# Patient Record
Sex: Female | Born: 1965 | Race: Black or African American | Hispanic: No | Marital: Single | Smoking: Current every day smoker
Health system: Southern US, Community
[De-identification: ages and names within clinical notes are randomized; demographics above are authoritative.]

## PROBLEM LIST (undated history)

## (undated) DIAGNOSIS — N393 Stress incontinence (female) (male): Secondary | ICD-10-CM

## (undated) DIAGNOSIS — Z720 Tobacco use: Secondary | ICD-10-CM

## (undated) DIAGNOSIS — I10 Essential (primary) hypertension: Secondary | ICD-10-CM

## (undated) DIAGNOSIS — J302 Other seasonal allergic rhinitis: Secondary | ICD-10-CM

## (undated) DIAGNOSIS — J45909 Unspecified asthma, uncomplicated: Secondary | ICD-10-CM

## (undated) HISTORY — DX: Tobacco use: Z72.0

## (undated) HISTORY — DX: Other seasonal allergic rhinitis: J30.2

## (undated) HISTORY — DX: Stress incontinence (female) (male): N39.3

## (undated) HISTORY — DX: Unspecified asthma, uncomplicated: J45.909

## (undated) HISTORY — DX: Essential (primary) hypertension: I10

---

## 1999-11-06 ENCOUNTER — Inpatient Hospital Stay (HOSPITAL_COMMUNITY): Admission: AD | Admit: 1999-11-06 | Discharge: 1999-11-06 | Payer: Self-pay | Admitting: *Deleted

## 1999-11-06 ENCOUNTER — Encounter (INDEPENDENT_AMBULATORY_CARE_PROVIDER_SITE_OTHER): Payer: Self-pay | Admitting: Specialist

## 1999-11-08 ENCOUNTER — Inpatient Hospital Stay (HOSPITAL_COMMUNITY): Admission: AD | Admit: 1999-11-08 | Discharge: 1999-11-08 | Payer: Self-pay | Admitting: Obstetrics & Gynecology

## 1999-11-23 ENCOUNTER — Inpatient Hospital Stay (HOSPITAL_COMMUNITY): Admission: AD | Admit: 1999-11-23 | Discharge: 1999-11-23 | Payer: Self-pay | Admitting: *Deleted

## 2007-04-08 ENCOUNTER — Ambulatory Visit: Payer: Self-pay | Admitting: Internal Medicine

## 2007-04-08 ENCOUNTER — Encounter (INDEPENDENT_AMBULATORY_CARE_PROVIDER_SITE_OTHER): Payer: Self-pay | Admitting: Nurse Practitioner

## 2007-04-08 LAB — CONVERTED CEMR LAB
ALT: 28 units/L (ref 0–35)
AST: 23 units/L (ref 0–37)
Basophils Absolute: 0 10*3/uL (ref 0.0–0.1)
Basophils Relative: 1 % (ref 0–1)
Chloride: 103 meq/L (ref 96–112)
Creatinine, Ser: 0.75 mg/dL (ref 0.40–1.20)
Eosinophils Relative: 2 % (ref 0–5)
Hemoglobin: 14.1 g/dL (ref 12.0–15.0)
MCHC: 32.8 g/dL (ref 30.0–36.0)
Monocytes Absolute: 0.5 10*3/uL (ref 0.1–1.0)
Neutro Abs: 4 10*3/uL (ref 1.7–7.7)
Platelets: 404 10*3/uL — ABNORMAL HIGH (ref 150–400)
RDW: 14.9 % (ref 11.5–15.5)
Sodium: 141 meq/L (ref 135–145)
Total Bilirubin: 0.3 mg/dL (ref 0.3–1.2)
Total Protein: 7.9 g/dL (ref 6.0–8.3)

## 2007-04-22 ENCOUNTER — Ambulatory Visit (HOSPITAL_COMMUNITY): Admission: RE | Admit: 2007-04-22 | Discharge: 2007-04-22 | Payer: Self-pay | Admitting: Family Medicine

## 2007-05-12 ENCOUNTER — Ambulatory Visit: Payer: Self-pay | Admitting: Family Medicine

## 2007-07-29 ENCOUNTER — Other Ambulatory Visit: Admission: RE | Admit: 2007-07-29 | Discharge: 2007-07-29 | Payer: Self-pay | Admitting: Internal Medicine

## 2007-07-29 ENCOUNTER — Encounter: Payer: Self-pay | Admitting: Internal Medicine

## 2007-07-29 ENCOUNTER — Ambulatory Visit: Payer: Self-pay | Admitting: Internal Medicine

## 2007-08-04 ENCOUNTER — Ambulatory Visit: Payer: Self-pay | Admitting: Internal Medicine

## 2007-10-26 ENCOUNTER — Ambulatory Visit: Payer: Self-pay | Admitting: Internal Medicine

## 2007-10-30 ENCOUNTER — Ambulatory Visit: Payer: Self-pay | Admitting: Family Medicine

## 2007-12-07 ENCOUNTER — Ambulatory Visit: Payer: Self-pay | Admitting: Internal Medicine

## 2008-11-15 ENCOUNTER — Encounter (INDEPENDENT_AMBULATORY_CARE_PROVIDER_SITE_OTHER): Payer: Self-pay | Admitting: Internal Medicine

## 2008-11-15 ENCOUNTER — Ambulatory Visit: Payer: Self-pay | Admitting: Internal Medicine

## 2008-11-15 ENCOUNTER — Other Ambulatory Visit: Admission: RE | Admit: 2008-11-15 | Discharge: 2008-11-15 | Payer: Self-pay | Admitting: Internal Medicine

## 2008-11-15 LAB — CONVERTED CEMR LAB
ALT: 18 units/L (ref 0–35)
BUN: 15 mg/dL (ref 6–23)
CO2: 24 meq/L (ref 19–32)
Calcium: 9.4 mg/dL (ref 8.4–10.5)
Chloride: 99 meq/L (ref 96–112)
Creatinine, Ser: 0.69 mg/dL (ref 0.40–1.20)
Eosinophils Absolute: 0.3 10*3/uL (ref 0.0–0.7)
Eosinophils Relative: 3 % (ref 0–5)
Glucose, Bld: 91 mg/dL (ref 70–99)
HCT: 39.3 % (ref 36.0–46.0)
Hemoglobin: 13.1 g/dL (ref 12.0–15.0)
Lymphs Abs: 2.9 10*3/uL (ref 0.7–4.0)
MCV: 83.6 fL (ref 78.0–100.0)
Monocytes Absolute: 0.7 10*3/uL (ref 0.1–1.0)
Monocytes Relative: 7 % (ref 3–12)
RBC: 4.7 M/uL (ref 3.87–5.11)
WBC: 10.4 10*3/uL (ref 4.0–10.5)

## 2008-11-16 ENCOUNTER — Encounter (INDEPENDENT_AMBULATORY_CARE_PROVIDER_SITE_OTHER): Payer: Self-pay | Admitting: Internal Medicine

## 2008-12-06 ENCOUNTER — Ambulatory Visit (HOSPITAL_COMMUNITY): Admission: RE | Admit: 2008-12-06 | Discharge: 2008-12-06 | Payer: Self-pay | Admitting: Internal Medicine

## 2008-12-07 ENCOUNTER — Ambulatory Visit: Payer: Self-pay | Admitting: Internal Medicine

## 2009-02-09 ENCOUNTER — Ambulatory Visit: Payer: Self-pay | Admitting: Family Medicine

## 2009-03-08 ENCOUNTER — Ambulatory Visit: Payer: Self-pay | Admitting: Family Medicine

## 2009-03-08 ENCOUNTER — Encounter (INDEPENDENT_AMBULATORY_CARE_PROVIDER_SITE_OTHER): Payer: Self-pay | Admitting: Internal Medicine

## 2009-03-08 LAB — CONVERTED CEMR LAB
Cholesterol: 217 mg/dL — ABNORMAL HIGH (ref 0–200)
Total CHOL/HDL Ratio: 6

## 2009-03-13 ENCOUNTER — Ambulatory Visit: Payer: Self-pay | Admitting: Internal Medicine

## 2009-05-16 ENCOUNTER — Ambulatory Visit: Payer: Self-pay | Admitting: Internal Medicine

## 2009-06-09 ENCOUNTER — Ambulatory Visit: Payer: Self-pay | Admitting: Internal Medicine

## 2010-03-02 ENCOUNTER — Other Ambulatory Visit (HOSPITAL_COMMUNITY): Payer: Self-pay | Admitting: Family Medicine

## 2010-03-02 DIAGNOSIS — Z1231 Encounter for screening mammogram for malignant neoplasm of breast: Secondary | ICD-10-CM

## 2010-03-20 ENCOUNTER — Ambulatory Visit (HOSPITAL_COMMUNITY): Payer: Medicaid Other

## 2010-03-28 ENCOUNTER — Ambulatory Visit (HOSPITAL_COMMUNITY): Payer: Medicaid Other

## 2010-04-05 ENCOUNTER — Ambulatory Visit (HOSPITAL_COMMUNITY)
Admission: RE | Admit: 2010-04-05 | Discharge: 2010-04-05 | Payer: Medicaid Other | Source: Ambulatory Visit | Attending: Family Medicine | Admitting: Family Medicine

## 2010-08-03 ENCOUNTER — Other Ambulatory Visit: Payer: Self-pay | Admitting: Family Medicine

## 2010-08-15 ENCOUNTER — Ambulatory Visit (HOSPITAL_COMMUNITY): Payer: Medicaid Other | Attending: Family Medicine

## 2010-09-20 ENCOUNTER — Ambulatory Visit (HOSPITAL_COMMUNITY): Payer: Medicaid Other

## 2010-09-28 ENCOUNTER — Ambulatory Visit (HOSPITAL_COMMUNITY): Payer: Medicaid Other

## 2010-10-03 ENCOUNTER — Ambulatory Visit (HOSPITAL_COMMUNITY)
Admission: RE | Admit: 2010-10-03 | Discharge: 2010-10-03 | Disposition: A | Discharge status: Home / Self Care | Payer: Medicaid Other | Source: Ambulatory Visit | Attending: Family Medicine | Admitting: Family Medicine

## 2010-10-03 DIAGNOSIS — Z1231 Encounter for screening mammogram for malignant neoplasm of breast: Secondary | ICD-10-CM

## 2012-01-10 ENCOUNTER — Other Ambulatory Visit: Payer: Self-pay | Admitting: Internal Medicine

## 2012-01-10 DIAGNOSIS — Z1231 Encounter for screening mammogram for malignant neoplasm of breast: Secondary | ICD-10-CM

## 2012-02-10 ENCOUNTER — Ambulatory Visit: Payer: Medicaid Other

## 2012-03-12 ENCOUNTER — Ambulatory Visit: Payer: Medicaid Other

## 2012-03-15 ENCOUNTER — Emergency Department (HOSPITAL_COMMUNITY)
Admission: EM | Admit: 2012-03-15 | Discharge: 2012-03-15 | Disposition: A | Discharge status: Home / Self Care | Payer: No Typology Code available for payment source | Attending: Emergency Medicine | Admitting: Emergency Medicine

## 2012-03-15 ENCOUNTER — Encounter (HOSPITAL_COMMUNITY): Payer: Self-pay | Admitting: Emergency Medicine

## 2012-03-15 DIAGNOSIS — Y929 Unspecified place or not applicable: Secondary | ICD-10-CM | POA: Insufficient documentation

## 2012-03-15 DIAGNOSIS — I10 Essential (primary) hypertension: Secondary | ICD-10-CM | POA: Insufficient documentation

## 2012-03-15 DIAGNOSIS — Z79899 Other long term (current) drug therapy: Secondary | ICD-10-CM | POA: Insufficient documentation

## 2012-03-15 DIAGNOSIS — IMO0002 Reserved for concepts with insufficient information to code with codable children: Secondary | ICD-10-CM | POA: Insufficient documentation

## 2012-03-15 DIAGNOSIS — F17200 Nicotine dependence, unspecified, uncomplicated: Secondary | ICD-10-CM | POA: Insufficient documentation

## 2012-03-15 DIAGNOSIS — S79929A Unspecified injury of unspecified thigh, initial encounter: Secondary | ICD-10-CM | POA: Insufficient documentation

## 2012-03-15 DIAGNOSIS — Y9389 Activity, other specified: Secondary | ICD-10-CM | POA: Insufficient documentation

## 2012-03-15 DIAGNOSIS — S79919A Unspecified injury of unspecified hip, initial encounter: Secondary | ICD-10-CM | POA: Insufficient documentation

## 2012-03-15 HISTORY — DX: Essential (primary) hypertension: I10

## 2012-03-15 MED ORDER — METHOCARBAMOL 500 MG PO TABS: 1000.0000 mg | ORAL_TABLET | Freq: Four times a day (QID) | ORAL | Status: DC
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
## 2012-03-17 ENCOUNTER — Ambulatory Visit: Payer: Medicaid Other | Admitting: Internal Medicine

## 2012-03-17 NOTE — ED Provider Notes (Signed)
Medical screening examination/treatment/procedure(s) were performed by non-physician practitioner and as supervising physician I was immediately available for consultation/collaboration.  Hurman Horn, MD 03/17/12 [phone]

## 2012-03-30 ENCOUNTER — Ambulatory Visit (INDEPENDENT_AMBULATORY_CARE_PROVIDER_SITE_OTHER): Payer: Medicaid Other | Admitting: Internal Medicine

## 2012-03-30 ENCOUNTER — Encounter: Payer: Self-pay | Admitting: Internal Medicine

## 2012-03-30 VITALS — BP 130/73 | HR 100 | Temp 97.8°F | Wt 248.2 lb

## 2012-03-30 DIAGNOSIS — F17200 Nicotine dependence, unspecified, uncomplicated: Secondary | ICD-10-CM

## 2012-03-30 DIAGNOSIS — E785 Hyperlipidemia, unspecified: Secondary | ICD-10-CM

## 2012-03-30 DIAGNOSIS — I10 Essential (primary) hypertension: Secondary | ICD-10-CM

## 2012-03-30 DIAGNOSIS — J309 Allergic rhinitis, unspecified: Secondary | ICD-10-CM

## 2012-03-30 DIAGNOSIS — J45909 Unspecified asthma, uncomplicated: Secondary | ICD-10-CM

## 2012-03-30 DIAGNOSIS — N393 Stress incontinence (female) (male): Secondary | ICD-10-CM

## 2012-03-30 DIAGNOSIS — F17210 Nicotine dependence, cigarettes, uncomplicated: Secondary | ICD-10-CM | POA: Insufficient documentation

## 2012-03-30 DIAGNOSIS — Z720 Tobacco use: Secondary | ICD-10-CM

## 2012-03-30 DIAGNOSIS — J302 Other seasonal allergic rhinitis: Secondary | ICD-10-CM

## 2012-03-30 HISTORY — DX: Tobacco use: Z72.0

## 2012-03-30 HISTORY — DX: Other seasonal allergic rhinitis: J30.2

## 2012-03-30 HISTORY — DX: Stress incontinence (female) (male): N39.3

## 2012-03-30 HISTORY — DX: Essential (primary) hypertension: I10

## 2012-03-30 HISTORY — DX: Unspecified asthma, uncomplicated: J45.909

## 2012-03-30 LAB — LIPID PANEL
Cholesterol: 172 mg/dL (ref 0–200)
Total CHOL/HDL Ratio: 5.4 Ratio
VLDL: 26 mg/dL (ref 0–40)

## 2012-03-30 LAB — CBC
Hemoglobin: 12.4 g/dL (ref 12.0–15.0)
MCHC: 32.4 g/dL (ref 30.0–36.0)
RDW: 17.2 % — ABNORMAL HIGH (ref 11.5–15.5)

## 2012-03-30 LAB — BASIC METABOLIC PANEL WITH GFR
BUN: 10 mg/dL (ref 6–23)
Creat: 0.69 mg/dL (ref 0.50–1.10)
GFR, Est African American: >89 mL/min
GFR, Est Non African American: >89 mL/min
Potassium: 4.5 mEq/L (ref 3.5–5.3)

## 2012-03-30 MED ORDER — HYDROCODONE-ACETAMINOPHEN 5-325 MG PO TABS: 1.0000 | ORAL_TABLET | Freq: Every evening | ORAL | Status: DC | PRN

## 2012-03-30 MED ORDER — TRAMADOL HCL 50 MG PO TABS: 50.0000 mg | ORAL_TABLET | Freq: Three times a day (TID) | ORAL | Status: DC

## 2012-03-30 NOTE — Assessment & Plan Note (Signed)
Breathing is well controlled at this time with symbicort daily and albuterol prn. Consider PFTs in the future.

## 2012-03-30 NOTE — Progress Notes (Signed)
Subjective:     Patient ID: Crystal Briggs, female   DOB: 12/21/1965, 47 y.o.   MRN: [id number]  HPI The patient is new to this clinic and has PMH of asthma, HTN, hyperlipidemia, stress incontinence, seasonal allergies, and half pack per day smoking history. She states that she is doing well overall. She does have some chronic back pain and uses tramadol 3 times per day to control the pain. She does also take 1 vicodin at bedtime so she can sleep well. Her breathing is good and she occasionally uses the albuterol to help when she is exerting herself. She thinks the smoking may have made her breathing worse over the years as her asthma was diagnosed in the last 10 years. She had flu shot already this season. She had tetanus shot last year (Dec 2012). She is not having any problems with chest pain and has no heart history. She is not having any weight changes. She does not exercise on a regular basis. She does request handicapped sticker at today's visit.   Family medical history, surgical history, allergies, medical history, social history, medications were all reviewed and updated at this visit.  Review of Systems  Constitutional: Negative for fever, chills, diaphoresis, activity change, appetite change, fatigue and unexpected weight change.  HENT: Negative.   Eyes: Negative.   Respiratory: Negative for apnea, cough, choking, chest tightness, shortness of breath, wheezing and stridor.   Cardiovascular: Negative for chest pain, palpitations and leg swelling.  Gastrointestinal: Negative for nausea, vomiting, abdominal pain, diarrhea, constipation, blood in stool and abdominal distention.  Endocrine: Negative for cold intolerance, heat intolerance, polydipsia, polyphagia and polyuria.  Genitourinary: Positive for frequency. Negative for dysuria, urgency, hematuria, flank pain, decreased urine volume, vaginal bleeding, vaginal discharge, enuresis, difficulty urinating, genital sores, vaginal pain,  menstrual problem, pelvic pain and dyspareunia.  Musculoskeletal: Positive for back pain. Negative for myalgias, joint swelling, arthralgias and gait problem.  Skin: Negative for color change, pallor, rash and wound.  Allergic/Immunologic: Positive for environmental allergies. Negative for food allergies and immunocompromised state.  Neurological: Negative for dizziness, tremors, seizures, syncope, facial asymmetry, speech difficulty, weakness, light-headedness, numbness and headaches.  Psychiatric/Behavioral: Negative for suicidal ideas, hallucinations, behavioral problems, confusion, sleep disturbance, self-injury, dysphoric mood, decreased concentration and agitation. The patient is not nervous/anxious and is not hyperactive.        Objective:   Physical Exam  Constitutional: She is oriented to person, place, and time. She appears well-developed and well-nourished.  Obese  HENT:  Head: Normocephalic and atraumatic.  Eyes: EOM are normal. Pupils are equal, round, and reactive to light.  Neck: Normal range of motion. Neck supple. No JVD present. No tracheal deviation present. No thyromegaly present.  Cardiovascular: Normal rate and normal heart sounds.   No murmur heard. Pulmonary/Chest: Effort normal and breath sounds normal. No respiratory distress. She has no wheezes. She has no rales. She exhibits no tenderness.  Abdominal: Soft. Bowel sounds are normal. She exhibits no distension. There is no tenderness. There is no rebound and no guarding.  Musculoskeletal: Normal range of motion. She exhibits tenderness. She exhibits no edema.  Tenderness in the lumbar region.  Lymphadenopathy:    She has no cervical adenopathy.  Neurological: She is alert and oriented to person, place, and time. No cranial nerve deficit.  Skin: Skin is warm and dry. No rash noted. No erythema. No pallor.       Assessment/Plan:   1. Please see problem oriented charting.  2. Disposition -  The patient was  given refill for tramadol and vicodin 5/325 #30 no refill today. She will be seen back in 3 months and can be given refill on vicodin until seen back. Otherwise no changes to medications at today's visit. CBC, lipid, BMP drawn at today's visit. Handicapped sticker was denied at today's visit and explained to the patient that I would rather she walk to get some exercise as long as she is able.

## 2012-03-30 NOTE — Assessment & Plan Note (Signed)
Uses flonase and zyrtec for her allergies and uses them year-round. Reasonably well controlled.

## 2012-03-30 NOTE — Assessment & Plan Note (Signed)
She does use 1/2 PPD and has for some time. She did quit one time for 2 weeks. She does not have any desire to quit at this time although she does understand that this is adversely affecting her breathing.

## 2012-03-30 NOTE — Assessment & Plan Note (Signed)
Patient uses ditropan QID and gets some benefit. She states that if she misses a dose she is going all the time and especially at night time.

## 2012-03-30 NOTE — Patient Instructions (Addendum)
We have refilled your pain medicine and will have you come back in 3 months to check on how you are doing.  If you have problems with your breathing or health before then feel free to call our office at [phone]. Your new doctor's name is Dr. Dorise Hiss.  We will call you with your blood tests if there are any problems.   Work on increasing your exercise, if your breathing is short take a break.  Consider stopping smoking.

## 2012-03-30 NOTE — Assessment & Plan Note (Signed)
Will check lipid panel today. On lovastatin which is new medicine (<1 month).

## 2012-03-30 NOTE — Assessment & Plan Note (Signed)
Blood pressure well controlled today on only amlodipine. Will continue to monitor and check BMP today.

## 2012-04-28 ENCOUNTER — Other Ambulatory Visit: Payer: Self-pay | Admitting: Internal Medicine

## 2012-04-29 ENCOUNTER — Other Ambulatory Visit: Payer: Self-pay | Admitting: Internal Medicine

## 2012-04-29 NOTE — Telephone Encounter (Signed)
Called to pharm 

## 2012-05-26 ENCOUNTER — Other Ambulatory Visit: Payer: Self-pay | Admitting: Internal Medicine

## 2012-05-28 NOTE — Telephone Encounter (Signed)
Hydrocodone 5/325mg  rx called to [facility].

## 2012-06-26 ENCOUNTER — Other Ambulatory Visit: Payer: Self-pay | Admitting: Internal Medicine

## 2012-06-29 ENCOUNTER — Other Ambulatory Visit: Payer: Self-pay | Admitting: Internal Medicine

## 2012-06-30 NOTE — Telephone Encounter (Signed)
Called to pharm 

## 2012-07-03 ENCOUNTER — Encounter: Payer: Self-pay | Admitting: Internal Medicine

## 2012-07-03 ENCOUNTER — Ambulatory Visit (INDEPENDENT_AMBULATORY_CARE_PROVIDER_SITE_OTHER): Payer: Medicaid Other | Admitting: Internal Medicine

## 2012-07-03 VITALS — BP 134/78 | HR 100 | Temp 99.3°F | Wt 241.8 lb

## 2012-07-03 DIAGNOSIS — IMO0001 Reserved for inherently not codable concepts without codable children: Secondary | ICD-10-CM

## 2012-07-03 DIAGNOSIS — J4530 Mild persistent asthma, uncomplicated: Secondary | ICD-10-CM

## 2012-07-03 DIAGNOSIS — F17200 Nicotine dependence, unspecified, uncomplicated: Secondary | ICD-10-CM

## 2012-07-03 DIAGNOSIS — J45909 Unspecified asthma, uncomplicated: Secondary | ICD-10-CM

## 2012-07-03 DIAGNOSIS — I10 Essential (primary) hypertension: Secondary | ICD-10-CM

## 2012-07-03 DIAGNOSIS — Z720 Tobacco use: Secondary | ICD-10-CM

## 2012-07-03 DIAGNOSIS — Z789 Other specified health status: Secondary | ICD-10-CM

## 2012-07-03 MED ORDER — HYDROCODONE-ACETAMINOPHEN 5-325 MG PO TABS: ORAL_TABLET | ORAL | Status: DC

## 2012-07-03 MED ORDER — NICOTINE 14 MG/24HR TD PT24: 1.0000 | MEDICATED_PATCH | TRANSDERMAL | Status: DC

## 2012-07-03 MED ORDER — MONTELUKAST SODIUM 10 MG PO TABS: 10.0000 mg | ORAL_TABLET | Freq: Every day | ORAL | Status: DC

## 2012-07-03 NOTE — Assessment & Plan Note (Signed)
  Assessment: Progress toward smoking cessation:  smoking more Barriers to progress toward smoking cessation:  lack of motivation to quit Comments:   Plan: Instruction/counseling given:  I counseled patient on the dangers of tobacco use, advised patient to stop smoking, and reviewed strategies to maximize success. Educational resources provided:  QuitlineNC [employer]er, [employer]) brochure Self management tools provided:  smoking cessation plan (STAR Quit Plan) Medications to assist with smoking cessation:  Nicotine Patch Patient agreed to the following self-care plans for smoking cessation: go to the [employer] ([email or web address])  Other plans:

## 2012-07-03 NOTE — Assessment & Plan Note (Signed)
Per patient is having more symptoms with allergies and will add montelukast daily. Will refer for PFTs and keep symbicort and albuterol prn.

## 2012-07-03 NOTE — Progress Notes (Signed)
Subjective:     Patient ID: Crystal Briggs, female   DOB: Jul 10, 1965, 47 y.o.   MRN: [id number]  HPI The patient is coming back to this clinic and has PMH of asthma, HTN, hyperlipidemia, stress incontinence, seasonal allergies, and half pack per day smoking history. She states that she is doing well overall. She does have some chronic back pain and uses vicodin 2 times per day for this pain. She uses her albuterol several times per week and uses her symbicort daily to help with the breathing. She also notices that the allergy season makes her breathing worse. She is not having any problems with chest pain and has no heart history. She is not having any weight changes. She does not exercise on a regular basis.   Review of Systems  Constitutional: Negative for fever, chills, diaphoresis, activity change, appetite change, fatigue and unexpected weight change.  HENT: Negative.   Eyes: Negative.   Respiratory: Negative for apnea, cough, choking, chest tightness, shortness of breath, wheezing and stridor.   Cardiovascular: Negative for chest pain, palpitations and leg swelling.  Gastrointestinal: Negative for nausea, vomiting, abdominal pain, diarrhea, constipation, blood in stool and abdominal distention.  Endocrine: Negative for cold intolerance, heat intolerance, polydipsia, polyphagia and polyuria.  Genitourinary: Positive for frequency. Negative for dysuria, urgency, hematuria, flank pain, decreased urine volume, vaginal bleeding, vaginal discharge, enuresis, difficulty urinating, genital sores, vaginal pain, menstrual problem, pelvic pain and dyspareunia.  Musculoskeletal: Positive for back pain. Negative for myalgias, joint swelling, arthralgias and gait problem.  Skin: Negative for color change, pallor, rash and wound.  Allergic/Immunologic: Positive for environmental allergies. Negative for food allergies and immunocompromised state.  Neurological: Negative for dizziness, tremors, seizures,  syncope, facial asymmetry, speech difficulty, weakness, light-headedness, numbness and headaches.  Psychiatric/Behavioral: Negative for suicidal ideas, hallucinations, behavioral problems, confusion, sleep disturbance, self-injury, dysphoric mood, decreased concentration and agitation. The patient is not nervous/anxious and is not hyperactive.        Objective:   Physical Exam  Constitutional: She is oriented to person, place, and time. She appears well-developed and well-nourished.  Obese  HENT:  Head: Normocephalic and atraumatic.  Eyes: EOM are normal. Pupils are equal, round, and reactive to light.  Neck: Normal range of motion. Neck supple. No JVD present. No tracheal deviation present. No thyromegaly present.  Cardiovascular: Normal rate and normal heart sounds.   No murmur heard. Pulmonary/Chest: Effort normal and breath sounds normal. No respiratory distress. She has no wheezes. She has no rales. She exhibits no tenderness.  Abdominal: Soft. Bowel sounds are normal. She exhibits no distension. There is no tenderness. There is no rebound and no guarding.  Musculoskeletal: Normal range of motion. She exhibits tenderness. She exhibits no edema.  Tenderness in the lumbar region.  Lymphadenopathy:    She has no cervical adenopathy.  Neurological: She is alert and oriented to person, place, and time. No cranial nerve deficit.  Skin: Skin is warm and dry. No rash noted. No erythema. No pallor.      Assessment/Plan:   1. Please see problem oriented charting.  2. Disposition - The patient was given refill for vicodin 5/325 #30 no refill today. Signed pain contract for vicodin 5/325 #60 per month. Will need urine screening at next visit. Added montelukast for better asthma control. Referred for PFTs, mammogram. Advised to STOP smoking. Patient given quitline information and nicotine patches.

## 2012-07-03 NOTE — Progress Notes (Signed)
Case discussed with Dr. Kollar soon after the resident saw the patient.  We reviewed the resident's history and exam and pertinent patient test results.  I agree with the assessment, diagnosis, and plan of care documented in the resident's note. 

## 2012-07-03 NOTE — Patient Instructions (Signed)
General Instructions:  We are going to get a test of your lungs and a screening mammogram.   STOP smoking to help your breathing. We will give you information about the quit line and also sent some nicotine patches to your pharmacy.  For your breathing we will add a medicine called montelukast (singulair). Take 1 pill per day to help control your symptoms.   We will increase your pain medicine to 2 times per day. STOP taking tramadol and use hydrocodone in the morning and in the evening as needed.   Come back in 3-6 months or as needed. Call with questions or problems at [phone].  Treatment Goals:  Goals (1 Years of Data) as of 07/03/12         As of Today 03/30/12 03/15/12 03/15/12     Blood Pressure    . Blood Pressure < 140/90  134/78 130/73 122/78 178/83      Progress Toward Treatment Goals:  Treatment Goal 07/03/2012  Blood pressure at goal  Stop smoking smoking more    Self Care Goals & Plans:  Self Care Goal 07/03/2012  Manage my medications take my medicines as prescribed  Monitor my health keep track of my weight  Eat healthy foods eat more vegetables  Be physically active find an activity I enjoy  Stop smoking go to the [employer] ([email or web address])  Meeting treatment goals maintain the current self-care plan       Care Management & Community Referrals:  Referral 07/03/2012  Referrals made for care management support none needed

## 2012-07-03 NOTE — Assessment & Plan Note (Signed)
BP Readings from Last 3 Encounters:  07/03/12 134/78  03/30/12 130/73  03/15/12 122/78    Lab Results  Component Value Date   NA 136 03/30/2012   K 4.5 03/30/2012   CREATININE 0.69 03/30/2012    Assessment: Blood pressure control: controlled Progress toward BP goal:  at goal Comments:   Plan: Medications:  continue current medications, amlodipine 10 mg daily Educational resources provided: video Self management tools provided: instructions for home blood pressure monitoring Other plans:

## 2012-07-10 ENCOUNTER — Ambulatory Visit (HOSPITAL_COMMUNITY)
Admission: RE | Admit: 2012-07-10 | Discharge: 2012-07-10 | Disposition: A | Discharge status: Home / Self Care | Payer: No Typology Code available for payment source | Source: Ambulatory Visit | Attending: Internal Medicine | Admitting: Internal Medicine

## 2012-07-10 DIAGNOSIS — J4530 Mild persistent asthma, uncomplicated: Secondary | ICD-10-CM

## 2012-07-16 ENCOUNTER — Ambulatory Visit (HOSPITAL_COMMUNITY): Payer: Medicaid Other

## 2012-07-16 ENCOUNTER — Encounter (HOSPITAL_COMMUNITY): Payer: Medicaid Other

## 2012-07-24 ENCOUNTER — Ambulatory Visit (HOSPITAL_COMMUNITY)
Admission: RE | Admit: 2012-07-24 | Discharge: 2012-07-24 | Disposition: A | Discharge status: Home / Self Care | Payer: Medicaid Other | Source: Ambulatory Visit | Attending: Internal Medicine | Admitting: Internal Medicine

## 2012-07-24 DIAGNOSIS — J45909 Unspecified asthma, uncomplicated: Secondary | ICD-10-CM | POA: Insufficient documentation

## 2012-07-24 MED ORDER — ALBUTEROL SULFATE (5 MG/ML) 0.5% IN NEBU
2.5000 mg | INHALATION_SOLUTION | Freq: Once | RESPIRATORY_TRACT | Status: AC
  Administered 2012-07-24: 2.5 mg via RESPIRATORY_TRACT

## 2012-07-28 ENCOUNTER — Other Ambulatory Visit: Payer: Self-pay | Admitting: *Deleted

## 2012-07-28 NOTE — Telephone Encounter (Signed)
Last refill 6/20

## 2012-07-29 ENCOUNTER — Ambulatory Visit (HOSPITAL_COMMUNITY)
Admission: RE | Admit: 2012-07-29 | Discharge: 2012-07-29 | Disposition: A | Discharge status: Home / Self Care | Payer: Medicaid Other | Source: Ambulatory Visit | Attending: Internal Medicine | Admitting: Internal Medicine

## 2012-07-29 DIAGNOSIS — Z1231 Encounter for screening mammogram for malignant neoplasm of breast: Secondary | ICD-10-CM | POA: Insufficient documentation

## 2012-07-29 DIAGNOSIS — IMO0001 Reserved for inherently not codable concepts without codable children: Secondary | ICD-10-CM

## 2012-07-29 MED ORDER — HYDROCODONE-ACETAMINOPHEN 5-325 MG PO TABS: ORAL_TABLET | ORAL | Status: DC

## 2012-07-29 NOTE — Telephone Encounter (Signed)
Rx called in 

## 2012-08-27 ENCOUNTER — Other Ambulatory Visit: Payer: Self-pay | Admitting: *Deleted

## 2012-08-27 ENCOUNTER — Other Ambulatory Visit: Payer: Self-pay | Admitting: Internal Medicine

## 2012-08-27 MED ORDER — HYDROCODONE-ACETAMINOPHEN 5-325 MG PO TABS: 1.0000 | ORAL_TABLET | Freq: Two times a day (BID) | ORAL | Status: DC | PRN

## 2012-08-27 NOTE — Telephone Encounter (Signed)
Called to pharm 

## 2012-08-27 NOTE — Telephone Encounter (Signed)
Refill approved - nurse to complete. 

## 2012-09-15 ENCOUNTER — Other Ambulatory Visit: Payer: Self-pay | Admitting: Internal Medicine

## 2012-09-28 ENCOUNTER — Other Ambulatory Visit: Payer: Self-pay | Admitting: Internal Medicine

## 2012-09-28 MED ORDER — HYDROCODONE-ACETAMINOPHEN 5-325 MG PO TABS: 1.0000 | ORAL_TABLET | Freq: Two times a day (BID) | ORAL | Status: DC | PRN

## 2012-09-28 NOTE — Telephone Encounter (Signed)
Rx called in to pharmacy. Has 2 refills on med - written - in folder of Rx. Pt aware. Stanton Kidney Lyniah Fujita RN 09/28/12 3PM

## 2012-10-27 ENCOUNTER — Other Ambulatory Visit: Payer: Self-pay | Admitting: *Deleted

## 2012-10-28 MED ORDER — HYDROCODONE-ACETAMINOPHEN 5-325 MG PO TABS: 1.0000 | ORAL_TABLET | Freq: Two times a day (BID) | ORAL | Status: DC | PRN

## 2012-10-28 NOTE — Telephone Encounter (Signed)
Signed and placed in the medication room in clinic.  Thanks,  Dr. Dorise Hiss

## 2012-10-29 NOTE — Telephone Encounter (Signed)
Spoke w/ pt she will pick up script today

## 2012-11-16 ENCOUNTER — Other Ambulatory Visit (HOSPITAL_COMMUNITY)
Admission: RE | Admit: 2012-11-16 | Discharge: 2012-11-16 | Disposition: A | Discharge status: Home / Self Care | Payer: Medicaid Other | Source: Ambulatory Visit | Attending: Internal Medicine | Admitting: Internal Medicine

## 2012-11-16 ENCOUNTER — Encounter: Payer: Self-pay | Admitting: Internal Medicine

## 2012-11-16 ENCOUNTER — Ambulatory Visit (INDEPENDENT_AMBULATORY_CARE_PROVIDER_SITE_OTHER): Payer: Medicaid Other | Admitting: Internal Medicine

## 2012-11-16 ENCOUNTER — Ambulatory Visit: Payer: Medicaid Other | Admitting: Internal Medicine

## 2012-11-16 VITALS — BP 142/89 | HR 93 | Temp 98.6°F | Ht 60.0 in | Wt 238.1 lb

## 2012-11-16 DIAGNOSIS — Z113 Encounter for screening for infections with a predominantly sexual mode of transmission: Secondary | ICD-10-CM | POA: Insufficient documentation

## 2012-11-16 DIAGNOSIS — Z23 Encounter for immunization: Secondary | ICD-10-CM

## 2012-11-16 DIAGNOSIS — N949 Unspecified condition associated with female genital organs and menstrual cycle: Secondary | ICD-10-CM

## 2012-11-16 DIAGNOSIS — G894 Chronic pain syndrome: Secondary | ICD-10-CM

## 2012-11-16 DIAGNOSIS — N898 Other specified noninflammatory disorders of vagina: Secondary | ICD-10-CM

## 2012-11-16 DIAGNOSIS — N760 Acute vaginitis: Secondary | ICD-10-CM | POA: Insufficient documentation

## 2012-11-16 LAB — POCT URINALYSIS DIPSTICK
Bilirubin, UA: NEGATIVE
Glucose, UA: NEGATIVE
Ketones, UA: NEGATIVE
Leukocytes, UA: NEGATIVE
Nitrite, UA: NEGATIVE

## 2012-11-16 MED ORDER — HYDROCODONE-ACETAMINOPHEN 5-325 MG PO TABS: 1.0000 | ORAL_TABLET | Freq: Two times a day (BID) | ORAL | Status: DC | PRN

## 2012-11-16 NOTE — Progress Notes (Signed)
Subjective:     Patient ID: Crystal Briggs, female   DOB: March 05, 1965, 47 y.o.   MRN: [id number]  HPI The patient is coming in for an acute visit for vaginal odor. The odor is not present all the time but only occasionally and she first noticed it before her last period. She cannot describe the odor but states that it does not smell like urine or fishy. She denies discharge or burning. She denies urinary frequency or urgency. She changed the soap she is washing with to [employer] brand about 3 weeks ago. She denies itching regularly although has some every once in a while. She has not had any fevers or chills. She denies chest pain or SOB. She denies new sexual partner. She is still smoking and does not wish to quit at this time although she is smoking a little less.   Review of Systems  Constitutional: Negative.   HENT: Negative.   Respiratory: Negative for cough, chest tightness, shortness of breath and wheezing.   Cardiovascular: Negative for chest pain, palpitations and leg swelling.  Gastrointestinal: Negative.   Genitourinary: Positive for enuresis. Negative for dysuria, urgency, frequency, hematuria, flank pain, decreased urine volume, vaginal bleeding, vaginal discharge, difficulty urinating, genital sores, vaginal pain, menstrual problem, pelvic pain and dyspareunia.  Skin: Negative.   Psychiatric/Behavioral: Negative.        Objective:   Physical Exam  Constitutional: She is oriented to person, place, and time. She appears well-developed and well-nourished.  Obese  HENT:  Head: Normocephalic and atraumatic.  Eyes: EOM are normal. Pupils are equal, round, and reactive to light.  Neck: Normal range of motion. Neck supple. No JVD present.  Cardiovascular: Normal rate and regular rhythm.   No murmur heard. Pulmonary/Chest: Effort normal and breath sounds normal. No respiratory distress.  Abdominal: Soft. Bowel sounds are normal. She exhibits no distension and no mass. There is no  tenderness. There is no rebound and no guarding.  Genitourinary: Vagina normal. No vaginal discharge found.  Normal external genitalia without discharge or yeast, normal vaginal vault without discharge. No pain on manual exam. GC/Chlamydia and wet prep samples taken.   Musculoskeletal: Normal range of motion. She exhibits no edema and no tenderness.  Lymphadenopathy:    She has no cervical adenopathy.  Neurological: She is alert and oriented to person, place, and time.  Skin: Skin is warm and dry.       Assessment:   1. Vaginal odor - Advised the patient to stop using soap down there and to use warm water and gentle cleansing touch. Wet prep and GC/chlamydia sent today. U/A sample sent without signs of infection. Will defer treatment at this time due to no discharge and no noticeable odor on exam. Kegel exercises provided for some leaking of urine. She was also advised that some change in the vaginal flora can occur around menses. Her LMP was 11/06/12.  2. Disposition - Hydrocodone rx provided at today's visit # 60 no refills and not to be filled until 11/30/12. She will be seen back in 6 months in the clinic. No changes to medications at today's visit. Flu shot given at today's visit.

## 2012-11-16 NOTE — Patient Instructions (Addendum)
Your urine test did not show any signs of infection.   We have ordered some lab tests today and will call you with the results. I would recommend not using soap on your vaginal area but just warm water and gentle cleansing touch. Some odor is normal from time to time but if you have odor or discharge that lasts for several days please feel free to call us back. These exercises may help with urine passing at nighttime or leaking during the day. Practice these and work about 5-7 times per week to strengthen the muscles around the bladder.   Come back in about 6 months to see your regular doctor.  Call us with questions or problems before then at [phone].  Kegel Exercises The goal of Kegel exercises is to isolate and exercise your pelvic floor muscles. These muscles act as a hammock that supports the rectum, vagina, small intestine, and uterus. As the muscles weaken, the hammock sags and these organs are displaced from their normal positions. Kegel exercises can strengthen your pelvic floor muscles and help you to improve bladder and bowel control, improve sexual response, and help reduce many problems and some discomfort during pregnancy. Kegel exercises can be done anywhere and at any time. HOW TO PERFORM KEGEL EXERCISES 1. Locate your pelvic floor muscles. To do this, squeeze (contract) the muscles that you use when you try to stop the flow of urine. You will feel a tightness in the vaginal area (women) and a tight lift in the rectal area (men and women). 2. When you begin, contract your pelvic muscles tight for 2 5 seconds, then relax them for 2 5 seconds. This is one set. Do 4 5 sets with a short pause in between. 3. Contract your pelvic muscles for 8 10 seconds, then relax them for 8 10 seconds. Do 4 5 sets. If you cannot contract your pelvic muscles for 8 10 seconds, try 5 7 seconds and work your way up to 8 10 seconds. Your goal is 4 5 sets of 10 contractions each day. Keep your stomach,  buttocks, and legs relaxed during the exercises. Perform sets of both short and long contractions. Vary your positions. Perform these contractions 3 4 times per day. Perform sets while you are:   Lying in bed in the morning.  Standing at lunch.  Sitting in the late afternoon.  Lying in bed at night. You should do 40 50 contractions per day. Do not perform more Kegel exercises per day than recommended. Overexercising can cause muscle fatigue. Continue these exercises for for at least 15 20 weeks or as directed by your caregiver. Document Released: 12/18/2011 Document Reviewed: 09/26/2011 [facility] Patient Information 2014 [place], [place].

## 2012-11-16 NOTE — Progress Notes (Signed)
Case discussed with Dr. Kollar soon after the resident saw the patient.  We reviewed the resident's history and exam and pertinent patient test results.  I agree with the assessment, diagnosis, and plan of care documented in the resident's note. 

## 2012-11-17 LAB — PRESCRIPTION ABUSE MONITORING 15P, URINE
Amphetamine/Meth: NEGATIVE ng/mL
Barbiturate Screen, Urine: NEGATIVE ng/mL
Benzodiazepine Screen, Urine: NEGATIVE ng/mL
Buprenorphine, Urine: NEGATIVE ng/mL
Cannabinoid Scrn, Ur: NEGATIVE ng/mL
Carisoprodol, Urine: NEGATIVE ng/mL
Fentanyl, Ur: NEGATIVE ng/mL
Tramadol Scrn, Ur: NEGATIVE ng/mL
Zolpidem, Urine: NEGATIVE ng/mL

## 2012-11-18 ENCOUNTER — Other Ambulatory Visit: Payer: Self-pay | Admitting: Internal Medicine

## 2012-11-18 MED ORDER — METRONIDAZOLE 500 MG PO TABS: 500.0000 mg | ORAL_TABLET | Freq: Two times a day (BID) | ORAL | Status: DC

## 2012-12-03 ENCOUNTER — Other Ambulatory Visit: Payer: Self-pay | Admitting: *Deleted

## 2012-12-03 MED ORDER — ALBUTEROL SULFATE HFA 108 (90 BASE) MCG/ACT IN AERS: 2.0000 | INHALATION_SPRAY | Freq: Four times a day (QID) | RESPIRATORY_TRACT | Status: DC | PRN

## 2012-12-03 MED ORDER — BUDESONIDE-FORMOTEROL FUMARATE 160-4.5 MCG/ACT IN AERO: 2.0000 | INHALATION_SPRAY | Freq: Two times a day (BID) | RESPIRATORY_TRACT | Status: DC

## 2012-12-07 ENCOUNTER — Telehealth: Payer: Self-pay | Admitting: *Deleted

## 2012-12-07 NOTE — Telephone Encounter (Signed)
She did take all her flagyl as prescribed and yes she is willing to take again, appt cancelled

## 2012-12-07 NOTE — Telephone Encounter (Signed)
Did she take the metronidazole that was sent in for her? If not, she does not need appointment she needs to take that!

## 2012-12-07 NOTE — Telephone Encounter (Signed)
Pt states she continues to have a vaginal discharge with a foul odor, not as strong but still happening, appt  Set for 11/25 dr Dorise Hiss

## 2012-12-08 ENCOUNTER — Ambulatory Visit: Payer: Medicaid Other | Admitting: Internal Medicine

## 2012-12-08 MED ORDER — METRONIDAZOLE 500 MG PO TABS: 500.0000 mg | ORAL_TABLET | Freq: Two times a day (BID) | ORAL | Status: DC

## 2012-12-08 NOTE — Telephone Encounter (Signed)
Rx sent in again. Please remind she is not to drink alcohol while on this medication.   Thanks,  Dr. Dorise Hiss

## 2012-12-08 NOTE — Addendum Note (Signed)
Addended by: Genella Mech A on: 12/08/2012 08:05 AM   Modules accepted: Orders

## 2012-12-15 ENCOUNTER — Other Ambulatory Visit: Payer: Self-pay | Admitting: Internal Medicine

## 2012-12-23 ENCOUNTER — Other Ambulatory Visit: Payer: Self-pay | Admitting: *Deleted

## 2012-12-23 MED ORDER — OXYBUTYNIN CHLORIDE 5 MG PO TABS: 5.0000 mg | ORAL_TABLET | Freq: Four times a day (QID) | ORAL | Status: DC

## 2013-01-21 ENCOUNTER — Other Ambulatory Visit: Payer: Self-pay | Admitting: Internal Medicine

## 2013-01-21 ENCOUNTER — Other Ambulatory Visit: Payer: Self-pay | Admitting: *Deleted

## 2013-01-21 MED ORDER — HYDROCODONE-ACETAMINOPHEN 5-325 MG PO TABS: 1.0000 | ORAL_TABLET | Freq: Two times a day (BID) | ORAL | Status: DC | PRN

## 2013-01-22 ENCOUNTER — Other Ambulatory Visit: Payer: Self-pay | Admitting: *Deleted

## 2013-01-22 MED ORDER — AMLODIPINE BESYLATE 10 MG PO TABS: 10.0000 mg | ORAL_TABLET | Freq: Every day | ORAL | Status: DC

## 2013-01-22 NOTE — Telephone Encounter (Signed)
Pt informed Hydrocodone rxs ready to be picked up; pt stated not due until the 17th but needs her HTN med refilled.

## 2013-02-11 ENCOUNTER — Other Ambulatory Visit: Payer: Self-pay | Admitting: *Deleted

## 2013-02-12 MED ORDER — LOVASTATIN 20 MG PO TABS: 20.0000 mg | ORAL_TABLET | Freq: Every day | ORAL | Status: DC

## 2013-02-22 ENCOUNTER — Other Ambulatory Visit: Payer: Self-pay | Admitting: Internal Medicine

## 2013-03-29 ENCOUNTER — Other Ambulatory Visit: Payer: Self-pay | Admitting: *Deleted

## 2013-03-29 MED ORDER — FLUTICASONE PROPIONATE 50 MCG/ACT NA SUSP: 2.0000 | Freq: Every day | NASAL | Status: DC

## 2013-03-31 ENCOUNTER — Other Ambulatory Visit: Payer: Self-pay | Admitting: Internal Medicine

## 2013-04-29 ENCOUNTER — Other Ambulatory Visit: Payer: Self-pay | Admitting: *Deleted

## 2013-04-29 MED ORDER — HYDROCODONE-ACETAMINOPHEN 5-325 MG PO TABS: 1.0000 | ORAL_TABLET | Freq: Two times a day (BID) | ORAL | Status: DC | PRN

## 2013-05-20 ENCOUNTER — Other Ambulatory Visit: Payer: Self-pay | Admitting: Internal Medicine

## 2013-06-29 ENCOUNTER — Other Ambulatory Visit: Payer: Self-pay | Admitting: *Deleted

## 2013-07-01 ENCOUNTER — Other Ambulatory Visit: Payer: Self-pay | Admitting: *Deleted

## 2013-07-01 MED ORDER — OXYBUTYNIN CHLORIDE 5 MG PO TABS: ORAL_TABLET | ORAL | Status: DC

## 2013-07-01 NOTE — Telephone Encounter (Signed)
Flaf sent to front desk pool to sch appt according Dr Donnelly StagerButcher's directions. Stanton KidneyDebra Ditzler RN 07/01/13 3PM

## 2013-07-01 NOTE — Telephone Encounter (Signed)
Pls sch F/U appt with PCP within next 3 months

## 2013-07-20 ENCOUNTER — Other Ambulatory Visit: Payer: Self-pay | Admitting: Internal Medicine

## 2013-07-20 DIAGNOSIS — Z1231 Encounter for screening mammogram for malignant neoplasm of breast: Secondary | ICD-10-CM

## 2013-07-21 ENCOUNTER — Other Ambulatory Visit: Payer: Self-pay | Admitting: *Deleted

## 2013-07-21 MED ORDER — LOVASTATIN 20 MG PO TABS: 20.0000 mg | ORAL_TABLET | Freq: Every day | ORAL | Status: DC

## 2013-07-21 NOTE — Telephone Encounter (Signed)
Has Sep appt with PCP. Will refill one yr

## 2013-07-29 ENCOUNTER — Other Ambulatory Visit: Payer: Self-pay | Admitting: *Deleted

## 2013-07-29 MED ORDER — HYDROCODONE-ACETAMINOPHEN 5-325 MG PO TABS: 1.0000 | ORAL_TABLET | Freq: Two times a day (BID) | ORAL | Status: DC | PRN

## 2013-07-29 NOTE — Telephone Encounter (Signed)
Call when ready @ [phone]

## 2013-07-29 NOTE — Telephone Encounter (Signed)
Pt called and informed Rx is ready 

## 2013-08-03 ENCOUNTER — Other Ambulatory Visit: Payer: Self-pay | Admitting: *Deleted

## 2013-08-03 ENCOUNTER — Ambulatory Visit (HOSPITAL_COMMUNITY): Admission: RE | Admit: 2013-08-03 | Payer: Medicaid Other | Source: Ambulatory Visit

## 2013-08-03 MED ORDER — BUDESONIDE-FORMOTEROL FUMARATE 160-4.5 MCG/ACT IN AERO: 2.0000 | INHALATION_SPRAY | Freq: Two times a day (BID) | RESPIRATORY_TRACT | Status: DC

## 2013-08-04 ENCOUNTER — Ambulatory Visit (HOSPITAL_COMMUNITY)
Admission: RE | Admit: 2013-08-04 | Discharge: 2013-08-04 | Disposition: A | Discharge status: Home / Self Care | Payer: Medicaid Other | Source: Ambulatory Visit | Attending: Internal Medicine | Admitting: Internal Medicine

## 2013-08-04 ENCOUNTER — Other Ambulatory Visit: Payer: Self-pay | Admitting: *Deleted

## 2013-08-04 DIAGNOSIS — Z1231 Encounter for screening mammogram for malignant neoplasm of breast: Secondary | ICD-10-CM | POA: Diagnosis present

## 2013-08-04 MED ORDER — MONTELUKAST SODIUM 10 MG PO TABS: 10.0000 mg | ORAL_TABLET | Freq: Every day | ORAL | Status: DC

## 2013-08-14 ENCOUNTER — Other Ambulatory Visit: Payer: Self-pay | Admitting: Internal Medicine

## 2013-08-30 ENCOUNTER — Other Ambulatory Visit: Payer: Self-pay | Admitting: *Deleted

## 2013-08-30 MED ORDER — HYDROCODONE-ACETAMINOPHEN 5-325 MG PO TABS: 1.0000 | ORAL_TABLET | Freq: Two times a day (BID) | ORAL | Status: DC | PRN

## 2013-09-28 ENCOUNTER — Other Ambulatory Visit: Payer: Self-pay | Admitting: *Deleted

## 2013-09-28 MED ORDER — BUDESONIDE-FORMOTEROL FUMARATE 160-4.5 MCG/ACT IN AERO: 2.0000 | INHALATION_SPRAY | Freq: Two times a day (BID) | RESPIRATORY_TRACT | Status: DC

## 2013-09-30 ENCOUNTER — Encounter: Payer: Self-pay | Admitting: Internal Medicine

## 2013-09-30 ENCOUNTER — Ambulatory Visit (INDEPENDENT_AMBULATORY_CARE_PROVIDER_SITE_OTHER): Payer: Medicaid Other | Admitting: Internal Medicine

## 2013-09-30 VITALS — BP 143/84 | HR 82 | Temp 98.0°F | Ht 60.0 in | Wt 221.4 lb

## 2013-09-30 DIAGNOSIS — M545 Low back pain, unspecified: Secondary | ICD-10-CM | POA: Insufficient documentation

## 2013-09-30 DIAGNOSIS — Z23 Encounter for immunization: Secondary | ICD-10-CM

## 2013-09-30 DIAGNOSIS — G8929 Other chronic pain: Secondary | ICD-10-CM

## 2013-09-30 LAB — POCT URINE PREGNANCY: Preg Test, Ur: NEGATIVE

## 2013-09-30 MED ORDER — BUDESONIDE-FORMOTEROL FUMARATE 160-4.5 MCG/ACT IN AERO: 2.0000 | INHALATION_SPRAY | Freq: Two times a day (BID) | RESPIRATORY_TRACT | Status: DC

## 2013-09-30 MED ORDER — NICOTINE POLACRILEX 4 MG MT GUM: 4.0000 mg | CHEWING_GUM | OROMUCOSAL | Status: DC | PRN

## 2013-09-30 MED ORDER — HYDROCODONE-ACETAMINOPHEN 5-325 MG PO TABS: 1.0000 | ORAL_TABLET | Freq: Two times a day (BID) | ORAL | Status: DC | PRN

## 2013-09-30 NOTE — Addendum Note (Signed)
Addended by: Jupiter Boys E on: 09/30/2013 04:39 PM   Modules accepted: Level of Service  

## 2013-09-30 NOTE — Progress Notes (Signed)
Subjective:     Patient ID: Crystal Briggs, female   DOB: 02-Aug-1965, 48 y.o.   MRN: [id number]  HPI Ms. Purtee is a 48 yo woman with a PMH of asthma (uses Symbicort about 2 times per day and Proventil about 3 times per day), hypertension, stress incontinence, chronic low back pain and tobacco abuse who is here for evaluation of her worsening lower back pain, for smoking cessation and for the flu shot.  Chronic Lumbar Back Pain: described as dull, constant, and extending from spine laterally across entire lower back. It is worst on sitting, but still painful when she lies down or stands. By report, it is not made worse with movement. The pain has been present for several years, but has worsened over the past 3 days. There was no inciting event. She denies lower extremity numbness or tingling. She does experience urinary incontinence on occasion when she cough; she otherwise is not incontinent. Of note, she is s/p 2 NSVDs. Her Norco relieves the pain for 3-4 hours. She would like a higher dose.  Smoking Cessation:  Interested in quitting today. Has quit in the past for 2 weeks, but returned to smoking due to stressors with her neighbor at the time. Understands the advantages of quitting, especially with regard to her breathing. She is interested in trying nicorette gum.  Birth Control: patient is not currently sexually active, but is thinking about becoming sexually active in the future. She has had OCPs in the past; she has also had the depo shot (both years ago). Her periods occur once per month (LMP 08/21/13) and last 5 days. She has steady menstrual flow, not heavy.   Review of Systems General: has been "okay" Skin: no rashes or lesions, vaginal pruritis has subsided (11/2012) HEENT: no headaches, no changes in hearing or vision Cardiac: no chest pain or palpitations Respiratory: shortness of breath, worse with allergens, with activity; inhalers are helping GI: no changes in BMs Urinary:  stress incontinence since her children were born, coughing worst Msk: chronic lower back pain, see HPI Endocrine: no changes in weight or temperature tolerance Psychiatric: no changes     Objective:   Physical Exam Appearance: in NAD, sitting in examining room HEENT: AT/[place], PERRL, EOMi, no lymphadenopathy Heart: RRR, normal S1S2 Lungs: slightly decreased breath sounds b/l due to large upper body habitus, but CTAB, no wheezing Abdomen: BS+, soft, nontender Musculoskeletal: tenderness to palpation along lumbar spine and laterally from the spine on both sides; no CVA tenderness. Patient's pain elicited by bending to either side at her waist; also elicited to a lesser extent upon bending forward or extending backwards Extremities: no edema Neurologic: sensation intact and equal in LE, normal gait. Skin: no rashes or lesions     Assessment:     Crystal Briggs is a 48 yo woman with asthma, tobacco abuse, hypertension and chronic back pain here for evaluation of her worsening back pain, for help quitting smoking, to discuss starting OCPs and to attain a flu vaccine.     Plan:     Back Pain: - xray lumbar back today; to follow up with MRI if negative. Also consider muscle relaxant in future if XR/MRI negative and pain appears to be more muscular.  Smoking Cessation: - trial of nicorette gum; patient advised that if cost is too much or if she would like to try something else in the future (pills), we can change this plan  Request for Oral Contraceptives: - counseling on need to quit  smoking prior to start of OCPs; she will use condoms if she becomes sexually active in the meantime  Influenza vaccine given   Refills:  norco #60 and symbicort

## 2013-09-30 NOTE — Patient Instructions (Signed)
Get the xray of your back; I will follow up with you on the results. If they are negative, we can get an MRI. At that point, we will determine how to better treat you (physical therapy, muscle relaxants etc.)  Try the nicorette gum as prescribed. If it isn't working for you, we can try a pill instead. Once you quit smoking, please schedule an appointment and we can initiate birth control pills. In the meantime, it is too risky to start you on birth control pills (as a smoker who is over 31 years old). Suggest using condoms for birth control until you have quit smoking and a pill can be started.

## 2013-09-30 NOTE — Progress Notes (Signed)
INTERNAL MEDICINE TEACHING ATTENDING ADDENDUM - Earl Lagos, MD: I personally saw and evaluated Ms. Maravilla in this clinic visit in conjunction with the resident, Dr. Leatha Gilding. I have discussed patient's plan of care with medical resident during this visit. I have confirmed the physical exam findings and have read and agree with the clinic note including the plan with the following addition: - Pt here for f/u of back pain - Pt has never had a w/u for the back pain which is now worsening - Will get x ray lumbar spine. If wnl will consider MRI - c/w pain control. Consider flexeril if pain persistent- likely muscular component - Smoking cessation d/w patient. She wants to try OTC nicotine gum. If no success with this will consider Chantix or bupropion

## 2013-10-13 ENCOUNTER — Other Ambulatory Visit: Payer: Self-pay | Admitting: Internal Medicine

## 2013-10-13 DIAGNOSIS — R350 Frequency of micturition: Secondary | ICD-10-CM

## 2013-10-14 DIAGNOSIS — R350 Frequency of micturition: Secondary | ICD-10-CM | POA: Insufficient documentation

## 2013-10-28 ENCOUNTER — Other Ambulatory Visit: Payer: Self-pay | Admitting: *Deleted

## 2013-10-28 MED ORDER — HYDROCODONE-ACETAMINOPHEN 5-325 MG PO TABS: 1.0000 | ORAL_TABLET | Freq: Two times a day (BID) | ORAL | Status: DC | PRN

## 2013-10-28 NOTE — Telephone Encounter (Signed)
Last refill 9/17 Pt # [phone]

## 2013-10-28 NOTE — Telephone Encounter (Signed)
No pain contract on file.  I will ask Dr. Leatha GildingMallory to obtain a pain contract at the next visit if the plan is to continue the narco chronically.  As it appears she has been on the medication for awhile I am assuming this is the plan at this point and have refilled it for 1 month pending the pain contract.

## 2013-11-29 ENCOUNTER — Other Ambulatory Visit: Payer: Self-pay | Admitting: *Deleted

## 2013-11-30 ENCOUNTER — Other Ambulatory Visit: Payer: Self-pay | Admitting: Internal Medicine

## 2013-11-30 MED ORDER — HYDROCODONE-ACETAMINOPHEN 5-325 MG PO TABS: 1.0000 | ORAL_TABLET | Freq: Two times a day (BID) | ORAL | Status: DC | PRN

## 2013-11-30 MED ORDER — ALBUTEROL SULFATE HFA 108 (90 BASE) MCG/ACT IN AERS: 2.0000 | INHALATION_SPRAY | Freq: Four times a day (QID) | RESPIRATORY_TRACT | Status: DC | PRN

## 2013-12-01 NOTE — Telephone Encounter (Signed)
Pt aware.

## 2013-12-06 ENCOUNTER — Ambulatory Visit: Payer: Medicaid Other | Admitting: Internal Medicine

## 2013-12-13 ENCOUNTER — Encounter: Payer: Self-pay | Admitting: Internal Medicine

## 2013-12-13 ENCOUNTER — Other Ambulatory Visit (HOSPITAL_COMMUNITY)
Admission: RE | Admit: 2013-12-13 | Discharge: 2013-12-13 | Disposition: A | Discharge status: Home / Self Care | Payer: Medicaid Other | Source: Ambulatory Visit | Attending: Internal Medicine | Admitting: Internal Medicine

## 2013-12-13 ENCOUNTER — Ambulatory Visit (INDEPENDENT_AMBULATORY_CARE_PROVIDER_SITE_OTHER): Payer: Medicaid Other | Admitting: Internal Medicine

## 2013-12-13 VITALS — BP 137/73 | HR 91 | Temp 98.0°F | Resp 20 | Ht 61.5 in | Wt 223.2 lb

## 2013-12-13 DIAGNOSIS — Z113 Encounter for screening for infections with a predominantly sexual mode of transmission: Secondary | ICD-10-CM | POA: Diagnosis present

## 2013-12-13 DIAGNOSIS — Z01419 Encounter for gynecological examination (general) (routine) without abnormal findings: Secondary | ICD-10-CM | POA: Diagnosis present

## 2013-12-13 DIAGNOSIS — Z1151 Encounter for screening for human papillomavirus (HPV): Secondary | ICD-10-CM | POA: Diagnosis present

## 2013-12-13 DIAGNOSIS — N898 Other specified noninflammatory disorders of vagina: Secondary | ICD-10-CM | POA: Insufficient documentation

## 2013-12-13 DIAGNOSIS — N760 Acute vaginitis: Secondary | ICD-10-CM | POA: Insufficient documentation

## 2013-12-13 LAB — CERVICOVAGINAL ANCILLARY ONLY
WET PREP (BD AFFIRM): NEGATIVE
Wet Prep (BD Affirm): NEGATIVE
Wet Prep (BD Affirm): NEGATIVE

## 2013-12-13 NOTE — Assessment & Plan Note (Addendum)
Foul smelling vaginal discharge since around 11/16, which started before having unprotected intercourse. She denies any itching or pain. GU exam is normal. No significant discharge, no odor, but the pt is still having her menses, which is the 2nd time this month. I suspect that she is beginning to go through menopause. - Sent Wet Prep, GC/Chlamydia, and Pap Smear, although with her still having her menses, I am not sure how good the pap result will be. - Sent HIV - Pt to call the clinic if she begins to have increased discharge or foul smell - Advised to keep clean, wear cotton underwear, and avoid tight fitting pants.

## 2013-12-13 NOTE — Progress Notes (Signed)
Patient ID: Crystal Briggs, female   DOB: May 11, 1965, 48 y.o.   MRN: [id number]  Subjective:   Patient ID: Crystal SheenMartha M Briggs female   DOB: May 11, 1965 48 y.o.   MRN: [id number]  HPI: [name] is a 48 y.o. F w/ PMH asthma, HTN, tobacco abuse, and chronic low back pain presents for an acute visit for vaginal discharge.   Pt was last seen by her PCP 09/30/13 and at that time was considering becoming sexually active and was having monthly, regular periods. She requested OCPs but was told that she would need to quit smoking before starting the pills. She was advised to use condoms if she became sexually active.   Today she c/o pink/bloody vaginal discharge with a foul odor that started after her menses ended 11/16. She states that the discharge was normal but it had a foul odor, so she used a vinegar douche, and the smell went away. She had intercourse on 11/18 that was unprotected, and her period restarted 11/21 and the odor returned. She still has some mild bloody discharge now, but states that the odor is resolving. She denies any vaginal itching.   She denies any fevers, chills, nausea, vomiting, abdominal pain, or changes in urination.   Past Medical History  Diagnosis Date  . Hypertension   . Tobacco abuse 03/30/2012  . Stress incontinence, female 03/30/2012  . Seasonal allergies 03/30/2012  . Essential hypertension, benign 03/30/2012  . Asthma, chronic 03/30/2012   Current Outpatient Prescriptions  Medication Sig Dispense Refill  . albuterol (PROVENTIL HFA;VENTOLIN HFA) 108 (90 BASE) MCG/ACT inhaler Inhale 2 puffs into the lungs every 6 (six) hours as needed for wheezing or shortness of breath. 1 Inhaler 12  . amLODipine (NORVASC) 10 MG tablet TAKE 1 TABLET BY MOUTH EVERY DAY BEFORE BREAKFAST 30 tablet 6  . budesonide-formoterol (SYMBICORT) 160-4.5 MCG/ACT inhaler Inhale 2 puffs into the lungs 2 (two) times daily. 3 Inhaler 4  . cetirizine (ZYRTEC) 10 MG tablet Take 10 mg by mouth  daily.    . fluticasone (FLONASE) 50 MCG/ACT nasal spray Place 2 sprays into both nostrils daily. 16 g 6  . HYDROcodone-acetaminophen (NORCO/VICODIN) 5-325 MG per tablet Take 1 tablet by mouth 2 (two) times daily as needed. 60 tablet 0  . lovastatin (MEVACOR) 20 MG tablet Take 1 tablet (20 mg total) by mouth daily before breakfast. 90 tablet 3  . montelukast (SINGULAIR) 10 MG tablet Take 1 tablet (10 mg total) by mouth at bedtime. 90 tablet 4  . nicotine (NICODERM CQ - DOSED IN MG/24 HOURS) 14 mg/24hr patch Place 1 patch onto the skin daily. 30 patch 0  . nicotine polacrilex (NICORETTE) 4 MG gum Take 1 each (4 mg total) by mouth as needed for smoking cessation. 100 tablet 0  . oxybutynin (DITROPAN) 5 MG tablet Take 1 tablet (5 mg total) by mouth 4 (four) times daily. 120 tablet 11   No current facility-administered medications for this visit.   No family history on file. History   Social History  . Marital Status: Single    Spouse Name: N/A    Number of Children: N/A  . Years of Education: N/A   Social History Main Topics  . Smoking status: Current Every Day Smoker -- 0.50 packs/day for 20 years    Types: Cigarettes  . Smokeless tobacco: Never Used     Comment: SMOKE ABOUT12 CIGARETTES A DAY  . Alcohol Use: No  . Drug Use: No  . Sexual Activity: None  Other Topics Concern  . None   Social History Narrative   Review of Systems: A 12 point ROS was performed; pertinent positives and negatives were noted in the HPI   Objective:  Physical Exam: Filed Vitals:   12/13/13 0858  BP: 137/73  Pulse: 91  Temp: 98 F (36.7 C)  TempSrc: Oral  Resp: 20  Height: 5' 1.5" (1.562 m)  Weight: 223 lb 3.2 oz (101.243 kg)  SpO2: 100%   Constitutional: Vital signs reviewed.  Patient is a well-developed and well-nourished female in no acute distress and cooperative with exam. Alert and oriented x3.  Head: Normocephalic and atraumatic Eyes: PERRL, EOMI. No scleral icterus.  Neck: Normal  ROM Cardiovascular: RRR, no MRG Pulmonary/Chest: normal respiratory effort, CTAB, no wheezes, rales, or rhonchi Abdominal: Soft. Non-tender, non-distended, bowel sounds are normal GU: Labia and vaginal appear normal. Cervix and OS normal in appearance. Bloody discharge from Cervical OS, no purulence or white discharge appreciable. On bimanual exam, no palpable masses, no tenderness.  Musculoskeletal: No joint deformities. Moves all 4 extremities. Neurological: A&O x3, cranial nerve II-XII are grossly intact, no gross focal motor deficit Skin: Warm, dry and intact. No rash, cyanosis, or clubbing.  Psychiatric: Normal mood and affect. speech and behavior is normal.   Assessment & Plan:   Please refer to Problem List based Assessment and Plan

## 2013-12-13 NOTE — Progress Notes (Signed)
Internal Medicine Clinic Attending  Case discussed with Dr. Glenn soon after the resident saw the patient.  We reviewed the resident's history and exam and pertinent patient test results.  I agree with the assessment, diagnosis, and plan of care documented in the resident's note. 

## 2013-12-13 NOTE — Patient Instructions (Signed)
We should have the results back from your tests in the next few days. Please call the clinic on Wednesday if you have not heard back from us regarding the results.   Your exam looked normal today, so at this time I am going to hold off on prescribing any medications. If you develop abnormal discharge or if the odor returns or worsens, please call the clinic.   Continue to keep clean and wear cotton panties and avoid tight fitting pants, which will help reduce the risk of infection.  Always use protection when you have intercourse, unless you are in a monogamous relationship with your partner.    General Instructions:   Please bring your medicines with you each time you come to clinic.  Medicines may include prescription medications, over-the-counter medications, herbal remedies, eye drops, vitamins, or other pills.   Progress Toward Treatment Goals:  Treatment Goal 07/03/2012  Blood pressure at goal  Stop smoking smoking more    Self Care Goals & Plans:  Self Care Goal 12/13/2013  Manage my medications take my medicines as prescribed; bring my medications to every visit; refill my medications on time  Monitor my health bring my blood pressure log to each visit  Eat healthy foods eat smaller portions; drink diet soda or water instead of juice or soda; eat baked foods instead of fried foods  Be physically active take a walk every day  Stop smoking call [facility] (1-800-QUIT-NOW)  Meeting treatment goals -    No flowsheet data found.   Care Management & Community Referrals:  Referral 07/03/2012  Referrals made for care management support none needed

## 2013-12-14 LAB — CERVICOVAGINAL ANCILLARY ONLY
Chlamydia: NEGATIVE
Neisseria Gonorrhea: NEGATIVE

## 2013-12-14 LAB — CYTOLOGY - PAP

## 2013-12-28 ENCOUNTER — Other Ambulatory Visit: Payer: Self-pay | Admitting: *Deleted

## 2013-12-30 MED ORDER — HYDROCODONE-ACETAMINOPHEN 5-325 MG PO TABS: 1.0000 | ORAL_TABLET | Freq: Two times a day (BID) | ORAL | Status: DC | PRN

## 2014-01-25 ENCOUNTER — Other Ambulatory Visit: Payer: Self-pay | Admitting: *Deleted

## 2014-01-26 MED ORDER — HYDROCODONE-ACETAMINOPHEN 5-325 MG PO TABS: 1.0000 | ORAL_TABLET | Freq: Two times a day (BID) | ORAL | Status: DC | PRN

## 2014-01-26 NOTE — Telephone Encounter (Signed)
Pt aware.

## 2014-02-09 ENCOUNTER — Other Ambulatory Visit: Payer: Self-pay | Admitting: *Deleted

## 2014-02-11 MED ORDER — BUDESONIDE-FORMOTEROL FUMARATE 160-4.5 MCG/ACT IN AERO: 2.0000 | INHALATION_SPRAY | Freq: Two times a day (BID) | RESPIRATORY_TRACT | Status: DC

## 2014-03-01 ENCOUNTER — Other Ambulatory Visit: Payer: Self-pay | Admitting: *Deleted

## 2014-03-01 NOTE — Telephone Encounter (Signed)
Last refill 1/14 Pt # [phone]

## 2014-03-02 MED ORDER — HYDROCODONE-ACETAMINOPHEN 5-325 MG PO TABS: 1.0000 | ORAL_TABLET | Freq: Two times a day (BID) | ORAL | Status: DC | PRN

## 2014-03-03 NOTE — Telephone Encounter (Signed)
Pt informed Rx is ready 

## 2014-03-05 ENCOUNTER — Other Ambulatory Visit: Payer: Self-pay | Admitting: Oncology

## 2014-03-07 NOTE — Telephone Encounter (Signed)
This goes to Dr. Cyndie ChimeGranfortuna to refill at Internal Medicine.

## 2014-03-21 NOTE — Addendum Note (Signed)
Addended by: Charlsie MerlesGLENN, Imre Vecchione F on: 03/21/2014 02:21 PM   Modules accepted: Orders

## 2014-03-29 ENCOUNTER — Other Ambulatory Visit: Payer: Self-pay | Admitting: *Deleted

## 2014-03-31 MED ORDER — HYDROCODONE-ACETAMINOPHEN 5-325 MG PO TABS: 1.0000 | ORAL_TABLET | Freq: Two times a day (BID) | ORAL | Status: DC | PRN

## 2014-03-31 NOTE — Telephone Encounter (Signed)
Pt informed

## 2014-04-28 ENCOUNTER — Other Ambulatory Visit: Payer: Self-pay | Admitting: *Deleted

## 2014-04-29 MED ORDER — HYDROCODONE-ACETAMINOPHEN 5-325 MG PO TABS: 1.0000 | ORAL_TABLET | Freq: Two times a day (BID) | ORAL | Status: DC | PRN

## 2014-05-08 ENCOUNTER — Other Ambulatory Visit: Payer: Self-pay | Admitting: Internal Medicine

## 2014-05-16 ENCOUNTER — Encounter: Payer: Self-pay | Admitting: *Deleted

## 2014-05-24 ENCOUNTER — Other Ambulatory Visit: Payer: Self-pay | Admitting: Internal Medicine

## 2014-05-27 ENCOUNTER — Other Ambulatory Visit: Payer: Self-pay | Admitting: *Deleted

## 2014-05-27 MED ORDER — HYDROCODONE-ACETAMINOPHEN 5-325 MG PO TABS: 1.0000 | ORAL_TABLET | Freq: Two times a day (BID) | ORAL | Status: DC | PRN

## 2014-05-27 NOTE — Telephone Encounter (Signed)
Last refill 4/15 last office visit 11/15

## 2014-06-29 ENCOUNTER — Other Ambulatory Visit: Payer: Self-pay | Admitting: *Deleted

## 2014-06-29 NOTE — Telephone Encounter (Signed)
Last sign out 06/02/2014

## 2014-06-29 NOTE — Telephone Encounter (Signed)
There is no documentation why she needs chronic pain meds on Problem list. Pain on problem list but no notes under it. No PCP appt since Sept. Must make appt this month and then send request back for consideration.

## 2014-06-30 NOTE — Telephone Encounter (Signed)
Called pt, set up appt w/ dr Leatha Gilding for 6/23, pt understands no pain med til then

## 2014-07-01 ENCOUNTER — Other Ambulatory Visit: Payer: Self-pay | Admitting: Internal Medicine

## 2014-07-01 DIAGNOSIS — Z1231 Encounter for screening mammogram for malignant neoplasm of breast: Secondary | ICD-10-CM

## 2014-07-07 ENCOUNTER — Encounter: Payer: Self-pay | Admitting: Internal Medicine

## 2014-07-07 ENCOUNTER — Ambulatory Visit (INDEPENDENT_AMBULATORY_CARE_PROVIDER_SITE_OTHER): Payer: Medicaid Other | Admitting: Internal Medicine

## 2014-07-07 VITALS — BP 140/64 | HR 88 | Temp 98.3°F | Ht 61.5 in | Wt 219.6 lb

## 2014-07-07 DIAGNOSIS — I10 Essential (primary) hypertension: Secondary | ICD-10-CM

## 2014-07-07 DIAGNOSIS — G8929 Other chronic pain: Secondary | ICD-10-CM

## 2014-07-07 DIAGNOSIS — Z7951 Long term (current) use of inhaled steroids: Secondary | ICD-10-CM | POA: Diagnosis not present

## 2014-07-07 DIAGNOSIS — J45909 Unspecified asthma, uncomplicated: Secondary | ICD-10-CM

## 2014-07-07 DIAGNOSIS — Z720 Tobacco use: Secondary | ICD-10-CM

## 2014-07-07 DIAGNOSIS — M545 Low back pain, unspecified: Secondary | ICD-10-CM

## 2014-07-07 DIAGNOSIS — F17210 Nicotine dependence, cigarettes, uncomplicated: Secondary | ICD-10-CM

## 2014-07-07 DIAGNOSIS — Z0000 Encounter for general adult medical examination without abnormal findings: Secondary | ICD-10-CM | POA: Insufficient documentation

## 2014-07-07 MED ORDER — MONTELUKAST SODIUM 10 MG PO TABS: 10.0000 mg | ORAL_TABLET | Freq: Every day | ORAL | Status: DC

## 2014-07-07 MED ORDER — NICOTINE 7 MG/24HR TD PT24: 7.0000 mg | MEDICATED_PATCH | TRANSDERMAL | Status: DC

## 2014-07-07 MED ORDER — NICOTINE 14 MG/24HR TD PT24: 14.0000 mg | MEDICATED_PATCH | TRANSDERMAL | Status: DC

## 2014-07-07 MED ORDER — HYDROCODONE-ACETAMINOPHEN 5-325 MG PO TABS: 1.0000 | ORAL_TABLET | Freq: Two times a day (BID) | ORAL | Status: DC | PRN

## 2014-07-07 NOTE — Assessment & Plan Note (Addendum)
Patient has had no recent flares. Has albuterol and symbicort inhalers. Currently smoking 0.5 ppd, but is attempting to quit smoking.   - Nicoderm patches prescribed today (14 mg for 2 weeks, 7 mg for 2 weeks)

## 2014-07-07 NOTE — Patient Instructions (Signed)
Please complete the back xray so that we can better determine what is causing your pain.  If the xray does not show any abnormalities, we may need to order an MRI.  Keep working on your weight loss (through exercise and diet); work on decreasing the amount of salt in your diet (see DASH diet instructions below). If your blood pressure goes up, we may need to start another medication.  DASH Eating Plan DASH stands for "Dietary Approaches to Stop Hypertension." The DASH eating plan is a healthy eating plan that has been shown to reduce high blood pressure (hypertension). Additional health benefits may include reducing the risk of type 2 diabetes mellitus, heart disease, and stroke. The DASH eating plan may also help with weight loss. WHAT DO I NEED TO KNOW ABOUT THE DASH EATING PLAN? For the DASH eating plan, you will follow these general guidelines:  Choose foods with a percent daily value for sodium of less than 5% (as listed on the food label).  Use salt-free seasonings or herbs instead of table salt or sea salt.  Check with your health care provider or pharmacist before using salt substitutes.  Eat lower-sodium products, often labeled as "lower sodium" or "no salt added."  Eat fresh foods.  Eat more vegetables, fruits, and low-fat dairy products.  Choose whole grains. Look for the word "whole" as the first word in the ingredient list.  Choose fish and skinless chicken or [place] more often than red meat. Limit fish, poultry, and meat to 6 oz (170 g) each day.  Limit sweets, desserts, sugars, and sugary drinks.  Choose heart-healthy fats.  Limit cheese to 1 oz (28 g) per day.  Eat more home-cooked food and less restaurant, buffet, and fast food.  Limit fried foods.  Cook foods using methods other than frying.  Limit canned vegetables. If you do use them, rinse them well to decrease the sodium.  When eating at a restaurant, ask that your food be prepared with less salt, or no  salt if possible. WHAT FOODS CAN I EAT? Seek help from a dietitian for individual calorie needs. Grains Whole grain or whole wheat bread. Brown rice. Whole grain or whole wheat pasta. Quinoa, bulgur, and whole grain cereals. Low-sodium cereals. Corn or whole wheat flour tortillas. Whole grain cornbread. Whole grain crackers. Low-sodium crackers. Vegetables Fresh or frozen vegetables (raw, steamed, roasted, or grilled). Low-sodium or reduced-sodium tomato and vegetable juices. Low-sodium or reduced-sodium tomato sauce and paste. Low-sodium or reduced-sodium canned vegetables.  Fruits All fresh, canned (in natural juice), or frozen fruits. Meat and Other Protein Products Ground beef (85% or leaner), grass-fed beef, or beef trimmed of fat. Skinless chicken or [place]. Ground chicken or [place]. Pork trimmed of fat. All fish and seafood. Eggs. Dried beans, peas, or lentils. Unsalted nuts and seeds. Unsalted canned beans. Dairy Low-fat dairy products, such as skim or 1% milk, 2% or reduced-fat cheeses, low-fat ricotta or cottage cheese, or plain low-fat yogurt. Low-sodium or reduced-sodium cheeses. Fats and Oils Tub margarines without trans fats. Light or reduced-fat mayonnaise and salad dressings (reduced sodium). Avocado. Safflower, olive, or canola oils. Natural peanut or almond butter. Other Unsalted popcorn and pretzels. The items listed above may not be a complete list of recommended foods or beverages. Contact your dietitian for more options. WHAT FOODS ARE NOT RECOMMENDED? Grains White bread. White pasta. White rice. Refined cornbread. Bagels and croissants. Crackers that contain trans fat. Vegetables Creamed or fried vegetables. Vegetables in a cheese sauce. Regular canned  vegetables. Regular canned tomato sauce and paste. Regular tomato and vegetable juices. Fruits Dried fruits. Canned fruit in light or heavy syrup. Fruit juice. Meat and Other Protein Products Fatty cuts of meat. Ribs,  chicken wings, bacon, sausage, bologna, salami, chitterlings, fatback, hot dogs, bratwurst, and packaged luncheon meats. Salted nuts and seeds. Canned beans with salt. Dairy Whole or 2% milk, cream, half-and-half, and cream cheese. Whole-fat or sweetened yogurt. Full-fat cheeses or blue cheese. Nondairy creamers and whipped toppings. Processed cheese, cheese spreads, or cheese curds. Condiments Onion and garlic salt, seasoned salt, table salt, and sea salt. Canned and packaged gravies. Worcestershire sauce. Tartar sauce. Barbecue sauce. Teriyaki sauce. Soy sauce, including reduced sodium. Steak sauce. Fish sauce. Oyster sauce. Cocktail sauce. Horseradish. Ketchup and mustard. Meat flavorings and tenderizers. Bouillon cubes. Hot sauce. Tabasco sauce. Marinades. Taco seasonings. Relishes. Fats and Oils Butter, stick margarine, lard, shortening, ghee, and bacon fat. Coconut, palm kernel, or palm oils. Regular salad dressings. Other Pickles and olives. Salted popcorn and pretzels. The items listed above may not be a complete list of foods and beverages to avoid. Contact your dietitian for more information. WHERE CAN I FIND MORE INFORMATION? National Heart, Lung, and Blood Institute: [employer] Document Released: 12/20/2010 Document Revised: 05/17/2013 Document Reviewed: 11/04/2012 [facility] Patient Information 2015 [place], [place]. This information is not intended to replace advice given to you by your health care provider. Make sure you discuss any questions you have with your health care provider.   General Instructions:   Thank you for bringing your medicines today. This helps [place] keep you safe from mistakes.   Progress Toward Treatment Goals:  Treatment Goal 07/03/2012  Blood pressure at goal  Stop smoking smoking more    Self Care Goals & Plans:  Self Care Goal 12/13/2013  Manage my medications take my medicines as prescribed; bring my  medications to every visit; refill my medications on time  Monitor my health bring my blood pressure log to each visit  Eat healthy foods eat smaller portions; drink diet soda or water instead of juice or soda; eat baked foods instead of fried foods  Be physically active take a walk every day  Stop smoking call [facility] (1-800-QUIT-NOW)  Meeting treatment goals -    No flowsheet data found.   Care Management & Community Referrals:  Referral 07/03/2012  Referrals made for care management support none needed

## 2014-07-07 NOTE — Assessment & Plan Note (Signed)
Patient had a pap smear 11/2013, but results were not processed (patient was apparently having menses at the time of the pap). Discussed today, but patient not interested in pap. Patient recently went through menopause.   - Discuss need for pap at next appointment, as she is overdue

## 2014-07-07 NOTE — Progress Notes (Signed)
[facility] Subjective:   Patient ID: Crystal Briggs female   DOB: May 09, 1965 49 y.o.   MRN: [id number]  HPI: Crystal Briggs is a 49 y.o. female with a history of chronic lumbar back pain, stress incontinence (s/p 2 NSVDs), hypertension, asthma and tobacco abuse who presents for routine follow-up.   She continues to have her chronic low back pain. She describes the pain as dull and constant. It starts in her lumbar spine and extends laterally across her right back. Occasionally, she has pain in her legs, too. She denies lower extremity numbness, tingling or incontinence. Her hydrocodone/acetaminophen relieves her pain. She was advised to get an xray of her back at her last routine visit; she has not been able to coordinate this yet, but plans to go in the next few weeks, as her daughter finally has the time to take her.   She has continued smoking about half of a pack per day. She would like to decrease that amount. In the past, she has stopped smoking completely, so she knows she can do it. She would like to re-try the patches, which were somewhat successful last September (though she admits, she slowly stopped using them and returned to smoking).   Past Medical History  Diagnosis Date  . Hypertension   . Tobacco abuse 03/30/2012  . Stress incontinence, female 03/30/2012  . Seasonal allergies 03/30/2012  . Essential hypertension, benign 03/30/2012  . Asthma, chronic 03/30/2012   Current Outpatient Prescriptions  Medication Sig Dispense Refill  . albuterol (PROVENTIL HFA;VENTOLIN HFA) 108 (90 BASE) MCG/ACT inhaler Inhale 2 puffs into the lungs every 6 (six) hours as needed for wheezing or shortness of breath. 1 Inhaler 12  . amLODipine (NORVASC) 10 MG tablet TAKE 1 TABLET BY MOUTH DAILY BEFORE BREAKFAST 30 tablet 6  . cetirizine (ZYRTEC) 10 MG tablet Take 10 mg by mouth daily.    . fluticasone (FLONASE) 50 MCG/ACT nasal spray PLACE 2 SPRAYS INTO BOTH NOSTRILS  DAILY. 16 g 3  . HYDROcodone-acetaminophen (NORCO/VICODIN) 5-325 MG per tablet Take 1 tablet by mouth 2 (two) times daily as needed. 60 tablet 0  . lovastatin (MEVACOR) 20 MG tablet Take 1 tablet (20 mg total) by mouth daily before breakfast. 90 tablet 3  . montelukast (SINGULAIR) 10 MG tablet Take 1 tablet (10 mg total) by mouth at bedtime. 90 tablet 4  . nicotine (NICODERM CQ - DOSED IN MG/24 HOURS) 14 mg/24hr patch Place 1 patch (14 mg total) onto the skin daily. 14 patch 0  . nicotine (NICODERM CQ - DOSED IN MG/24 HR) 7 mg/24hr patch Place 1 patch (7 mg total) onto the skin daily. 14 patch 0  . oxybutynin (DITROPAN) 5 MG tablet Take 1 tablet (5 mg total) by mouth 4 (four) times daily. 120 tablet 11  . SYMBICORT 160-4.5 MCG/ACT inhaler INHALE 2 PUFFS INTO THE LUNGS 2 (TWO) TIMES DAILY. 30.6 Inhaler 3   No current facility-administered medications for this visit.   No family history on file. History   Social History  . Marital Status: Single    Spouse Name: N/A  . Number of Children: N/A  . Years of Education: N/A   Social History Main Topics  . Smoking status: Current Every Day Smoker -- 0.50 packs/day for 20 years    Types: Cigarettes  . Smokeless tobacco: Never Used     Comment: SMOKE ABOUT12 CIGARETTES A DAY  . Alcohol Use: No  . Drug Use: No  . Sexual  Activity: Not on file   Other Topics Concern  . None   Social History Narrative   Review of Systems: General: no recent illness Skin: no rashes or lesions HEENT: no headaches or blurred vision Cardiac: no chest pain or palpitations Respiratory: no shortness of breath or wheezing GI: no abdominal pain or changes in BMs Urinary: no dysuria or hematuria Msk: no changes Psychiatric: no history of anxiety or depression  Objective:  Physical Exam: Filed Vitals:   07/07/14 1346 07/07/14 1347  BP: 153/80 140/64  Pulse: 88 88  Temp: 98.3 F (36.8 C)   TempSrc: Oral   Height: 5' 1.5" (1.562 m)   Weight: 219 lb 9.6 oz  (99.61 kg)   SpO2: 100%    Patient lost 4 pounds since last appointment  HEENT: AT/[place], PERRL, EOMi Heart: RRR, normal S1S2 Lungs: CTAB, no wheezes, normal work of breathing Abdomen: BS+, soft, nontender Musculoskeletal: normal range of motion, bilateral paraspinal tenderness to palpation of lumbar spine Extremities: no edema b/l Neurologic: A&Ox3 Skin: no rashes or lesions  Assessment & Plan:  Case discussed with Dr. Criselda Peaches  Essential hypertension, benign BP Readings from Last 3 Encounters:  07/07/14 140/64  12/13/13 137/73  09/30/13 143/84    Lab Results  Component Value Date   NA 136 03/30/2012   K 4.5 03/30/2012   CREATININE 0.69 03/30/2012    Assessment: Blood pressure control: borderline   Progress toward BP goal: patient's initial BP today was 153/80, then it fell to 140/64. She is slightly above goal   Comments: Patient is adherent with her amlodipine 10 mg daily  Plan: Medications:  continue current medications Educational resources provided: DASH diet information given and discussed; patient has lost 4 pounds since last appointment, continued weight loss encouraged    - Plan to continue to monitor BP - Patient to continue with weight loss goals with adjustments for a lower-salt diet (DASH) - If BP continues to rise, consider adding HCTZ  Asthma, chronic Patient has had no recent flares. Has albuterol and symbicort inhalers. Currently smoking 0.5 ppd, but is attempting to quit smoking.   - Nicoderm patches prescribed today (14 mg for 2 weeks, 7 mg for 2 weeks)  Chronic lower back pain Patient continues to have chronic lumbar back pain. Her symptoms are vague (dull, constant pain, no exacerbating factors, occasionally accompanied by right shoulder and bilateral leg pain, no numbness/tingling/new loss of continence). XR lumbar spine ordered at last visit, but patient has not yet completed it.   - Patient to complete XR in the next few weeks (insists that she  would like her daughter to accompany her) - Consider MRI if XR is inconclusive - Continue hydrocodone/acetaminophen 5-325 (two daily PRN, #60 per month) in the meantime; patient completed pain contract today  Tobacco abuse Patient continues to smoke 0.5 ppd, but would like to try to quit again. Patches worked for her briefly in 09/2013, but she stopped using them before she was able to sustain quitting. Would like to try patches once again.  - 14 mg Nicoderm patches for 2 weeks to be followed by 7 mg Nicoderm patches for 2 weeks  Healthcare maintenance Patient had a pap smear 11/2013, but results were not processed (patient was apparently having menses at the time of the pap). Discussed today, but patient not interested in pap. Patient recently went through menopause.   - Discuss need for pap at next appointment, as she is overdue    Medications Ordered Meds ordered this  encounter  Medications  . montelukast (SINGULAIR) 10 MG tablet    Sig: Take 1 tablet (10 mg total) by mouth at bedtime.    Dispense:  90 tablet    Refill:  4  . HYDROcodone-acetaminophen (NORCO/VICODIN) 5-325 MG per tablet    Sig: Take 1 tablet by mouth 2 (two) times daily as needed.    Dispense:  60 tablet    Refill:  0  . nicotine (NICODERM CQ - DOSED IN MG/24 HOURS) 14 mg/24hr patch    Sig: Place 1 patch (14 mg total) onto the skin daily.    Dispense:  14 patch    Refill:  0  . nicotine (NICODERM CQ - DOSED IN MG/24 HR) 7 mg/24hr patch    Sig: Place 1 patch (7 mg total) onto the skin daily.    Dispense:  14 patch    Refill:  0   Other Orders No orders of the defined types were placed in this encounter.   Follow Up: Return in about 3 months (around 10/07/2014).

## 2014-07-07 NOTE — Assessment & Plan Note (Addendum)
BP Readings from Last 3 Encounters:  07/07/14 140/64  12/13/13 137/73  09/30/13 143/84    Lab Results  Component Value Date   NA 136 03/30/2012   K 4.5 03/30/2012   CREATININE 0.69 03/30/2012    Assessment: Blood pressure control: borderline   Progress toward BP goal: patient's initial BP today was 153/80, then it fell to 140/64. She is slightly above goal   Comments: Patient is adherent with her amlodipine 10 mg daily  Plan: Medications:  continue current medications Educational resources provided: DASH diet information given and discussed; patient has lost 4 pounds since last appointment, continued weight loss encouraged    - Plan to continue to monitor BP - Patient to continue with weight loss goals with adjustments for a lower-salt diet (DASH) - If BP continues to rise, consider adding HCTZ

## 2014-07-07 NOTE — Assessment & Plan Note (Signed)
Patient continues to smoke 0.5 ppd, but would like to try to quit again. Patches worked for her briefly in 09/2013, but she stopped using them before she was able to sustain quitting. Would like to try patches once again.  - 14 mg Nicoderm patches for 2 weeks to be followed by 7 mg Nicoderm patches for 2 weeks

## 2014-07-07 NOTE — Assessment & Plan Note (Signed)
Patient continues to have chronic lumbar back pain. Her symptoms are vague (dull, constant pain, no exacerbating factors, occasionally accompanied by right shoulder and bilateral leg pain, no numbness/tingling/new loss of continence). XR lumbar spine ordered at last visit, but patient has not yet completed it.   - Patient to complete XR in the next few weeks (insists that she would like her daughter to accompany her) - Consider MRI if XR is inconclusive - Continue hydrocodone/acetaminophen 5-325 (two daily PRN, #60 per month) in the meantime; patient completed pain contract today

## 2014-07-11 ENCOUNTER — Telehealth: Payer: Self-pay | Admitting: *Deleted

## 2014-07-11 NOTE — Progress Notes (Signed)
Internal Medicine Clinic Attending  Case discussed with Dr. Mallory at the time of the visit.  We reviewed the resident's history and exam and pertinent patient test results.  I agree with the assessment, diagnosis, and plan of care documented in the resident's note. 

## 2014-07-11 NOTE — Telephone Encounter (Signed)
Pt finally got montelukast on 07/09/14 - will run out too soon  again. Pt states she takes this med two times per day - directions are one tablet at bedtime. [facility]ses [facility] [address]. [facility] Kidney Neeya Prigmore RN 07/11/14 11:45AM

## 2014-07-15 MED ORDER — MONTELUKAST SODIUM 10 MG PO TABS: 10.0000 mg | ORAL_TABLET | Freq: Every day | ORAL | Status: DC

## 2014-07-15 NOTE — Telephone Encounter (Signed)
Pt aware do only do one tablet daily. To call if any problems.

## 2014-07-15 NOTE — Telephone Encounter (Signed)
Pls educate her that she is to take only once daily. I Rx'd only 30 which will last until her 8/1 appt with Dr Loney Lohathore.

## 2014-08-02 ENCOUNTER — Other Ambulatory Visit: Payer: Self-pay | Admitting: Internal Medicine

## 2014-08-02 MED ORDER — HYDROCODONE-ACETAMINOPHEN 5-325 MG PO TABS: 1.0000 | ORAL_TABLET | Freq: Two times a day (BID) | ORAL | Status: DC | PRN

## 2014-08-02 NOTE — Telephone Encounter (Signed)
Pt aware.

## 2014-08-02 NOTE — Telephone Encounter (Signed)
[facility] [address]  Pain med

## 2014-08-08 ENCOUNTER — Ambulatory Visit (HOSPITAL_COMMUNITY): Payer: Medicaid Other

## 2014-08-10 ENCOUNTER — Other Ambulatory Visit: Payer: Self-pay | Admitting: Internal Medicine

## 2014-08-15 ENCOUNTER — Ambulatory Visit: Payer: Medicaid Other | Admitting: Internal Medicine

## 2014-08-16 ENCOUNTER — Ambulatory Visit (HOSPITAL_COMMUNITY)
Admission: RE | Admit: 2014-08-16 | Discharge: 2014-08-16 | Disposition: A | Discharge status: Home / Self Care | Payer: Medicaid Other | Source: Ambulatory Visit | Attending: Internal Medicine | Admitting: Internal Medicine

## 2014-08-16 DIAGNOSIS — Z1231 Encounter for screening mammogram for malignant neoplasm of breast: Secondary | ICD-10-CM

## 2014-08-18 ENCOUNTER — Encounter: Payer: Self-pay | Admitting: Internal Medicine

## 2014-08-18 ENCOUNTER — Ambulatory Visit (INDEPENDENT_AMBULATORY_CARE_PROVIDER_SITE_OTHER): Payer: Medicaid Other | Admitting: Internal Medicine

## 2014-08-18 VITALS — BP 165/84 | HR 93 | Temp 98.0°F | Wt 221.8 lb

## 2014-08-18 DIAGNOSIS — I10 Essential (primary) hypertension: Secondary | ICD-10-CM

## 2014-08-18 DIAGNOSIS — M545 Low back pain, unspecified: Secondary | ICD-10-CM

## 2014-08-18 DIAGNOSIS — E785 Hyperlipidemia, unspecified: Secondary | ICD-10-CM

## 2014-08-18 DIAGNOSIS — J029 Acute pharyngitis, unspecified: Secondary | ICD-10-CM | POA: Diagnosis not present

## 2014-08-18 DIAGNOSIS — Z0000 Encounter for general adult medical examination without abnormal findings: Secondary | ICD-10-CM

## 2014-08-18 DIAGNOSIS — N393 Stress incontinence (female) (male): Secondary | ICD-10-CM

## 2014-08-18 DIAGNOSIS — J028 Acute pharyngitis due to other specified organisms: Secondary | ICD-10-CM

## 2014-08-18 DIAGNOSIS — Z139 Encounter for screening, unspecified: Secondary | ICD-10-CM

## 2014-08-18 DIAGNOSIS — Z720 Tobacco use: Secondary | ICD-10-CM

## 2014-08-18 DIAGNOSIS — R059 Cough, unspecified: Secondary | ICD-10-CM

## 2014-08-18 DIAGNOSIS — J45909 Unspecified asthma, uncomplicated: Secondary | ICD-10-CM | POA: Diagnosis not present

## 2014-08-18 DIAGNOSIS — B9789 Other viral agents as the cause of diseases classified elsewhere: Secondary | ICD-10-CM

## 2014-08-18 DIAGNOSIS — G8929 Other chronic pain: Secondary | ICD-10-CM

## 2014-08-18 DIAGNOSIS — R05 Cough: Secondary | ICD-10-CM

## 2014-08-18 DIAGNOSIS — F17210 Nicotine dependence, cigarettes, uncomplicated: Secondary | ICD-10-CM | POA: Diagnosis not present

## 2014-08-18 DIAGNOSIS — E669 Obesity, unspecified: Secondary | ICD-10-CM

## 2014-08-18 LAB — POCT GLYCOSYLATED HEMOGLOBIN (HGB A1C): Hemoglobin A1C: 5.4

## 2014-08-18 LAB — GLUCOSE, CAPILLARY: Glucose-Capillary: 79 mg/dL (ref 65–99)

## 2014-08-18 MED ORDER — CETIRIZINE HCL 10 MG PO TABS: 10.0000 mg | ORAL_TABLET | Freq: Every day | ORAL | Status: DC

## 2014-08-18 MED ORDER — GUAIFENESIN-CODEINE 100-10 MG/5ML PO SOLN: 5.0000 mL | Freq: Four times a day (QID) | ORAL | Status: DC | PRN

## 2014-08-18 NOTE — Progress Notes (Signed)
49 year old lady with cough, runny nose for the past week. No cervical lymphadenopathy, fever, or pharyngeal tonsillar  exudates. No asthma exacerbation seen on exam.   Viral pharyngitis/bronchitic cough - Guaifenesin-codeine cough syrup prescribed. Cetrizine for runny nose.  Essential hypertension - elevated blood pressures. Patient unwilling for blood work, last chemistries 2 years back. Will obtain blood work next week and select appropriate medication, HCTZ or lisinopril.   Asthma - smoking cessation counseling.  Current smoker - Stop nicotine patches if still smoking.  Back pain/pain all over - X ray lumbar spine before any further hydrocodone prescriptions.   HM colonoscopy - to be referred for screening  [facility] clinic in 1 week for cough follow up, xays and PAP smear.  Aletta Edouard MD MPH 08/18/2014 2:50 PM

## 2014-08-18 NOTE — Patient Instructions (Addendum)
Ms. Thai it was a pleasure meeting you today. I have prescribed Guaifenesen-Codeine syrup for your cough. In addition, you can take over the counter Zyrtec for allergies. Please follow up with me 1 week to discuss your lab results so that I can start you on a new blood pressure medication. In addition, I have scheduled your colonoscopy. Please remember to get your back x-ray done this week.   Kegel Exercises The goal of Kegel exercises is to isolate and exercise your pelvic floor muscles. These muscles act as a hammock that supports the rectum, vagina, small intestine, and uterus. As the muscles weaken, the hammock sags and these organs are displaced from their normal positions. Kegel exercises can strengthen your pelvic floor muscles and help you to improve bladder and bowel control, improve sexual response, and help reduce many problems and some discomfort during pregnancy. Kegel exercises can be done anywhere and at any time. HOW TO PERFORM KEGEL EXERCISES 1. Locate your pelvic floor muscles. To do this, squeeze (contract) the muscles that you use when you try to stop the flow of urine. You will feel a tightness in the vaginal area (women) and a tight lift in the rectal area (men and women). 2. When you begin, contract your pelvic muscles tight for 2-5 seconds, then relax them for 2-5 seconds. This is one set. Do 4-5 sets with a short pause in between. 3. Contract your pelvic muscles for 8-10 seconds, then relax them for 8-10 seconds. Do 4-5 sets. If you cannot contract your pelvic muscles for 8-10 seconds, try 5-7 seconds and work your way up to 8-10 seconds. Your goal is 4-5 sets of 10 contractions each day. Keep your stomach, buttocks, and legs relaxed during the exercises. Perform sets of both short and long contractions. Vary your positions. Perform these contractions 3-4 times per day. Perform sets while you are:   Lying in bed in the morning.  Standing at lunch.  Sitting in the late  afternoon.  Lying in bed at night. You should do 40-50 contractions per day. Do not perform more Kegel exercises per day than recommended. Overexercising can cause muscle fatigue. Continue these exercises for for at least 15-20 weeks or as directed by your caregiver. Document Released: 12/18/2011 Document Reviewed: 12/18/2011 [facility] Patient Information 2015 [place], [place]. This information is not intended to replace advice given to you by your health care provider. Make sure you discuss any questions you have with your health care provider.

## 2014-08-19 DIAGNOSIS — J028 Acute pharyngitis due to other specified organisms: Secondary | ICD-10-CM

## 2014-08-19 DIAGNOSIS — J029 Acute pharyngitis, unspecified: Secondary | ICD-10-CM | POA: Insufficient documentation

## 2014-08-19 DIAGNOSIS — B9789 Other viral agents as the cause of diseases classified elsewhere: Secondary | ICD-10-CM

## 2014-08-19 LAB — BASIC METABOLIC PANEL
BUN/Creatinine Ratio: 14 (ref 9–23)
BUN: 8 mg/dL (ref 6–24)
CHLORIDE: 98 mmol/L (ref 97–108)
CO2: 23 mmol/L (ref 18–29)
CREATININE: 0.59 mg/dL (ref 0.57–1.00)
Calcium: 9 mg/dL (ref 8.7–10.2)
GFR, EST AFRICAN AMERICAN: 124 mL/min/{1.73_m2} (ref >59)
GFR, EST NON AFRICAN AMERICAN: 107 mL/min/{1.73_m2} (ref >59)
Glucose: 75 mg/dL (ref 65–99)
Potassium: 4.4 mmol/L (ref 3.5–5.2)
Sodium: 138 mmol/L (ref 134–144)

## 2014-08-19 LAB — LIPID PANEL
Chol/HDL Ratio: 3.1 ratio units (ref 0.0–4.4)
Cholesterol, Total: 174 mg/dL (ref 100–199)
HDL: 56 mg/dL (ref >39)
LDL CALC: 101 mg/dL — AB (ref 0–99)
Triglycerides: 84 mg/dL (ref 0–149)
VLDL CHOLESTEROL CAL: 17 mg/dL (ref 5–40)

## 2014-08-19 LAB — CBC
HEMOGLOBIN: 12.5 g/dL (ref 11.1–15.9)
Hematocrit: 38.1 % (ref 34.0–46.6)
MCH: 25.7 pg — ABNORMAL LOW (ref 26.6–33.0)
MCHC: 32.8 g/dL (ref 31.5–35.7)
MCV: 78 fL — AB (ref 79–97)
Platelets: 342 10*3/uL (ref 150–379)
RBC: 4.87 x10E6/uL (ref 3.77–5.28)
RDW: 17.1 % — AB (ref 12.3–15.4)
WBC: 8.5 10*3/uL (ref 3.4–10.8)

## 2014-08-19 NOTE — Progress Notes (Signed)
Patient ID: Crystal Briggs, female   DOB: 19-Apr-1965, 49 y.o.   MRN: [id number]   Subjective:   Patient ID: Crystal Briggs female   DOB: [birth year] 49 y.o.   MRN: [id number]  HPI: [name] is a 49 y.o. F with a PMHx of asthma, HTN, HLD, stress incontinence, chronic lower back pain and tobacco use presenting to the clinic today with a 1.5 week history of cough, runny nose, and sneezing. Patient states the cough is accompanied by slight sputum production, yellowish in color. She has been taking Singulair and cough drops at home for her symptoms. Denies any F/C/CP/SOB/N/V/D. No wheezing. Patient is currently on Norco 5-325 1 tab BID for her chronic lower back pain and is requesting for the medication dose to be increased. States she has had right sided lower back pain for 5-6 years and sometimes it shoots down her right leg with a "tingling, stinging" sensation. Patient already has a history of stress incontinence. Denies any weakness or numbness in her lower extremities. Denies fecal incontinence.   Of note: we do not have any imaging of her back on file. During a previous visit in 09/2013 Dr. Leatha Gilding from our clinic ordered an x-ray but patient has not gone to radiology yet.   Past Medical History  Diagnosis Date  . Hypertension   . Tobacco abuse 03/30/2012  . Stress incontinence, female 03/30/2012  . Seasonal allergies 03/30/2012  . Essential hypertension, benign 03/30/2012  . Asthma, chronic 03/30/2012   Current Outpatient Prescriptions  Medication Sig Dispense Refill  . albuterol (PROVENTIL HFA;VENTOLIN HFA) 108 (90 BASE) MCG/ACT inhaler Inhale 2 puffs into the lungs every 6 (six) hours as needed for wheezing or shortness of breath. 1 Inhaler 12  . amLODipine (NORVASC) 10 MG tablet TAKE 1 TABLET BY MOUTH DAILY BEFORE BREAKFAST 30 tablet 6  . fluticasone (FLONASE) 50 MCG/ACT nasal spray PLACE 2 SPRAYS INTO BOTH NOSTRILS DAILY. 16 g 3  . HYDROcodone-acetaminophen (NORCO/VICODIN)  5-325 MG per tablet Take 1 tablet by mouth 2 (two) times daily as needed. 60 tablet 0  . lovastatin (MEVACOR) 20 MG tablet Take 1 tablet (20 mg total) by mouth daily before breakfast. 90 tablet 3  . montelukast (SINGULAIR) 10 MG tablet Take 1 tablet (10 mg total) by mouth at bedtime. 30 tablet 0  . oxybutynin (DITROPAN) 5 MG tablet Take 1 tablet (5 mg total) by mouth 4 (four) times daily. 120 tablet 11  . SYMBICORT 160-4.5 MCG/ACT inhaler INHALE 2 PUFFS INTO THE LUNGS 2 (TWO) TIMES DAILY. 30.6 Inhaler 3  . cetirizine (ZYRTEC) 10 MG tablet Take 1 tablet (10 mg total) by mouth daily. 30 tablet 0  . guaiFENesin-codeine 100-10 MG/5ML syrup Take 5 mLs by mouth every 6 (six) hours as needed for cough. 118 mL 0   No current facility-administered medications for this visit.   No family history on file. History   Social History  . Marital Status: Single    Spouse Name: N/A  . Number of Children: N/A  . Years of Education: N/A   Social History Main Topics  . Smoking status: Current Every Day Smoker -- 0.50 packs/day for 20 years    Types: Cigarettes  . Smokeless tobacco: Never Used     Comment: SMOKE ABOUT12 CIGARETTES A DAY  . Alcohol Use: No  . Drug Use: No  . Sexual Activity: Not on file   Other Topics Concern  . None   Social History Narrative   Review of Systems:  Review of Systems  Constitutional: Negative for fever and chills.  HENT: Negative for ear pain and sore throat.   Eyes: Negative for blurred vision, double vision and pain.  Respiratory: Positive for cough and sputum production. Negative for wheezing.        Runny nose Sneezing   Cardiovascular: Negative for chest pain and palpitations.  Gastrointestinal: Negative for heartburn, nausea, vomiting, abdominal pain, diarrhea and constipation.  Genitourinary: Positive for urgency. Negative for dysuria.  Musculoskeletal: Positive for back pain.  Skin: Negative for itching and rash.  Neurological: Positive for tingling.  Negative for sensory change, focal weakness, weakness and headaches.     Objective:  Physical Exam: Filed Vitals:   08/18/14 1325  BP: 165/84  Pulse: 93  Temp: 98 F (36.7 C)  TempSrc: Oral  Weight: 221 lb 12.8 oz (100.608 kg)  SpO2: 97%   Physical Exam  Constitutional: She is oriented to person, place, and time. She appears well-developed and well-nourished. No distress.  HENT:  Head: Normocephalic and atraumatic.  Mouth/Throat: No oropharyngeal exudate.  Eyes: EOM are normal. Pupils are equal, round, and reactive to light.  Neck: Normal range of motion. Neck supple. No tracheal deviation present.  Cardiovascular: Normal rate, regular rhythm, normal heart sounds and intact distal pulses.   Pulmonary/Chest: Effort normal and breath sounds normal. No respiratory distress. She has no wheezes. She has no rales.  Abdominal: Soft. Bowel sounds are normal. She exhibits no distension. There is no tenderness.  obese  Musculoskeletal: Normal range of motion. She exhibits tenderness. She exhibits no edema.  Tenderness on palpation of lower back and right iliac crest  Lymphadenopathy:    She has no cervical adenopathy.  Neurological: She is alert and oriented to person, place, and time.  Strength and sensation grossly intact in bilateral lower extremities.   Skin: Skin is warm and dry.    Assessment & Plan:

## 2014-08-19 NOTE — Assessment & Plan Note (Signed)
Not complaining of any wheezing or SOB. -Educated about smoking cessation

## 2014-08-19 NOTE — Assessment & Plan Note (Signed)
Patient was previously started on Nicotine patches for smoking cessation but states she is still smoking 7 cigarettes per day while using the patches. She was advised to stop using the patches because she cannot be smoking while using them. Patient agreed. -Reassess patient's desire to quit at next visit.

## 2014-08-19 NOTE — Assessment & Plan Note (Addendum)
Patient presenting with a 1.5 week Hx of cough, runny nose, and sneezing. Denies any SOB or wheezing. On physical examination, lungs are CTAB and no wheezing heard. She is afebrile and does not have cervical LAD or oropharyngeal exudates. Her symptoms are most likely 2/2 viral pharyngitis. Pharyngitis not likely bacterial in etiology because she is afebrile and there are no oropharyngeal exudates. No likely asthma exacerbation because she is not wheezing. Not likely acute bronchitis because she does not have dyspnea or wheezing. -Guifenesin-codeine syrup for cough  -Cetrizine for runny nose -Patient advised to return to the clinic if her symptoms do not improve in the next 4-5 days or she develops respiratory distress/ wheezing/ increased sputum production.  -F/u visit in 1 week

## 2014-08-19 NOTE — Assessment & Plan Note (Addendum)
BP done twice today and remains to be elevated with systolic in 160s. She is currently on Amlodipine 10 mg QD. Patient does not have any recent labs on file. Labs not done since 2014. -repeat BMP today and if renal function is normal consider starting patient on Lisinopril or HCTZ at next visit. She has a f/u appt. in 1 week.

## 2014-08-19 NOTE — Assessment & Plan Note (Addendum)
-  GI referral today for colonoscopy  -Labs: CBC, BMP, Lipid panel  -Pt returning to clinic in 1 week for Pap smear  -Recent Mammo normal. Repeat in 1 year (08/2015).

## 2014-08-19 NOTE — Assessment & Plan Note (Signed)
Patient is currently on Norco 5-325 1 tab BID and is requesting for the medication dose to be increased. States she has had right sided lower back pain for 5-6 years and sometimes it shoots down her right leg with a "tingling, stinging" sensation. She already has a history of stress incontinence. Denies any weakness or numbness in her lower extremities. Denies fecal incontinence.  -Advised patient to get her x-ray done before any further hydrocodone prescriptions. She has a f/u visit in 1 week and advised he to get the x-ray done this week.

## 2014-08-19 NOTE — Assessment & Plan Note (Signed)
She is currently on Ditropan 5 mg QD. States she does not have problems during the day but has to wear a pad at night because urine leaks out. She was wondering if her medication dose needs to be increased. The stress incontinence at night could be likely due to patient coughing, which increases intra-abdominal pressure and thus increases bladder pressure.  -Advised pt to take her cough medicine this week and monitor to see if her incontinence gets better. -F/u appt in 1 week and will reassess to see if dose of Ditropan needs to be adjusted

## 2014-08-20 ENCOUNTER — Other Ambulatory Visit: Payer: Self-pay | Admitting: Internal Medicine

## 2014-08-25 ENCOUNTER — Ambulatory Visit (HOSPITAL_COMMUNITY)
Admission: RE | Admit: 2014-08-25 | Discharge: 2014-08-25 | Disposition: A | Discharge status: Home / Self Care | Payer: Medicaid Other | Source: Ambulatory Visit | Attending: Internal Medicine | Admitting: Internal Medicine

## 2014-08-25 ENCOUNTER — Ambulatory Visit (INDEPENDENT_AMBULATORY_CARE_PROVIDER_SITE_OTHER): Payer: Medicaid Other | Admitting: Internal Medicine

## 2014-08-25 VITALS — BP 148/76 | HR 90 | Temp 97.8°F | Ht 61.0 in | Wt 219.9 lb

## 2014-08-25 DIAGNOSIS — M545 Low back pain: Secondary | ICD-10-CM | POA: Diagnosis present

## 2014-08-25 DIAGNOSIS — G8929 Other chronic pain: Secondary | ICD-10-CM

## 2014-08-25 DIAGNOSIS — N393 Stress incontinence (female) (male): Secondary | ICD-10-CM

## 2014-08-25 DIAGNOSIS — I10 Essential (primary) hypertension: Secondary | ICD-10-CM

## 2014-08-25 DIAGNOSIS — M5136 Other intervertebral disc degeneration, lumbar region: Secondary | ICD-10-CM | POA: Insufficient documentation

## 2014-08-25 DIAGNOSIS — Z0000 Encounter for general adult medical examination without abnormal findings: Secondary | ICD-10-CM

## 2014-08-25 DIAGNOSIS — M47896 Other spondylosis, lumbar region: Secondary | ICD-10-CM | POA: Diagnosis not present

## 2014-08-25 DIAGNOSIS — J029 Acute pharyngitis, unspecified: Secondary | ICD-10-CM | POA: Diagnosis not present

## 2014-08-25 DIAGNOSIS — M549 Dorsalgia, unspecified: Secondary | ICD-10-CM

## 2014-08-25 DIAGNOSIS — F17210 Nicotine dependence, cigarettes, uncomplicated: Secondary | ICD-10-CM | POA: Diagnosis not present

## 2014-08-25 DIAGNOSIS — J028 Acute pharyngitis due to other specified organisms: Secondary | ICD-10-CM

## 2014-08-25 DIAGNOSIS — Z720 Tobacco use: Secondary | ICD-10-CM

## 2014-08-25 DIAGNOSIS — B9789 Other viral agents as the cause of diseases classified elsewhere: Secondary | ICD-10-CM

## 2014-08-25 MED ORDER — MONTELUKAST SODIUM 10 MG PO TABS: 10.0000 mg | ORAL_TABLET | Freq: Every day | ORAL | Status: DC

## 2014-08-25 MED ORDER — ACETAMINOPHEN-CODEINE #3 300-30 MG PO TABS: 1.0000 | ORAL_TABLET | Freq: Every evening | ORAL | Status: DC | PRN

## 2014-08-25 MED ORDER — LOVASTATIN 20 MG PO TABS: 20.0000 mg | ORAL_TABLET | Freq: Every day | ORAL | Status: DC

## 2014-08-25 NOTE — Patient Instructions (Addendum)
Crystal Briggs it was nice seeing you today. Please take Acetaminophen-Codeine 300-30 mg 1 tablet by mouth at bedtime as needed for cough. In addition, I want to remind you to get your back x-ray done at your earliest convenience. I will see you in 3-4 weeks.

## 2014-08-26 ENCOUNTER — Encounter: Payer: Self-pay | Admitting: Internal Medicine

## 2014-08-26 NOTE — Assessment & Plan Note (Addendum)
Patient could not pick up her prescription for Guaifenesin-Codeine syrup for financial reasons. Runny nose and sneezing have resolved but cough continues. Denies any SOB or wheezing. Patient is afebrile, lungs CTAB, no wheezing heard. No cervical LAD, no oropharyngeal exudates. She is most likely having post-infectious reactive cough.  -Acetaminophen-Codeine 300-30 mg 1 tablet by mouth at bedtime as needed for cough -F/u in 3-4 weeks

## 2014-08-26 NOTE — Assessment & Plan Note (Signed)
Patient has not gotten her x-ray done yet. We called radiology and sent her up for an x-ray today. -F/u results

## 2014-08-26 NOTE — Assessment & Plan Note (Signed)
Patient states she has stopped using nicotine patches along with cigarettes as previously advised. She is still smoking cigarettes and is not ready to quit at this time. -counsel about smoking cessation at next visit

## 2014-08-26 NOTE — Assessment & Plan Note (Signed)
-  Colonoscopy pending, was ordered at last visit -Patient is currently menstruating. Pap smear at next visit.  -Mammo due 08/2015

## 2014-08-26 NOTE — Assessment & Plan Note (Addendum)
Patient continues to have stress incontinence at night. Most likely due to coughing and resulting increased intraabdominal pressure. She did not take any medicine for her cough since her last visit.  -Prescribed Tylenol #3 for cough today -Kegel exercises -Reasess at next visit. If the cough resolves and she continues to have stress incontinence at night, consider adjusting dose of Ditropan.

## 2014-08-26 NOTE — Progress Notes (Signed)
Patient ID: Crystal Briggs, female   DOB: Oct 15, 1965, 49 y.o.   MRN: [id number]   Subjective:   Patient ID: Crystal Briggs female   DOB: [birth year] 49 y.o.   MRN: [id number]  HPI: [name] is a 49 y.o. F with a PMHx of asthma, HTN, HLD, stress incontinence, chronic lower back pain, and tobacco use presenting to the clinic today for a follow up. She was here on 08/18/14 for cough, runny nose, sneezing and was diagnosed with acute viral pharyngitis. She was prescribed Guaifenesin-codeine syrup and cetrizine. Today patient is not complaining of runny nose or sneezing anymore but states she is still coughing. Patient states she never picked her prescription for the cough syrup because it was 17 dollars and she could not afford to pay for it. As per patient, the cough is the same as last time and has not gotten any better or worse. Denies any fevers, chills, SOB, or wheezing.   Past Medical History  Diagnosis Date  . Hypertension   . Tobacco abuse 03/30/2012  . Stress incontinence, female 03/30/2012  . Seasonal allergies 03/30/2012  . Essential hypertension, benign 03/30/2012  . Asthma, chronic 03/30/2012   Current Outpatient Prescriptions  Medication Sig Dispense Refill  . acetaminophen-codeine (TYLENOL #3) 300-30 MG per tablet Take 1 tablet by mouth at bedtime as needed (for cough). 14 tablet 0  . albuterol (PROVENTIL HFA;VENTOLIN HFA) 108 (90 BASE) MCG/ACT inhaler Inhale 2 puffs into the lungs every 6 (six) hours as needed for wheezing or shortness of breath. 1 Inhaler 12  . amLODipine (NORVASC) 10 MG tablet TAKE 1 TABLET BY MOUTH DAILY BEFORE BREAKFAST 30 tablet 6  . cetirizine (ZYRTEC) 10 MG tablet Take 1 tablet (10 mg total) by mouth daily. 30 tablet 0  . fluticasone (FLONASE) 50 MCG/ACT nasal spray PLACE 2 SPRAYS INTO BOTH NOSTRILS DAILY. 16 g 3  . HYDROcodone-acetaminophen (NORCO/VICODIN) 5-325 MG per tablet Take 1 tablet by mouth 2 (two) times daily as needed. 60 tablet 0  .  lovastatin (MEVACOR) 20 MG tablet Take 1 tablet (20 mg total) by mouth daily before breakfast. 90 tablet 3  . montelukast (SINGULAIR) 10 MG tablet Take 1 tablet (10 mg total) by mouth at bedtime. 30 tablet 2  . oxybutynin (DITROPAN) 5 MG tablet Take 1 tablet (5 mg total) by mouth 4 (four) times daily. 120 tablet 11  . SYMBICORT 160-4.5 MCG/ACT inhaler INHALE 2 PUFFS INTO THE LUNGS 2 (TWO) TIMES DAILY. 30.6 Inhaler 3   No current facility-administered medications for this visit.   No family history on file. Social History   Social History  . Marital Status: Single    Spouse Name: N/A  . Number of Children: N/A  . Years of Education: N/A   Social History Main Topics  . Smoking status: Current Every Day Smoker -- 0.50 packs/day for 20 years    Types: Cigarettes  . Smokeless tobacco: Never Used     Comment: SMOKE ABOUT12 CIGARETTES A DAY  . Alcohol Use: No  . Drug Use: No  . Sexual Activity: Not on file   Other Topics Concern  . Not on file   Social History Narrative   Review of Systems: Review of Systems  Constitutional: Negative for fever, chills and malaise/fatigue.  HENT: Negative for ear pain.   Eyes: Negative for blurred vision and pain.  Respiratory: Positive for cough. Negative for sputum production, shortness of breath and wheezing.   Cardiovascular: Negative for chest pain and leg  swelling.  Gastrointestinal: Negative for nausea, vomiting, abdominal pain, diarrhea and constipation.  Genitourinary: Negative.   Skin: Negative for itching and rash.  Neurological: Negative for dizziness, sensory change, focal weakness and headaches.     Objective:  Physical Exam: Filed Vitals:   08/25/14 1019  BP: 148/76  Pulse: 90  Temp: 97.8 F (36.6 C)  TempSrc: Oral  Height: 5\' 1"  (1.549 m)  Weight: 219 lb 14.4 oz (99.746 kg)  SpO2: 99%   Physical Exam  Constitutional: She is oriented to person, place, and time. She appears well-developed and well-nourished. No distress.   HENT:  Head: Normocephalic and atraumatic.  Eyes: EOM are normal. Pupils are equal, round, and reactive to light.  Neck: Neck supple. No tracheal deviation present.  No oropharyngeal exudate   Cardiovascular: Normal rate, regular rhythm and intact distal pulses.   Pulmonary/Chest: Effort normal and breath sounds normal. No respiratory distress. She has no wheezes. She has no rales.  Abdominal: Soft. Bowel sounds are normal. She exhibits no distension. There is no tenderness.  Lymphadenopathy:    She has no cervical adenopathy.  Neurological: She is alert and oriented to person, place, and time.  Skin: Skin is warm and dry.   Assessment & Plan:

## 2014-08-26 NOTE — Assessment & Plan Note (Signed)
  BP Readings from Last 3 Encounters:  08/25/14 148/76  08/18/14 165/84  07/07/14 140/64    Lab Results  Component Value Date   NA 138 08/18/2014   K 4.4 08/18/2014   CREATININE 0.59 08/18/2014    Assessment: Blood pressure control:  borderline Progress toward BP goal:   slightly above goal (<140/90) Comments: patient taking Amlodipine 10 mg qd  Plan: Medications:  Continue current management  Educational resources provided:  counseled about DASH diet and exercise  Self management tools provided:  instructions for home blood pressure monitoring  Other plans: if blood pressure continues to be elevated at next visit, consider starting patient on Lisinopril or HCTZ

## 2014-08-29 NOTE — Progress Notes (Signed)
Internal Medicine Clinic Attending  I saw and evaluated the patient.  I personally confirmed the key portions of the history and exam documented by Dr. Rathore and I reviewed pertinent patient test results.  The assessment, diagnosis, and plan were formulated together and I agree with the documentation in the resident's note.  

## 2014-09-01 NOTE — Telephone Encounter (Signed)
Just filled on 08/25/14 by Dr. Loney Loh.

## 2014-09-01 NOTE — Telephone Encounter (Signed)
Checked w/ pharm, they got it, computer did not associate

## 2014-09-02 ENCOUNTER — Telehealth: Payer: Self-pay | Admitting: Internal Medicine

## 2014-09-02 ENCOUNTER — Other Ambulatory Visit: Payer: Self-pay | Admitting: Internal Medicine

## 2014-09-02 DIAGNOSIS — M549 Dorsalgia, unspecified: Principal | ICD-10-CM

## 2014-09-02 DIAGNOSIS — G8929 Other chronic pain: Secondary | ICD-10-CM

## 2014-09-02 MED ORDER — HYDROCODONE-ACETAMINOPHEN 5-325 MG PO TABS: 1.0000 | ORAL_TABLET | Freq: Two times a day (BID) | ORAL | Status: DC | PRN

## 2014-09-02 NOTE — Telephone Encounter (Signed)
Patient would like a call back about her Xray results.

## 2014-09-02 NOTE — Telephone Encounter (Signed)
Spoke to the patient on the phone and explained that her back x-ray did not show any acute findings. I told her that I will refill her Norco prescription for this month but do want her to follow up with sports medicine. Referral put in today.

## 2014-09-02 NOTE — Telephone Encounter (Signed)
Patient requesting hydrocodone to be filled.  Patient uses [facility] on [address].

## 2014-09-02 NOTE — Telephone Encounter (Signed)
I called her today to discuss the results.

## 2014-09-02 NOTE — Telephone Encounter (Signed)
Pt informed Rx is ready 

## 2014-09-02 NOTE — Telephone Encounter (Signed)
Last filled at [facility] 7/21 #60 Tylenol #3 #14 filled 8/11 Last visit 8/11 Next visit 9/6 Last UDS 11/2012

## 2014-09-13 ENCOUNTER — Other Ambulatory Visit: Payer: Self-pay | Admitting: *Deleted

## 2014-09-13 DIAGNOSIS — Z889 Allergy status to unspecified drugs, medicaments and biological substances status: Secondary | ICD-10-CM

## 2014-09-13 DIAGNOSIS — J029 Acute pharyngitis, unspecified: Secondary | ICD-10-CM

## 2014-09-13 DIAGNOSIS — B9789 Other viral agents as the cause of diseases classified elsewhere: Principal | ICD-10-CM

## 2014-09-13 DIAGNOSIS — J028 Acute pharyngitis due to other specified organisms: Principal | ICD-10-CM

## 2014-09-13 MED ORDER — CETIRIZINE HCL 10 MG PO TABS: 10.0000 mg | ORAL_TABLET | Freq: Every day | ORAL | Status: DC

## 2014-09-18 ENCOUNTER — Other Ambulatory Visit: Payer: Self-pay | Admitting: Internal Medicine

## 2014-09-20 ENCOUNTER — Ambulatory Visit (INDEPENDENT_AMBULATORY_CARE_PROVIDER_SITE_OTHER): Payer: Medicaid Other | Admitting: Internal Medicine

## 2014-09-20 ENCOUNTER — Encounter: Payer: Self-pay | Admitting: Internal Medicine

## 2014-09-20 VITALS — BP 147/73 | HR 82 | Temp 97.9°F | Ht 61.0 in | Wt 222.4 lb

## 2014-09-20 DIAGNOSIS — I10 Essential (primary) hypertension: Secondary | ICD-10-CM

## 2014-09-21 ENCOUNTER — Other Ambulatory Visit: Payer: Self-pay | Admitting: Internal Medicine

## 2014-09-21 NOTE — Telephone Encounter (Signed)
Patient left before I could see her. I am not sure how long she had to wait. When I finished with my previous patient and went to see this patient, I was informed by nursing staff that this patient had another appointment somewhere else and had to leave. Please ask her to return to the clinic at her earliest convenience to discuss her cough and back pain. Thanks.

## 2014-09-21 NOTE — Telephone Encounter (Signed)
Please call pt back regarding tylenol 3. Please call pt back.

## 2014-09-21 NOTE — Telephone Encounter (Signed)
Pt was offered an appt and she refused. She states she sees no point in coming to clinic and no one doing anything. She wants to go to the appt w/ the specialist

## 2014-09-21 NOTE — Telephone Encounter (Signed)
Pt states she did not want to talk about her ty#3 she just wanted someone to call her back about her cough and back problem. 1) she came to her appt yesterday but states she sat in the room for 45 minutes while she heard everyone in the hall outside "cutting up and having a good time", "my ride had to go so i got up and left, i didn t have a choice but they could have seen me but they were having too much fun" 2) the ty#3 syrup did not help her cough, "in fact it made it worser", "maybe i need an antibiotic" 3) "my back is still hurting, just cause they didn't see anything doesn't mean i aint hurting, she said they were going to send me to a special doctor, i havent heard anything"

## 2014-09-21 NOTE — Telephone Encounter (Signed)
Pt called requesting amlodipine to be filled. °

## 2014-09-22 NOTE — Telephone Encounter (Signed)
Patient requesting Singulair, Norvasc Refill.  Patient also asked about seeing a Specialist.  Informed patient that she was Ref to [facility] and Dr. Margaretha Sheffield is also Refer/Patient to Pain Management instead.  Patient understood and will now wait for the Pain Management appt which may take a couple of weeks.

## 2014-09-22 NOTE — Telephone Encounter (Signed)
singulair was ordered in august and has 2 refills

## 2014-09-23 MED ORDER — AMLODIPINE BESYLATE 10 MG PO TABS: 10.0000 mg | ORAL_TABLET | Freq: Every day | ORAL | Status: DC

## 2014-09-23 NOTE — Telephone Encounter (Signed)
Called pharm

## 2014-09-26 ENCOUNTER — Other Ambulatory Visit: Payer: Self-pay | Admitting: Internal Medicine

## 2014-09-28 ENCOUNTER — Ambulatory Visit: Payer: Medicaid Other | Admitting: Internal Medicine

## 2014-09-29 NOTE — Progress Notes (Signed)
Patient left before being seen.

## 2014-09-29 NOTE — Telephone Encounter (Signed)
Singulair filled 08/25/14 with 2 refills.

## 2014-10-03 ENCOUNTER — Other Ambulatory Visit: Payer: Self-pay | Admitting: Internal Medicine

## 2014-10-03 ENCOUNTER — Other Ambulatory Visit: Payer: Self-pay | Admitting: *Deleted

## 2014-10-03 MED ORDER — HYDROCODONE-ACETAMINOPHEN 5-325 MG PO TABS: 1.0000 | ORAL_TABLET | Freq: Two times a day (BID) | ORAL | Status: DC | PRN

## 2014-10-03 NOTE — Telephone Encounter (Signed)
Last filled 8/19 Last appt 9/6, no scheduled appts at present, the 9/6 appt visit was not completed due to pt leaving abruptly, called pt scheduled an oct appt for pt, it is at the end of your morning, she is quiet mad about coming in but states she needs a complete physical, her "female exam" and a flu shot maybe some bloodwork, we could get a uds that day also Cannot find a UDS

## 2014-10-03 NOTE — Telephone Encounter (Signed)
Request sent to doctor

## 2014-10-03 NOTE — Telephone Encounter (Signed)
Patient requesting a Hydrocodone Refill. °

## 2014-10-04 NOTE — Telephone Encounter (Signed)
informed

## 2014-10-06 ENCOUNTER — Other Ambulatory Visit: Payer: Self-pay | Admitting: Internal Medicine

## 2014-10-06 ENCOUNTER — Other Ambulatory Visit: Payer: Self-pay | Admitting: *Deleted

## 2014-10-06 MED ORDER — HYDROCODONE-ACETAMINOPHEN 5-325 MG PO TABS: 1.0000 | ORAL_TABLET | Freq: Two times a day (BID) | ORAL | Status: DC | PRN

## 2014-10-06 NOTE — Telephone Encounter (Signed)
Rx given to pt. 

## 2014-10-20 ENCOUNTER — Other Ambulatory Visit: Payer: Self-pay | Admitting: Internal Medicine

## 2014-10-21 ENCOUNTER — Ambulatory Visit (INDEPENDENT_AMBULATORY_CARE_PROVIDER_SITE_OTHER): Payer: Medicaid Other | Admitting: Internal Medicine

## 2014-10-21 ENCOUNTER — Encounter: Payer: Self-pay | Admitting: Internal Medicine

## 2014-10-21 VITALS — BP 154/72 | HR 81 | Temp 98.5°F | Ht 61.0 in | Wt 220.9 lb

## 2014-10-21 DIAGNOSIS — B9789 Other viral agents as the cause of diseases classified elsewhere: Secondary | ICD-10-CM

## 2014-10-21 DIAGNOSIS — F17210 Nicotine dependence, cigarettes, uncomplicated: Secondary | ICD-10-CM | POA: Diagnosis not present

## 2014-10-21 DIAGNOSIS — Z7951 Long term (current) use of inhaled steroids: Secondary | ICD-10-CM

## 2014-10-21 DIAGNOSIS — N393 Stress incontinence (female) (male): Secondary | ICD-10-CM | POA: Diagnosis not present

## 2014-10-21 DIAGNOSIS — Z889 Allergy status to unspecified drugs, medicaments and biological substances status: Secondary | ICD-10-CM

## 2014-10-21 DIAGNOSIS — J028 Acute pharyngitis due to other specified organisms: Secondary | ICD-10-CM

## 2014-10-21 DIAGNOSIS — G8929 Other chronic pain: Secondary | ICD-10-CM

## 2014-10-21 DIAGNOSIS — I10 Essential (primary) hypertension: Secondary | ICD-10-CM | POA: Diagnosis not present

## 2014-10-21 DIAGNOSIS — J45909 Unspecified asthma, uncomplicated: Secondary | ICD-10-CM | POA: Diagnosis not present

## 2014-10-21 DIAGNOSIS — M545 Low back pain: Secondary | ICD-10-CM

## 2014-10-21 DIAGNOSIS — J029 Acute pharyngitis, unspecified: Secondary | ICD-10-CM | POA: Diagnosis not present

## 2014-10-21 DIAGNOSIS — J4540 Moderate persistent asthma, uncomplicated: Secondary | ICD-10-CM

## 2014-10-21 MED ORDER — CETIRIZINE HCL 10 MG PO TABS: 10.0000 mg | ORAL_TABLET | Freq: Every day | ORAL | Status: DC

## 2014-10-21 MED ORDER — BUDESONIDE-FORMOTEROL FUMARATE 160-4.5 MCG/ACT IN AERO: INHALATION_SPRAY | RESPIRATORY_TRACT | Status: DC

## 2014-10-21 MED ORDER — DICLOFENAC SODIUM 75 MG PO TBEC: 75.0000 mg | DELAYED_RELEASE_TABLET | Freq: Two times a day (BID) | ORAL | Status: DC

## 2014-10-21 MED ORDER — HYDROCHLOROTHIAZIDE 12.5 MG PO CAPS: 12.5000 mg | ORAL_CAPSULE | Freq: Every day | ORAL | Status: DC

## 2014-10-21 NOTE — Patient Instructions (Addendum)
Ms. Perona it was nice seeing you today.  -STOP taking Hydrocodone  -Start taking Diclofenac 75 mg: 1 tablet by mouth twice daily  -Start taking Hydrochlorothiazide 12.5 mg: 1 capsule by mouth daily   -I have referred you to pulmonology. Our office will call you with an appointment date.

## 2014-10-22 DIAGNOSIS — Z889 Allergy status to unspecified drugs, medicaments and biological substances status: Secondary | ICD-10-CM | POA: Insufficient documentation

## 2014-10-22 NOTE — Assessment & Plan Note (Signed)
As per patient, her stress incontinence is better since her cough has improved. She is currently on Oxybutynin 5 mg QID and states her symptoms are well controlled. During her previous visits I educated patient about Kegel exercises. Patient states she has tried doing them but believes only the medication helps.  -Oxybutynin refilled today -Reassess symptoms during future visits. When patient's asthma is better controlled and her cough resolves, consider reducing dose of Oxybutynin.

## 2014-10-22 NOTE — Assessment & Plan Note (Signed)
Patient states she is using Symbicort twice daily as instructed and is using Proair 5-6 times on certain days when her wheezing, SOB, and cough are worse. She is also on Singulair 10 mg daily. States she has no problem walking or carrying out her ADLs. I explained to the patient her Asthma is not well controlled because she should not be requiring the rescue inhaler this frequently. Patient denies any prior hospitalizations due to her asthma. -Pulmonology referral for uncontrolled asthma -Reassess symptoms at f/u visit

## 2014-10-22 NOTE — Assessment & Plan Note (Signed)
X ray of lumbar spine from 08/25/14 showing Multilevel degenerative disc disease and lumbar spondylosis. Patient is currently on Hydrocodone 5-325 1 tablet BID but still continues to complain of R sided lumbar back pain. No radiation of the pain to the legs. No "alarm symptoms" such as urinary or fecal incontinence. No focal neurological symptoms such as weakness, numbness, or tingling. Physical exam - strength and sensation intact in bilateral lower extremities.  -Hydrocodone stopped -Patient started on oral Diclofenac EC 75 mg BID -reassess symptoms at next visit

## 2014-10-22 NOTE — Progress Notes (Signed)
Patient ID: Crystal Briggs, female   DOB: 24-Jun-1965, 49 y.o.   MRN: [id number]   Subjective:   Patient ID: Crystal Briggs female   DOB: [birth year] 49 y.o.   MRN: [id number]  HPI: [name] is a 49 y.o. F with a PMHx of asthma, HTN, HLD, stress incontinence, chronic lower back pain, and tobacco use presenting to the clinic today for a follow-up. Please see the A&P section for the status of the patient's chronic medical problems.     Past Medical History  Diagnosis Date  . Hypertension   . Tobacco abuse 03/30/2012  . Stress incontinence, female 03/30/2012  . Seasonal allergies 03/30/2012  . Essential hypertension, benign 03/30/2012  . Asthma, chronic 03/30/2012   Current Outpatient Prescriptions  Medication Sig Dispense Refill  . acetaminophen-codeine (TYLENOL #3) 300-30 MG per tablet Take 1 tablet by mouth at bedtime as needed (for cough). 14 tablet 0  . albuterol (PROVENTIL HFA;VENTOLIN HFA) 108 (90 BASE) MCG/ACT inhaler Inhale 2 puffs into the lungs every 6 (six) hours as needed for wheezing or shortness of breath. 1 Inhaler 12  . amLODipine (NORVASC) 10 MG tablet Take 1 tablet (10 mg total) by mouth daily. 90 tablet 3  . amLODipine (NORVASC) 10 MG tablet Take 1 tablet (10 mg total) by mouth daily. 90 tablet 3  . budesonide-formoterol (SYMBICORT) 160-4.5 MCG/ACT inhaler INHALE 2 PUFFS INTO THE LUNGS 2 (TWO) TIMES DAILY. 30.6 Inhaler 5  . cetirizine (ZYRTEC) 10 MG tablet Take 1 tablet (10 mg total) by mouth daily. 90 tablet 1  . diclofenac (VOLTAREN) 75 MG EC tablet Take 1 tablet (75 mg total) by mouth 2 (two) times daily. 60 tablet 2  . fluticasone (FLONASE) 50 MCG/ACT nasal spray PLACE 2 SPRAYS INTO BOTH NOSTRILS DAILY. 16 g 3  . hydrochlorothiazide (MICROZIDE) 12.5 MG capsule Take 1 capsule (12.5 mg total) by mouth daily. 30 capsule 2  . lovastatin (MEVACOR) 20 MG tablet Take 1 tablet (20 mg total) by mouth daily before breakfast. 90 tablet 3  . montelukast (SINGULAIR) 10  MG tablet Take 1 tablet (10 mg total) by mouth at bedtime. 30 tablet 2  . montelukast (SINGULAIR) 10 MG tablet TAKE 1 TABLET BY MOUTH EVERY DAY AT BEDTIME 90 tablet 4  . oxybutynin (DITROPAN) 5 MG tablet TAKE 1 TABLET FOUR TIMES A DAY 120 tablet 0   No current facility-administered medications for this visit.   No family history on file. Social History   Social History  . Marital Status: Single    Spouse Name: N/A  . Number of Children: N/A  . Years of Education: N/A   Social History Main Topics  . Smoking status: Current Every Day Smoker -- 0.50 packs/day for 20 years    Types: Cigarettes  . Smokeless tobacco: Never Used     Comment: SMOKE ABOUT12 CIGARETTES A DAY  . Alcohol Use: No  . Drug Use: No  . Sexual Activity: Not Asked   Other Topics Concern  . None   Social History Narrative   Review of Systems: Review of Systems  Constitutional: Negative for fever and chills.  HENT: Negative for ear pain.   Eyes: Negative for blurred vision and pain.  Respiratory: Positive for cough, shortness of breath and wheezing. Negative for hemoptysis and sputum production.   Cardiovascular: Negative for chest pain, palpitations and leg swelling.  Gastrointestinal: Negative for nausea, vomiting, abdominal pain, diarrhea and constipation.  Genitourinary: Negative for dysuria, urgency and frequency.  Musculoskeletal: Positive  for back pain. Negative for myalgias.  Skin: Negative for itching and rash.  Neurological: Negative for dizziness, sensory change, focal weakness and headaches.   Objective:  Physical Exam: Filed Vitals:   10/21/14 1051  BP: 154/72  Pulse: 81  Temp: 98.5 F (36.9 C)  TempSrc: Oral  Height:  (1.549 m)  Weight: 220 lb 14.4 oz (100.2 kg)  SpO2: 100%   Physical Exam  Constitutional: She is oriented to person, place, and time. She appears well-developed and well-nourished. No distress.  HENT:  Head: Normocephalic and atraumatic.  Eyes: EOM are normal.  Pupils are equal, round, and reactive to light.  Neck: Neck supple. No tracheal deviation present.  Cardiovascular: Normal rate, regular rhythm and intact distal pulses.   Pulmonary/Chest: Effort normal. No respiratory distress. She has no wheezes. She has no rales.  Abdominal: Soft. Bowel sounds are normal. She exhibits no distension. There is no tenderness.  Musculoskeletal: She exhibits no edema.  Lymphadenopathy:    She has no cervical adenopathy.  Neurological: She is alert and oriented to person, place, and time.  Strength and sensation grossly intact in bilateral lower extremities.   Skin: Skin is warm and dry.   Assessment & Plan:

## 2014-10-22 NOTE — Assessment & Plan Note (Signed)
Patient states her cough has improved but she is still sneezing and has a runny nose. No fevers, chills, or fatigue. Her symptoms could likely be due to a viral URI vs chronic seasonal allergies.  -Continue daily Cetrizine

## 2014-10-22 NOTE — Assessment & Plan Note (Signed)
BP Readings from Last 3 Encounters:  10/21/14 154/72  09/20/14 147/73  08/25/14 148/76    Lab Results  Component Value Date   NA 138 08/18/2014   K 4.4 08/18/2014   CREATININE 0.59 08/18/2014    Assessment: Blood pressure control:  uncontrolled  Progress toward BP goal:   above goal (<140/90) Comments: Patient is currently taking Amlodipine 10 mg qd.  Plan: Medications: Continue Amlodipine as above. Added HCTZ 12.5 mg qd to her medication regimen.  Educational resources provided:  Encouraged healthy eating and exercise.  Self management tools provided:  Instructions for home BP monitoring.  Other plans: Reassess at next visit.

## 2014-10-22 NOTE — Assessment & Plan Note (Signed)
Patient complaining of rhinorrhea and sneezing. -Continue Cetrizine 10 mg qd

## 2014-10-24 ENCOUNTER — Other Ambulatory Visit: Payer: Self-pay | Admitting: Internal Medicine

## 2014-10-24 NOTE — Progress Notes (Signed)
Internal Medicine Clinic Attending  I saw and evaluated the patient.  I personally confirmed the key portions of the history and exam documented by Dr. Loney Loh and I reviewed pertinent patient test results.  The assessment, diagnosis, and plan were formulated together and I agree with the documentation in the resident's note.  Very difficult to gauge our control of her asthma. We need spirometry with pre and post bronchodilatory readings. If we confirm the diagnosis of asthma, we should teach her about peak flow measurements. We are going to try to obtain spirometry and refer her to pulmonology because it has been a difficult case for [place] to manage here.

## 2014-10-24 NOTE — Telephone Encounter (Signed)
Pt called requesting diclofenace and albuterol inhaler to be filled @ [facility]

## 2014-10-25 ENCOUNTER — Telehealth: Payer: Self-pay | Admitting: *Deleted

## 2014-10-25 MED ORDER — MELOXICAM 7.5 MG PO TABS: 7.5000 mg | ORAL_TABLET | Freq: Every day | ORAL | Status: DC

## 2014-10-25 MED ORDER — ALBUTEROL SULFATE HFA 108 (90 BASE) MCG/ACT IN AERS: 2.0000 | INHALATION_SPRAY | Freq: Four times a day (QID) | RESPIRATORY_TRACT | Status: DC | PRN

## 2014-10-25 NOTE — Telephone Encounter (Addendum)
Received faxed PA request from pt's pharmacy for diclofenac sodium. Medication non-preferred, will forward request to pcp for review, please advise. ( see list below of preferred meds below).Crystal Spittle Cassady10/11/201611:33 AM   NSAIDS (Medicaid) Preferred             ibuprofen suspension / tablet (generic for Motrin)         indomethacin capsule (generic for Indocin)                meloxicam tablet (generic for Mobic Tablet)       naproxen EC tablet (generic for Naprosyn EC)       naproxen sodium tablet (generic for Anaprox)        naproxen tablet (generic for Naprosyn Tablet)           sulindac tablet (generic for Clinoril)   ketorolac tablet (generic for Toradol)

## 2014-10-25 NOTE — Telephone Encounter (Deleted)
Received faxed PA request from pt's pharmacy for diclofenac sodium.  Medication is non-preferred. Preferred meds         NSAIDS (Medicaid) Preferred            Non-Preferred ibuprofen suspension / tablet (generic for Motrin)       Anaprox Tablet / DS Tablet      Indocin Suppository / Suspension indomethacin capsule (generic for Indocin)       Arthrotec Tablet       indomethacin ER capsule (generic for Indocin SR) ketorolac tablet (generic for Toradol)        DayPro Caplet       ketoprofen capsule (generic for Orudis) meloxicam tablet (generic for Mobic Tablet)      diclofenac potassium tablet (generic for Cataflam)   ketoprofen ER capsule (generic for Oruvail) naproxen EC tablet (generic for Naprosyn EC)       diclofenac sodium tablet / ER tablet (generic for Voltaren / XR) meclofenamate capsule (generic for Meclomen) naproxen sodium tablet (generic for Anaprox)       diclofenac sodium-misoprostol tablet (generic for Arthrotec) mefenamic acid capsule (generic for Ponstel) naproxen tablet (generic for Naprosyn Tablet)       diflunisal tablet (generic for Dolobid)    Mobic Tablet sulindac tablet (generic for Clinoril)        EC-Naprosyn Tablet       nabumetone tablet (generic for Relafen)                   etodolac capsule / tablet / ER tablet(generic for Lodine / XL) Nalfon Capsule             Feldene Capsule       Naprelan Tablet             fenoprofen tablet (generic for Nalfon)    Naprosyn Tablet             flurbiprofen tablet (generic for Ansaid)    naproxen suspension (generic for Naprosyn Suspension)                oxaprozin tablet (generic for DayPro)    naproxen sodium ER tablet (generic for Naprelan)             piroxicam capsule (generic for Feldene)    Ponstel Kapseals             Sprix Nasal Spray       tolmetin capsule / tablet (generic for Tolectin)             Voltaren XR Tablet       Zipsor Capsule             Zorvolex Capsule

## 2014-10-27 ENCOUNTER — Other Ambulatory Visit: Payer: Self-pay | Admitting: Internal Medicine

## 2014-10-31 ENCOUNTER — Other Ambulatory Visit: Payer: Self-pay | Admitting: Internal Medicine

## 2014-11-03 ENCOUNTER — Ambulatory Visit (INDEPENDENT_AMBULATORY_CARE_PROVIDER_SITE_OTHER)
Admission: RE | Admit: 2014-11-03 | Discharge: 2014-11-03 | Disposition: A | Discharge status: Home / Self Care | Payer: Medicaid Other | Source: Ambulatory Visit | Attending: Internal Medicine | Admitting: Internal Medicine

## 2014-11-03 ENCOUNTER — Telehealth: Payer: Self-pay | Admitting: Internal Medicine

## 2014-11-03 ENCOUNTER — Encounter: Payer: Self-pay | Admitting: Internal Medicine

## 2014-11-03 ENCOUNTER — Ambulatory Visit (INDEPENDENT_AMBULATORY_CARE_PROVIDER_SITE_OTHER): Payer: Medicaid Other | Admitting: Internal Medicine

## 2014-11-03 VITALS — BP 136/76 | HR 89 | Ht 61.0 in | Wt 216.8 lb

## 2014-11-03 DIAGNOSIS — Z720 Tobacco use: Secondary | ICD-10-CM

## 2014-11-03 DIAGNOSIS — Z23 Encounter for immunization: Secondary | ICD-10-CM

## 2014-11-03 DIAGNOSIS — J4530 Mild persistent asthma, uncomplicated: Secondary | ICD-10-CM | POA: Diagnosis not present

## 2014-11-03 DIAGNOSIS — F17210 Nicotine dependence, cigarettes, uncomplicated: Secondary | ICD-10-CM

## 2014-11-03 MED ORDER — PANTOPRAZOLE SODIUM 40 MG PO TBEC: 40.0000 mg | DELAYED_RELEASE_TABLET | Freq: Every day | ORAL | Status: DC

## 2014-11-03 MED ORDER — FAMOTIDINE 20 MG PO TABS: ORAL_TABLET | ORAL | Status: DC

## 2014-11-03 NOTE — Telephone Encounter (Signed)
Pt seen @ By Dr. Sherene SiresWert  [facility]  this morning and has some questions about the visit she would like to ask her Dr.

## 2014-11-03 NOTE — Progress Notes (Signed)
Subjective:    Patient ID: Crystal Briggs, female    DOB: Dec 25, 1965,    MRN: [id number]  HPI  13 yobf active smoker no problem as child, young adult but around 2009 noted onset of sob >> dx asthma, better with inhalers but worse noct spells cough/wheeze> referred 11/03/2014 to pulmonary clinic 11/03/2014 by Dr Loney Loh   11/03/2014 1st [facility] office visit/ Neshia Mckenzie  Poor control of asthma symptoms x one year Chief Complaint  Patient presents with  . Pulmonary Consult    Referred by Dr. John Giovanni. Pt c/o SOB since 2009, worse for the past year. She feels SOB first thing in the am and then when she is working.  She also c/o cough, esp worse at night, prod with clear sputum.   symptoms of cough and sob present daily x one year, already on symbicort but poor hfa and yet still sob - much better p saba rx but having much more noct spells of cough/wheeze/ sob s purulent sputum  No obvious other patterns in day to day or daytime variabilty or assoc  or cp or chest tightness, subjective wheeze overt sinus or hb symptoms. No unusual exp hx or h/o childhood pna/ asthma or knowledge of premature birth.  Sleeping ok without nocturnal  or early am exacerbation  of respiratory  c/o's or need for noct saba. Also denies any obvious fluctuation of symptoms with weather or environmental changes or other aggravating or alleviating factors except as outlined above   Current Medications, Allergies, Complete Past Medical History, Past Surgical History, Family History, and Social History were reviewed in [employer] record.          Review of Systems  Constitutional: Negative for fever, chills and unexpected weight change.  HENT: Positive for congestion. Negative for dental problem, ear pain, nosebleeds, postnasal drip, rhinorrhea, sinus pressure, sneezing, sore throat, trouble swallowing and voice change.   Eyes: Negative for visual disturbance.  Respiratory:  Positive for cough and shortness of breath. Negative for choking.   Cardiovascular: Negative for chest pain and leg swelling.  Gastrointestinal: Negative for vomiting, abdominal pain and diarrhea.  Genitourinary: Negative for difficulty urinating.  Musculoskeletal: Negative for arthralgias.  Skin: Negative for rash.  Neurological: Negative for tremors, syncope and headaches.  Hematological: Does not bruise/bleed easily.       Objective:   Physical Exam  amb bf nad   Wt Readings from Last 3 Encounters:  11/03/14 216 lb 12.8 oz (98.34 kg)  10/21/14 220 lb 14.4 oz (100.2 kg)  09/20/14 222 lb 6.4 oz (100.88 kg)    Vital signs reviewed   HEENT: nl dentition, turbinates, and orophanx. Nl external ear canals without cough reflex   NECK :  without JVD/Nodes/TM/ nl carotid upstrokes bilaterally   LUNGS: no acc muscle use, clear to A and P bilaterally without cough on insp or exp maneuvers   CV:  RRR  no s3 or murmur or increase in P2, no edema   ABD:  soft and nontender with nl excursion in the supine position. No bruits or organomegaly, bowel sounds nl  MS:  warm without deformities, calf tenderness, cyanosis or clubbing  SKIN: warm and dry without lesions    NEURO:  alert, approp, no deficits   CXR PA and Lateral:   11/03/2014 :    I personally reviewed images and agree with radiology impression as follows:    Diffuse mild bilateral pulmonary interstitial prominence consistent with pneumonitis.  My review:  No def ILD          Assessment & Plan:

## 2014-11-03 NOTE — Telephone Encounter (Signed)
Patient was referred to Pulmonology for management of uncontrolled asthma. I had discussed this with her prior to placing the referral and she agreed. It looks like CXR was ordered by Dr. Sherene SiresWert, please ask patient to call his office to follow up the results.

## 2014-11-03 NOTE — Telephone Encounter (Signed)
Spoke with patient.  She was unsure of why she was sent to Pulmonology office.   Advised pt that her dr wanted to be sure there were no underlying causes of her cough/sob that she has reported.   Chest x-ray was done and her doctor will review this report. FYI

## 2014-11-03 NOTE — Assessment & Plan Note (Signed)

## 2014-11-03 NOTE — Assessment & Plan Note (Signed)
Symptoms are markedly disproportionate to objective findings and not clear this is a lung problem but pt does appear to have difficult airway management issues. DDX of  difficult airways management all start with A and  include Adherence, Ace Inhibitors, Acid Reflux, Active Sinus Disease, Alpha 1 Antitripsin deficiency, Anxiety masquerading as Airways dz,  ABPA,  allergy(esp in young), Aspiration (esp in elderly), Adverse effects of meds,  Active smokers, A bunch of PE's (a small clot burden can't cause this syndrome unless there is already severe underlying pulm or vascular dz with poor reserve) plus two Bs  = Bronchiectasis and Beta blocker use..and one C= CHF  Adherence is always the initial "prime suspect" and is a multilayered concern that requires a "trust but verify" approach in every patient - starting with knowing how to use medications, especially inhalers, correctly, keeping up with refills and understanding the fundamental difference between maintenance and prns vs those medications only taken for a very short course and then stopped and not refilled.  -The proper method of use, as well as anticipated side effects, of a metered-dose inhaler are discussed and demonstrated to the patient. Improved effectiveness after extensive coaching during this visit to a level of approximately  75%   Active smoking the other obvious concern > see sep A/P  ? Acid (or non-acid) GERD > always difficult to exclude as up to 75% of pts in some series report no assoc GI/ Heartburn symptoms> rec max (24h)  acid suppression and diet restrictions/ reviewed and instructions given in writing.    I had an extended discussion with the patient reviewing all relevant studies completed to date and  lasting 35 minutes of a 60 minute visit    Each maintenance medication was reviewed in detail including most importantly the difference between maintenance and prns and under what circumstances the prns are to be triggered using  an action plan format that is not reflected in the computer generated alphabetically organized AVS.    Please see instructions for details which were reviewed in writing and the patient given a copy highlighting the part that I personally wrote and discussed at today's ov.

## 2014-11-03 NOTE — Patient Instructions (Addendum)
Pantoprazole (protonix) 40 mg   Take  30-60 min before first meal of the day and Pepcid (famotidine)  20 mg one @  bedtime until return to office - this is the best way to tell whether stomach acid is contributing to your problem.    GERD (REFLUX)  is an extremely common cause of respiratory symptoms just like yours , many times with no obvious heartburn at all.    It can be treated with medication, but also with lifestyle changes including elevation of the head of your bed (ideally with 6 inch  bed blocks),  Smoking cessation, avoidance of late meals, excessive alcohol, and avoid fatty foods, chocolate, peppermint, colas, red wine, and acidic juices such as orange juice.  NO MINT OR MENTHOL PRODUCTS SO NO COUGH DROPS  USE SUGARLESS CANDY INSTEAD (Jolley ranchers or Stover's or Life Savers) or even ice chips will also do - the key is to swallow to prevent all throat clearing. NO OIL BASED VITAMINS - use powdered substitutes.  Work on inhaler technique:  relax and gently blow all the way out then take a nice smooth deep breath back in, triggering the inhaler at same time you start breathing in.  Hold for up to 5 seconds if you can. Blow out thru nose. Rinse and gargle with water when done  Please remember to go to the   x-ray department downstairs for your tests - we will call you with the results when they are available.     Please schedule a follow up office visit in 6 weeks, call sooner if needed with pfts  Add walking sats next ov

## 2014-11-04 ENCOUNTER — Other Ambulatory Visit: Payer: Self-pay | Admitting: Internal Medicine

## 2014-11-04 ENCOUNTER — Encounter: Payer: Self-pay | Admitting: Internal Medicine

## 2014-11-04 MED ORDER — FLUTICASONE PROPIONATE 50 MCG/ACT NA SUSP: 2.0000 | Freq: Every day | NASAL | Status: DC

## 2014-11-04 NOTE — Assessment & Plan Note (Signed)
Body mass index is 40.99 kg/(m^2).  Lab Results  Component Value Date   TSH 2.097 04/08/2007     Contributing to gerd tendency/ doe/reviewed the need and the process to achieve and maintain neg calorie balance > defer f/u primary care including intermittently monitoring thyroid status

## 2014-11-04 NOTE — Telephone Encounter (Signed)
Pt need the flonase nasal spray by today. She will go out of town tomorrow. Please call pt back.

## 2014-11-04 NOTE — Telephone Encounter (Signed)
Pt requesting flonase nasal spray to be filled.

## 2014-11-26 ENCOUNTER — Other Ambulatory Visit: Payer: Self-pay | Admitting: Internal Medicine

## 2014-11-28 ENCOUNTER — Other Ambulatory Visit: Payer: Self-pay

## 2014-11-28 ENCOUNTER — Telehealth: Payer: Self-pay | Admitting: Internal Medicine

## 2014-11-28 NOTE — Telephone Encounter (Signed)
Oxybutynin sent via surescripts and patient should have refill on hctz.

## 2014-11-28 NOTE — Telephone Encounter (Signed)
Requests sent to PCP.  Tried to call patient back to inform that she should have refill on hctz but number is disconnected.

## 2014-11-28 NOTE — Telephone Encounter (Signed)
Pt requesting acetaminophen, oxybutynin and hydrochlorothiazide to be filled @ [facility].

## 2014-12-01 ENCOUNTER — Other Ambulatory Visit: Payer: Self-pay | Admitting: Internal Medicine

## 2014-12-02 ENCOUNTER — Other Ambulatory Visit: Payer: Self-pay | Admitting: *Deleted

## 2014-12-02 NOTE — Telephone Encounter (Signed)
Pt states she thought you would prescribe the ty#3 for her back pain when she stopped getting the hydrocodone, she has made an appt to come in at your first open appt, 12/20 at 1515 and ask that you fill the ty#3 until the appt

## 2014-12-03 NOTE — Telephone Encounter (Signed)
This medication was prescribed for cough. Please ask patient to return to the clinic to discuss her back pain.

## 2014-12-05 NOTE — Telephone Encounter (Signed)
Tried to call pt to inform of Dr. Mackie Paiathores message regarding tylenol #3, her telephone number has been temporary disconnected.

## 2014-12-16 ENCOUNTER — Ambulatory Visit: Payer: Medicaid Other | Admitting: Internal Medicine

## 2015-01-03 ENCOUNTER — Ambulatory Visit (INDEPENDENT_AMBULATORY_CARE_PROVIDER_SITE_OTHER): Payer: Medicaid Other | Admitting: Internal Medicine

## 2015-01-03 ENCOUNTER — Encounter: Payer: Self-pay | Admitting: Internal Medicine

## 2015-01-03 VITALS — BP 130/70 | HR 80 | Temp 98.1°F | Ht 61.0 in | Wt 219.8 lb

## 2015-01-03 DIAGNOSIS — J069 Acute upper respiratory infection, unspecified: Secondary | ICD-10-CM

## 2015-01-03 DIAGNOSIS — J029 Acute pharyngitis, unspecified: Secondary | ICD-10-CM

## 2015-01-03 DIAGNOSIS — N393 Stress incontinence (female) (male): Secondary | ICD-10-CM | POA: Diagnosis not present

## 2015-01-03 DIAGNOSIS — J028 Acute pharyngitis due to other specified organisms: Secondary | ICD-10-CM

## 2015-01-03 DIAGNOSIS — F17210 Nicotine dependence, cigarettes, uncomplicated: Secondary | ICD-10-CM | POA: Diagnosis not present

## 2015-01-03 DIAGNOSIS — Z0000 Encounter for general adult medical examination without abnormal findings: Secondary | ICD-10-CM

## 2015-01-03 DIAGNOSIS — J45909 Unspecified asthma, uncomplicated: Secondary | ICD-10-CM

## 2015-01-03 DIAGNOSIS — I10 Essential (primary) hypertension: Secondary | ICD-10-CM

## 2015-01-03 DIAGNOSIS — Z7951 Long term (current) use of inhaled steroids: Secondary | ICD-10-CM | POA: Diagnosis not present

## 2015-01-03 DIAGNOSIS — B9789 Other viral agents as the cause of diseases classified elsewhere: Secondary | ICD-10-CM

## 2015-01-03 MED ORDER — GUAIFENESIN ER 600 MG PO TB12
600.0000 mg | ORAL_TABLET | Freq: Two times a day (BID) | ORAL | Status: AC
Start: 2015-01-03 — End: 2016-01-03

## 2015-01-03 MED ORDER — TOLTERODINE TARTRATE ER 4 MG PO CP24: 4.0000 mg | ORAL_CAPSULE | Freq: Every day | ORAL | Status: DC

## 2015-01-03 NOTE — Patient Instructions (Addendum)
Ms. Rayburn MaBlackmon it was nice seeing you today.   -STOP taking Oxybutynin  -Start taking Tolterodine 4 mg once daily for your bladder.   -Take Mucinex 600 mg twice daily for congestion.   -I am referring you to Gynecology today. Our office will set up the appointment for you. Please ask the doctor about referral for pelvic floor physical therapy.   -I am also referring you to Gastroenterology for a screening colonoscopy. Our office will set up the appointment for you.  -Please return for a follow-up visit in 1 month.     Kegel Exercises The goal of Kegel exercises is to isolate and exercise your pelvic floor muscles. These muscles act as a hammock that supports the rectum, vagina, small intestine, and uterus. As the muscles weaken, the hammock sags and these organs are displaced from their normal positions. Kegel exercises can strengthen your pelvic floor muscles and help you to improve bladder and bowel control, improve sexual response, and help reduce many problems and some discomfort during pregnancy. Kegel exercises can be done anywhere and at any time. HOW TO PERFORM KEGEL EXERCISES 1. Locate your pelvic floor muscles. To do this, squeeze (contract) the muscles that you use when you try to stop the flow of urine. You will feel a tightness in the vaginal area (women) and a tight lift in the rectal area (men and women). 2. When you begin, contract your pelvic muscles tight for 2-5 seconds, then relax them for 2-5 seconds. This is one set. Do 4-5 sets with a short pause in between. 3. Contract your pelvic muscles for 8-10 seconds, then relax them for 8-10 seconds. Do 4-5 sets. If you cannot contract your pelvic muscles for 8-10 seconds, try 5-7 seconds and work your way up to 8-10 seconds. Your goal is 4-5 sets of 10 contractions each day. Keep your stomach, buttocks, and legs relaxed during the exercises. Perform sets of both short and long contractions. Vary your positions. Perform these  contractions 3-4 times per day. Perform sets while you are:   Lying in bed in the morning.  Standing at lunch.  Sitting in the late afternoon.  Lying in bed at night. You should do 40-50 contractions per day. Do not perform more Kegel exercises per day than recommended. Overexercising can cause muscle fatigue. Continue these exercises for for at least 15-20 weeks or as directed by your caregiver.   This information is not intended to replace advice given to you by your health care provider. Make sure you discuss any questions you have with your health care provider.   Document Released: 12/18/2011 Document Revised: 01/21/2014 Document Reviewed: 12/18/2011 Elsevier Interactive Patient Education [employer]2016 Elsevier Inc.

## 2015-01-04 ENCOUNTER — Telehealth: Payer: Self-pay

## 2015-01-04 ENCOUNTER — Telehealth: Payer: Self-pay | Admitting: Internal Medicine

## 2015-01-04 ENCOUNTER — Other Ambulatory Visit: Payer: Self-pay | Admitting: Internal Medicine

## 2015-01-04 NOTE — Telephone Encounter (Signed)
Med is detrol, will need PA, pharm is faxing to triage, will place in PA workbox Whenever i call the # below receive a recording that says cannot complete call

## 2015-01-04 NOTE — Telephone Encounter (Signed)
Patient called to give an alternate number [phone].

## 2015-01-04 NOTE — Telephone Encounter (Signed)
Prior auth request initiated for Tolterodine 4mg .  Per Medicaid this is not a preferred medication.  Reference #[id number] given to check status in 24 hours.  Will call [phone] for follow-up tomorrow.

## 2015-01-04 NOTE — Telephone Encounter (Signed)
Pt seen yesterday and is requesting Dr. Loney Lohathore to change her Bladder medication back to the old medicines because her insurance will not pay for the new one.  Patient unable to tell me the name of the the RX.  Number listed to call is her Sister's number [employer](Senita Tonnesen) [phone].

## 2015-01-05 MED ORDER — FESOTERODINE FUMARATE ER 4 MG PO TB24: 4.0000 mg | ORAL_TABLET | Freq: Every day | ORAL | Status: DC

## 2015-01-05 NOTE — Telephone Encounter (Signed)
Called pt to inform of new rx sent to pharmacy, it is preferred with her insurance and she should not have any addition issues.   Thank you Dr. Loney Lohathore

## 2015-01-05 NOTE — Telephone Encounter (Signed)
I have spoken with patient. She called back, another encounter is open, going to close this one.

## 2015-01-05 NOTE — Telephone Encounter (Signed)
Request for Tolterodine denied by insurance.  Pt must try and fail 2 preferred medications.  She has tried Oxybutynin but not a 2nd.  The other preferred medications are Toviaz or Solifenacin.   Would you like to update her RX to one of these Dr. Loney Lohathore?

## 2015-01-05 NOTE — Telephone Encounter (Signed)
Ms. Rayburn MaBlackmon has called multiple times over the last 2 days regarding this medication issue.  Pt reports running out of the oxybutynin and is now aware that insurance will not pay for the newest ordered drug until she tries another preferred med.  Pt aware that PCP has this information to review, however pt concerned that she will be without medication for the long weekend.  Pt states multiple times she is OK staying on the oxybutynin and reports she just asked to be changed to try something new.   Sending to attending pool since PCP is not in clinic

## 2015-01-05 NOTE — Telephone Encounter (Signed)
Called [phone] - unable to leave message. Tried to call home phone number - left message for pt to call clinic.

## 2015-01-06 NOTE — Assessment & Plan Note (Signed)
Patient denies having any hematemesis, hematochezia, or melena. -GI referral for screening colonoscopy

## 2015-01-06 NOTE — Assessment & Plan Note (Signed)
Patient continues to smoke 1/2 PPD and is not interested in quitting at this time.  -Reassess her readiness to quit at next visit

## 2015-01-06 NOTE — Assessment & Plan Note (Signed)
Patient reports having a history of urinary incontinence since 2009. She had 2 vaginal deliveries but last pregnancy was 22 years ago. She is currently on Oxybutynin 5 mg QID but still continues to have incontinence when sneezing or coughing. States her symptoms ae well controlled during the day but she has to wear a diaper at night as she continues to have nighttime leaks. States she has been doing kegel exercises. Patient denies having any dysuria or urinary frequency. A1c 5.4 in 08/2014.  -Oxybutynin discontinued  -I prescribed her Tolteradine ER 4 mg QD but was later informed this drug is not covered by her insurance. As such, she has been switched to Fesoterodine 4 mg QD because it is on the insurance formulary.  -Patient haas also been referred to gynecology because I believe she would benefit greatly from a referral to pelvic floor physical therapy.  -Meanwhile, I asked patient to continue doing Kegel exercises on a regular basis  -Reassess symptoms at next visit

## 2015-01-06 NOTE — Assessment & Plan Note (Signed)
Patient currently following up with pulmonologist Dr. Sherene SiresWert. States her asthma is well controlled. Patient still continues to have some cough but states it has improved significantly. She denies having any wheezing or SOB. States Dr. Sherene SiresWert prescribed her Pantoprazole 40 mg QD and Famotidine 20 mg QD. She denies having any GERD symptoms at present. States she is scheduled for PFTs in 01/2015.  -I encouraged her to go to all her appointments with Dr. Sherene SiresWert and go to for her PFTs in Jan 2017 -Continue Pantoprazole and Famotidine -Continue Albuterol, Symbicort, and Singulair

## 2015-01-06 NOTE — Assessment & Plan Note (Signed)
BP Readings from Last 3 Encounters:  01/03/15 130/70  11/03/14 136/76  10/21/14 154/72    Lab Results  Component Value Date   NA 138 08/18/2014   K 4.4 08/18/2014   CREATININE 0.59 08/18/2014    Assessment: Blood pressure control:  controlled  Progress toward BP goal:   improved   Comments: Patient is currently on Amlodipine 10 mg daily and HCTZ 12.5 mg daily.  Plan: Medications:  continue current medications Educational resources provided:  Encouraged healthy eating and exercise.

## 2015-01-06 NOTE — Assessment & Plan Note (Signed)
Patient has a history of chronic allergies and asthma. At present she is complaining of nasal congestion and cough for the past few weeks. Reports having clear nasal secretions and cough occasionally productive of minimal yellowish appearing sputum. Denies having any sinus pain or pressure. Denies having any fevers, chills, SOB, wheezing, myalgias, fatigue, nausea, or vomiting. Her symptoms are likely due to a viral URI.  -Mucinex for nasal congestion -Continue Cetrizine and Flonase for allergies

## 2015-01-06 NOTE — Progress Notes (Signed)
Patient ID: DEETYA Briggs, female   DOB: [birth year], 49 y.o.   MRN: [id number]   Subjective:   Patient ID: Crystal Briggs female   DOB: 08-12-1965 49 y.o.   MRN: [id number]  HPI: [name] is a 49 y.o. F with a PMHx of HTN, stress incontinence, seasonal allergies, and asthma presenting to the clinic for a follow up of her HTN and stress incontinence. Please see assessment and plan for the status of the patient's chronic medical conditions.     Past Medical History  Diagnosis Date  . Hypertension   . Tobacco abuse 03/30/2012  . Stress incontinence, female 03/30/2012  . Seasonal allergies 03/30/2012  . Essential hypertension, benign 03/30/2012  . Asthma, chronic 03/30/2012   Current Outpatient Prescriptions  Medication Sig Dispense Refill  . acetaminophen-codeine (TYLENOL #3) 300-30 MG per tablet Take 1 tablet by mouth at bedtime as needed (for cough). 14 tablet 0  . albuterol (PROVENTIL HFA;VENTOLIN HFA) 108 (90 BASE) MCG/ACT inhaler Inhale 2 puffs into the lungs every 6 (six) hours as needed for wheezing or shortness of breath. 1 Inhaler 3  . amLODipine (NORVASC) 10 MG tablet Take 1 tablet (10 mg total) by mouth daily. 90 tablet 3  . budesonide-formoterol (SYMBICORT) 160-4.5 MCG/ACT inhaler INHALE 2 PUFFS INTO THE LUNGS 2 (TWO) TIMES DAILY. 30.6 Inhaler 5  . cetirizine (ZYRTEC) 10 MG tablet Take 1 tablet (10 mg total) by mouth daily. 90 tablet 1  . famotidine (PEPCID) 20 MG tablet One at bedtime 30 tablet 2  . fesoterodine (TOVIAZ) 4 MG TB24 tablet Take 1 tablet (4 mg total) by mouth daily. 30 tablet 3  . fluticasone (FLONASE) 50 MCG/ACT nasal spray Place 2 sprays into both nostrils daily. 16 g 3  . guaiFENesin (MUCINEX) 600 MG 12 hr tablet Take 1 tablet (600 mg total) by mouth 2 (two) times daily. 20 tablet 0  . hydrochlorothiazide (MICROZIDE) 12.5 MG capsule Take 1 capsule (12.5 mg total) by mouth daily. 30 capsule 2  . lovastatin (MEVACOR) 20 MG tablet Take 1 tablet (20  mg total) by mouth daily before breakfast. 90 tablet 3  . meloxicam (MOBIC) 7.5 MG tablet Take 1 tablet (7.5 mg total) by mouth daily. 30 tablet 2  . montelukast (SINGULAIR) 10 MG tablet TAKE 1 TABLET BY MOUTH EVERY DAY AT BEDTIME 90 tablet 4  . pantoprazole (PROTONIX) 40 MG tablet Take 1 tablet (40 mg total) by mouth daily. Take 30-60 min before first meal of the day 30 tablet 2   No current facility-administered medications for this visit.   No family history on file. Social History   Social History  . Marital Status: Single    Spouse Name: N/A  . Number of Children: N/A  . Years of Education: N/A   Social History Main Topics  . Smoking status: Current Every Day Smoker -- 0.50 packs/day for 20 years    Types: Cigarettes  . Smokeless tobacco: Never Used  . Alcohol Use: No  . Drug Use: No  . Sexual Activity: Not Asked   Other Topics Concern  . None   Social History Narrative   Review of Systems: Review of Systems  Constitutional: Negative for fever, chills and malaise/fatigue.  HENT: Positive for congestion. Negative for sore throat.   Eyes: Negative for blurred vision and pain.  Respiratory: Positive for cough and sputum production. Negative for shortness of breath and wheezing.   Cardiovascular: Negative for chest pain, palpitations and leg swelling.  Gastrointestinal: Negative  for nausea, vomiting, abdominal pain and diarrhea.  Genitourinary: Negative for dysuria and frequency.  Musculoskeletal: Negative for myalgias and joint pain.  Skin: Negative for itching and rash.  Neurological: Negative for dizziness, sensory change, focal weakness and headaches.   Objective:  Physical Exam: Filed Vitals:   01/03/15 1517 01/03/15 1632  BP: 141/78 130/70  Pulse: 84 80  Temp: 98.1 F (36.7 C)   TempSrc: Oral   Height: 5\' 1"  (1.549 m)   Weight: 99.701 kg (219 lb 12.8 oz)   SpO2: 100%    Physical Exam  Constitutional: She is oriented to person, place, and time. She  appears well-developed and well-nourished. No distress.  HENT:  Head: Normocephalic and atraumatic.  No tenderness on palpation of sinuses.   Eyes: EOM are normal. Pupils are equal, round, and reactive to light.  Neck: Neck supple. No tracheal deviation present.  Cardiovascular: Normal rate, regular rhythm and intact distal pulses.  Exam reveals no gallop and no friction rub.   No murmur heard. Pulmonary/Chest: Effort normal and breath sounds normal. No respiratory distress. She has no wheezes. She has no rales.  Abdominal: Soft. Bowel sounds are normal. She exhibits no distension. There is no tenderness.  Musculoskeletal: She exhibits no edema.  Neurological: She is alert and oriented to person, place, and time.  Skin: Skin is warm and dry.   Assessment & Plan:

## 2015-01-10 NOTE — Progress Notes (Signed)
Internal Medicine Clinic Attending  I saw and evaluated the patient.  I personally confirmed the key portions of the history and exam documented by Dr. Rathore and I reviewed pertinent patient test results.  The assessment, diagnosis, and plan were formulated together and I agree with the documentation in the resident's note.  

## 2015-01-29 ENCOUNTER — Other Ambulatory Visit: Payer: Self-pay | Admitting: Internal Medicine

## 2015-02-01 DIAGNOSIS — R32 Unspecified urinary incontinence: Secondary | ICD-10-CM | POA: Insufficient documentation

## 2015-02-03 ENCOUNTER — Ambulatory Visit: Payer: Medicaid Other | Admitting: Internal Medicine

## 2015-02-07 NOTE — Addendum Note (Signed)
Addended by: Neomia Dear on: 02/07/2015 08:37 PM   Modules accepted: Orders

## 2015-02-13 ENCOUNTER — Other Ambulatory Visit: Payer: Self-pay | Admitting: Internal Medicine

## 2015-02-24 ENCOUNTER — Other Ambulatory Visit: Payer: Self-pay | Admitting: Internal Medicine

## 2015-02-24 NOTE — Telephone Encounter (Signed)
Patient requesting a refill on hr Flonase

## 2015-02-27 MED ORDER — FLUTICASONE PROPIONATE 50 MCG/ACT NA SUSP: 2.0000 | Freq: Every day | NASAL | Status: DC

## 2015-03-18 ENCOUNTER — Other Ambulatory Visit: Payer: Self-pay | Admitting: Internal Medicine

## 2015-03-24 ENCOUNTER — Encounter: Payer: Self-pay | Admitting: Internal Medicine

## 2015-03-24 ENCOUNTER — Ambulatory Visit (INDEPENDENT_AMBULATORY_CARE_PROVIDER_SITE_OTHER): Payer: Medicaid Other | Admitting: Internal Medicine

## 2015-03-24 VITALS — BP 150/84 | HR 97 | Temp 98.3°F | Ht 60.0 in | Wt 228.0 lb

## 2015-03-24 DIAGNOSIS — J4530 Mild persistent asthma, uncomplicated: Secondary | ICD-10-CM | POA: Diagnosis not present

## 2015-03-24 DIAGNOSIS — F17210 Nicotine dependence, cigarettes, uncomplicated: Secondary | ICD-10-CM | POA: Diagnosis not present

## 2015-03-24 LAB — PULMONARY FUNCTION TEST
DL/VA % pred: 112 %
DL/VA: 4.76 ml/min/mmHg/L
DLCO COR % PRED: 83 %
DLCO UNC: 15.78 ml/min/mmHg
DLCO cor: 15.83 ml/min/mmHg
DLCO unc % pred: 83 %
FEF 25-75 POST: 0.99 L/s
FEF 25-75 Pre: 0.76 L/sec
FEF2575-%Change-Post: 29 %
FEF2575-%Pred-Post: 45 %
FEF2575-%Pred-Pre: 35 %
FEV1-%Change-Post: 8 %
FEV1-%PRED-PRE: 59 %
FEV1-%Pred-Post: 64 %
FEV1-POST: 1.26 L
FEV1-PRE: 1.16 L
FEV1FVC-%Change-Post: -2 %
FEV1FVC-%Pred-Pre: 87 %
FEV6-%CHANGE-POST: 11 %
FEV6-%PRED-PRE: 69 %
FEV6-%Pred-Post: 77 %
FEV6-Post: 1.84 L
FEV6-Pre: 1.64 L
FEV6FVC-%PRED-PRE: 103 %
FEV6FVC-%Pred-Post: 103 %
FVC-%CHANGE-POST: 11 %
FVC-%Pred-Post: 74 %
FVC-%Pred-Pre: 67 %
FVC-POST: 1.84 L
FVC-PRE: 1.64 L
PRE FEV1/FVC RATIO: 71 %
Post FEV1/FVC ratio: 69 %
Post FEV6/FVC ratio: 100 %
Pre FEV6/FVC Ratio: 100 %
RV % pred: 120 %
RV: 1.93 L
TLC % pred: 85 %
TLC: 3.8 L

## 2015-03-24 NOTE — Progress Notes (Signed)
PFT done today. 

## 2015-03-24 NOTE — Patient Instructions (Addendum)
Plan A = Automatic =  Symbicort 160 Take 2 puffs first thing in am and then another 2 puffs about 12 hours later.   Plan B = Backup Only use your albuterol as a rescue medication to be used if you can't catch your breath by resting or doing a relaxed purse lip breathing pattern.  - The less you use it, the better it will work when you need it. - Ok to use up to 2 puffs  every 4 hours if you must but call for appointment if use goes up over your usual need - Don't leave home without it !!  (think of it like the spare tire for your car)     GERD (REFLUX)  is an extremely common cause of respiratory symptoms just like yours , many times with no obvious heartburn at all.    It can be treated with medication, but also with lifestyle changes including elevation of the head of your bed (ideally with 6 inch  bed blocks),  Smoking cessation, avoidance of late meals, excessive alcohol, and avoid fatty foods, chocolate, peppermint, colas, red wine, and acidic juices such as orange juice.  NO MINT OR MENTHOL PRODUCTS SO NO COUGH DROPS  USE SUGARLESS CANDY INSTEAD (Jolley ranchers or Stover's or Life Savers) or even ice chips will also do - the key is to swallow to prevent all throat clearing. NO OIL BASED VITAMINS - use powdered substitutes.  Work on inhaler technique:  relax and gently blow all the way out then take a nice smooth deep breath back in, triggering the inhaler at same time you start breathing in.  Hold for up to 5 seconds if you can. Blow out thru nose. Rinse and gargle with water when done  The key is to stop smoking completely before smoking completely stops you!    If you are satisfied with your treatment plan,  let your doctor know and he/she can either refill your medications or you can return here when your prescription runs out.     If in any way you are not 100% satisfied,  please tell us.  If 100% better, tell your friends!  Pulmonary follow up is as needed

## 2015-03-24 NOTE — Progress Notes (Signed)
Subjective:   Patient ID: Crystal Briggs, female    DOB: 04-16-1965    MRN: [id number]    Brief patient profile:  59 yobf active smoker no problem as child, young adult but around 2009 noted onset of sob >> dx asthma, better with inhalers but worse noct spells cough/wheeze> referred 11/03/2014 to pulmonary clinic 11/03/2014 by Dr Loney Loh   History of Present Illness  11/03/2014 1st [facility] office visit/ Crystal Briggs  Poor control of asthma symptoms x one year Chief Complaint  Patient presents with  . Pulmonary Consult    Referred by Dr. John Giovanni. Pt c/o SOB since 2009, worse for the past year. She feels SOB first thing in the am and then when she is working.  She also c/o cough, esp worse at night, prod with clear sputum.   symptoms of cough and sob present daily x one year, already on symbicort but poor hfa and yet still sob - much better p saba rx but having much more noct spells of cough/wheeze/ sob s purulent sputum rec Pantoprazole (protonix) 40 mg   Take  30-60 min before first meal of the day and Pepcid (famotidine)  20 mg one @  bedtime until return to office GERD diet Work on inhaler technique:     03/24/2015  f/u ov/Dabney Dever re:  dtc asthma / still smoking on just prn saba  Chief Complaint  Patient presents with  . Follow-up    with PFTs.  increased cough, PND, runny nose x 1 wk.  SOB unchanged.    wakes twice weekly and need proventil and each am feels needs it again  Last breathing was really good in HS and then started smoking  No obvious patterns day to day or daytime variability or assoc excess/ purulent sputum or mucus plugs  Or hemoptysis or cp or chest tightness, subjective wheeze or overt  hb symptoms. No unusual exp hx or h/o childhood pna/ asthma or knowledge of premature birth.  Sleeping ok without nocturnal  or early am exacerbation  of respiratory  c/o's or need for noct saba. Also denies any obvious fluctuation of symptoms with weather or  environmental changes or other aggravating or alleviating factors except as outlined above   Current Medications, Allergies, Complete Past Medical History, Past Surgical History, Family History, and Social History were reviewed in [employer] record.  ROS  The following are not active complaints unless bolded sore throat, dysphagia, dental problems, itching, sneezing,  nasal congestion or excess/ purulent secretions, ear ache,   fever, chills, sweats, unintended wt loss, classically pleuritic or exertional cp, hemoptysis,  orthopnea pnd or leg swelling, presyncope, palpitations, abdominal pain, anorexia, nausea, vomiting, diarrhea  or change in bowel or bladder habits, change in stools or urine, dysuria,hematuria,  rash, arthralgias, visual complaints, headache, numbness, weakness or ataxia or problems with walking or coordination,  change in mood/affect or memory.                         Objective:  Physical Exam  amb bf nad   03/24/2015        228   11/03/14 216 lb 12.8 oz (98.34 kg)  10/21/14 220 lb 14.4 oz (100.2 kg)  09/20/14 222 lb 6.4 oz (100.88 kg)    Vital signs reviewed   HEENT: nl dentition, turbinates, and orophanx. Nl external ear canals without cough reflex   NECK :  without JVD/Nodes/TM/ nl carotid upstrokes bilaterally  LUNGS: no acc muscle use, clear to A and P bilaterally without cough on insp or exp maneuvers   CV:  RRR  no s3 or murmur or increase in P2, no edema   ABD:  soft and nontender with nl excursion in the supine position. No bruits or organomegaly, bowel sounds nl  MS:  warm without deformities, calf tenderness, cyanosis or clubbing  SKIN: warm and dry without lesions    NEURO:  alert, approp, no deficits            Assessment & Plan:   Outpatient Encounter Prescriptions as of 03/24/2015  Medication Sig  . amLODipine (NORVASC) 10 MG tablet Take 1 tablet (10 mg total) by mouth daily.  .  budesonide-formoterol (SYMBICORT) 160-4.5 MCG/ACT inhaler INHALE 2 PUFFS INTO THE LUNGS 2 (TWO) TIMES DAILY.  . cetirizine (ZYRTEC) 10 MG tablet Take 1 tablet (10 mg total) by mouth daily.  . famotidine (PEPCID) 20 MG tablet TAKE 1 TABLET BY MOUTH AT BEDTIME  . fesoterodine (TOVIAZ) 4 MG TB24 tablet Take 1 tablet (4 mg total) by mouth daily.  . fluticasone (FLONASE) 50 MCG/ACT nasal spray Place 2 sprays into both nostrils daily.  [name]. guaiFENesin (MUCINEX) 600 MG 12 hr tablet Take 1 tablet (600 mg total) by mouth 2 (two) times daily.  . hydrochlorothiazide (MICROZIDE) 12.5 MG capsule TAKE ONE CAPSULE BY MOUTH EVERY DAY  . imipramine (TOFRANIL) 25 MG tablet Take 1 tablet by mouth at bedtime.  . lovastatin (MEVACOR) 20 MG tablet Take 1 tablet (20 mg total) by mouth daily before breakfast.  . meloxicam (MOBIC) 7.5 MG tablet TAKE 1 TABLET BY MOUTH EVERY DAY  . montelukast (SINGULAIR) 10 MG tablet TAKE 1 TABLET BY MOUTH EVERY DAY AT BEDTIME  . pantoprazole (PROTONIX) 40 MG tablet TAKE 1 TABLET BY MOUTH DAILY. TAKE 30-60 MINUTES BEFORE FIRST MEAL OF THE DAY  . PROVENTIL HFA 108 (90 Base) MCG/ACT inhaler INHALE 2 PUFFS INTO THE LUNGS EVERY 6 (SIX) HOURS AS NEEDED FOR WHEEZING OR SHORTNESS OF BREATH.  . tolterodine (DETROL LA) 4 MG 24 hr capsule Take 4 mg by mouth daily.  [name]. acetaminophen-codeine (TYLENOL #3) 300-30 MG per tablet Take 1 tablet by mouth at bedtime as needed (for cough). (Patient not taking: Reported on 03/24/2015)   No facility-administered encounter medications on file as of 03/24/2015.

## 2015-03-26 NOTE — Assessment & Plan Note (Signed)
Body mass index is 44.53 trending up   Lab Results  Component Value Date   TSH 2.097 04/08/2007     Contributing to gerd tendency/ doe/reviewed the need and the process to achieve and maintain neg calorie balance > defer f/u primary care including intermittently monitoring thyroid status

## 2015-03-26 NOTE — Assessment & Plan Note (Signed)
PFT's  03/24/2015  FEV1 1.26 (64 % ) ratio 69  p 8 % improvement from saba with DLCO  83/83c % corrects to 112 % for alv volume   - 03/24/2015  Walked RA x 3 laps @ 185 ft each stopped due to End of study, nl pace, no sob or desat   - 03/24/2015  extensive coaching HFA effectiveness =    75%    I had an extended final summary discussion with the patient reviewing all relevant studies completed to date and  lasting 15 to 20 minutes of a 25 minute visit on the following issues:    Technically she meets the criteria for copd but since this is "just barely" and while actively smoking with nl dlco I suspect this is more AB than COPD and would be expected to improve significantly if quits smoking now (see separate a/p)   rec trial of symbicort 160 2bid and work on stop smoking  F/u can be prn

## 2015-03-26 NOTE — Assessment & Plan Note (Signed)

## 2015-05-03 ENCOUNTER — Other Ambulatory Visit: Payer: Self-pay | Admitting: Internal Medicine

## 2015-05-03 NOTE — Telephone Encounter (Signed)
Last appt 01/03/15.  No f/u appt.

## 2015-05-11 ENCOUNTER — Other Ambulatory Visit: Payer: Self-pay | Admitting: Internal Medicine

## 2015-05-17 ENCOUNTER — Telehealth: Payer: Self-pay | Admitting: *Deleted

## 2015-05-17 NOTE — Telephone Encounter (Signed)
Reviewed visits and meds with pt

## 2015-05-22 ENCOUNTER — Other Ambulatory Visit: Payer: Self-pay | Admitting: Internal Medicine

## 2015-06-15 ENCOUNTER — Other Ambulatory Visit: Payer: Self-pay | Admitting: Internal Medicine

## 2015-07-02 ENCOUNTER — Other Ambulatory Visit: Payer: Self-pay | Admitting: Internal Medicine

## 2015-07-04 NOTE — Telephone Encounter (Signed)
Last visit 01/03/2015

## 2015-07-21 ENCOUNTER — Other Ambulatory Visit: Payer: Self-pay | Admitting: Internal Medicine

## 2015-07-31 ENCOUNTER — Other Ambulatory Visit: Payer: Self-pay | Admitting: Internal Medicine

## 2015-08-07 ENCOUNTER — Other Ambulatory Visit: Payer: Self-pay | Admitting: Internal Medicine

## 2015-08-10 ENCOUNTER — Other Ambulatory Visit: Payer: Self-pay | Admitting: Internal Medicine

## 2015-08-10 ENCOUNTER — Telehealth: Payer: Self-pay | Admitting: Internal Medicine

## 2015-08-10 NOTE — Telephone Encounter (Signed)
This rx request needs to go to pt's PCP.  Pt advised. Nothing further needed.

## 2015-08-11 ENCOUNTER — Telehealth: Payer: Self-pay

## 2015-08-11 NOTE — Telephone Encounter (Signed)
Last seen 01/03/2015

## 2015-08-11 NOTE — Telephone Encounter (Signed)
Caryn Bee from [facility] pharmacy requesting Meloxicam to be filled.

## 2015-08-13 MED ORDER — MELOXICAM 7.5 MG PO TABS: 7.5000 mg | ORAL_TABLET | Freq: Every day | ORAL | 0 refills | Status: DC

## 2015-08-14 NOTE — Telephone Encounter (Signed)
Please schedule patient for appointment so that we can continue to fill meds.

## 2015-08-15 NOTE — Telephone Encounter (Signed)
Thanks

## 2015-08-15 NOTE — Telephone Encounter (Signed)
Patient was made an appt for 09-12-15 @ 1:15 pm with Dr. Loney Loh.  That appt made on 08/03/15 and an appt letter was mailed to the patient.  Also called the patient this am and left message about the upcoming appt on 09-12-15.

## 2015-08-16 ENCOUNTER — Other Ambulatory Visit: Payer: Self-pay | Admitting: Internal Medicine

## 2015-08-16 DIAGNOSIS — Z1231 Encounter for screening mammogram for malignant neoplasm of breast: Secondary | ICD-10-CM

## 2015-08-24 NOTE — Telephone Encounter (Signed)
review 

## 2015-08-25 ENCOUNTER — Ambulatory Visit: Payer: Medicaid Other

## 2015-08-27 ENCOUNTER — Other Ambulatory Visit: Payer: Self-pay | Admitting: Internal Medicine

## 2015-08-29 ENCOUNTER — Telehealth: Payer: Self-pay

## 2015-08-29 NOTE — Telephone Encounter (Signed)
Needs to speak with a nurse regarding meloxicam.

## 2015-08-29 NOTE — Telephone Encounter (Signed)
Called pt back, she states she is angry because she didn't get a refill on her meloxicam, she stated she had come to all her appts and its not her fault that imc cant see her when they are supposed to, I ask if she had called for an appt and not been able to get one, she said no but no one called her to offer her one, I reviewed her last visit with her and that in her disch instructions it stated she was to f/u in 1 month, reviewed her next scheduled appt 8/29 and ask why she was taking the meloxicam, she answered "for my stomach or something like that" I explained that it was a muscle relaxant, at this point the phone was disconnected

## 2015-08-31 NOTE — Telephone Encounter (Signed)
Thanks for the update. My advise would be for her to come to her appt on 8/29.

## 2015-09-01 ENCOUNTER — Ambulatory Visit
Admission: RE | Admit: 2015-09-01 | Discharge: 2015-09-01 | Disposition: A | Discharge status: Home / Self Care | Payer: Medicaid Other | Source: Ambulatory Visit | Attending: Internal Medicine | Admitting: Internal Medicine

## 2015-09-01 DIAGNOSIS — Z1231 Encounter for screening mammogram for malignant neoplasm of breast: Secondary | ICD-10-CM

## 2015-09-11 ENCOUNTER — Telehealth: Payer: Self-pay | Admitting: Internal Medicine

## 2015-09-11 NOTE — Telephone Encounter (Signed)
APT. REMINDER CALL, LMTCB °

## 2015-09-12 ENCOUNTER — Ambulatory Visit (INDEPENDENT_AMBULATORY_CARE_PROVIDER_SITE_OTHER): Payer: Medicaid Other | Admitting: Internal Medicine

## 2015-09-12 ENCOUNTER — Encounter: Payer: Self-pay | Admitting: Internal Medicine

## 2015-09-12 VITALS — BP 143/78 | HR 101 | Temp 98.2°F | Ht 61.0 in | Wt 250.3 lb

## 2015-09-12 DIAGNOSIS — Z23 Encounter for immunization: Secondary | ICD-10-CM | POA: Diagnosis not present

## 2015-09-12 DIAGNOSIS — M545 Low back pain, unspecified: Secondary | ICD-10-CM

## 2015-09-12 DIAGNOSIS — Z6841 Body Mass Index (BMI) 40.0 and over, adult: Secondary | ICD-10-CM

## 2015-09-12 DIAGNOSIS — K219 Gastro-esophageal reflux disease without esophagitis: Secondary | ICD-10-CM | POA: Diagnosis not present

## 2015-09-12 DIAGNOSIS — Z79899 Other long term (current) drug therapy: Secondary | ICD-10-CM | POA: Diagnosis not present

## 2015-09-12 DIAGNOSIS — G8929 Other chronic pain: Secondary | ICD-10-CM

## 2015-09-12 DIAGNOSIS — Z0000 Encounter for general adult medical examination without abnormal findings: Secondary | ICD-10-CM

## 2015-09-12 DIAGNOSIS — F17210 Nicotine dependence, cigarettes, uncomplicated: Secondary | ICD-10-CM | POA: Diagnosis not present

## 2015-09-12 DIAGNOSIS — I10 Essential (primary) hypertension: Secondary | ICD-10-CM

## 2015-09-12 DIAGNOSIS — N393 Stress incontinence (female) (male): Secondary | ICD-10-CM

## 2015-09-12 DIAGNOSIS — Z889 Allergy status to unspecified drugs, medicaments and biological substances status: Secondary | ICD-10-CM

## 2015-09-12 DIAGNOSIS — E785 Hyperlipidemia, unspecified: Secondary | ICD-10-CM | POA: Diagnosis not present

## 2015-09-12 DIAGNOSIS — J4530 Mild persistent asthma, uncomplicated: Secondary | ICD-10-CM

## 2015-09-12 DIAGNOSIS — J45909 Unspecified asthma, uncomplicated: Secondary | ICD-10-CM | POA: Diagnosis not present

## 2015-09-12 MED ORDER — PANTOPRAZOLE SODIUM 40 MG PO TBEC: 40.0000 mg | DELAYED_RELEASE_TABLET | Freq: Two times a day (BID) | ORAL | 0 refills | Status: DC

## 2015-09-12 MED ORDER — MELOXICAM 7.5 MG PO TABS: 7.5000 mg | ORAL_TABLET | Freq: Every day | ORAL | 1 refills | Status: DC

## 2015-09-12 NOTE — Assessment & Plan Note (Signed)
BP Readings from Last 3 Encounters:  09/12/15 (!) 143/78  03/24/15 (!) 150/84  01/03/15 130/70    Lab Results  Component Value Date   NA 138 08/18/2014   K 4.4 08/18/2014   CREATININE 0.59 08/18/2014    Assessment: Blood pressure control:  above goal (<140/90) Progress toward BP goal:   improved Comments: Blood pressure slightly elevated and patient mildly tachycardic today. BP has improved since previous visit. Patient states she has been taking her medications regularly and attributes this mild elevation in her blood pressure to her trying to rush to her appointment today. Currently taking amlodipine 10 mg daily and hydrochlorothiazide 12.5 mg daily.  Plan: Medications:  continue current medications Educational resources provided:   Educated patient about healthy eating and exercise. Emphasized the importance of weight loss.  Other plans:  -Recheck at next visit in one month

## 2015-09-12 NOTE — Assessment & Plan Note (Signed)
Assessment Patient is complaining of GERD symptoms despite taking Protonix 40 mg daily and Pepcid 20 mg daily.  Plan -One-month trial of Protonix 40 mg twice daily -Continue Pepcid 20 mg daily -Emphasized the importance of weight loss -Return to clinic in 1 month -If patient continues to complain of GERD symptoms at next visit, consider starting trazodone for non-ulcer dyspepsia.

## 2015-09-12 NOTE — Assessment & Plan Note (Addendum)
Assessment X-ray of lumbar spine done August 2016 showing multilevel degenerative disc disease and lumbar spondylosis. Patient does not have any complaints at present. Reports running out of her Mobic prescription 2 weeks ago and is currently taking over-the-counter ibuprofen as needed.  Plan -BMET to check renal function -Advised patient to take Mobic (7.5 mg daily) only and stop taking over-the-counter ibuprofen.  Addendum (09/13/15) Scr 0.7, close to baseline (0.6). Spoke to the patient over the phone and discussed the results with her.

## 2015-09-12 NOTE — Assessment & Plan Note (Signed)
Assessment Stable. Patient has no complaints. Currently taking cetirizine 10 mg daily and using Flonase 2 sprays into both nostrils daily.  Plan -Continue current management

## 2015-09-12 NOTE — Assessment & Plan Note (Signed)
Assessment Patient has gained 22 pounds since her last visit 5 months ago. She is euvolemic on exam (no lower extremity edema and lungs clear). Patient does admit that she needs to be more physically active and watch her diet.  Plan -Educated patient about healthy eating and exercise. Emphasized the importance of weight loss.  -Consider checking TSH if weight continues to go up at next visit

## 2015-09-12 NOTE — Assessment & Plan Note (Signed)
Assessment Patient states she has cut down her smoking to 6 cigarettes per day. I spent a dedicated 4 minutes discussing smoking cessation with the patient. She seems to be in the pre-contemplative stage at this time.  Plan -Discuss again at next visit

## 2015-09-12 NOTE — Assessment & Plan Note (Signed)
Assessment Patient was previously given a referral to gastroenterology for screening colonoscopy. States "the doctor won't take me"and she is not sure why.   Plan -Our office will try to find out why patient wasn't accepted by GI. -Influenza vaccine administered at this visit

## 2015-09-12 NOTE — Progress Notes (Signed)
   CC: Patient is here to discuss her chronic medical conditions and get refills on medications.  HPI:  Ms.Cj Judie PetitM Rayburn MaBlackmon is a 50 y.o. female with a past medical history of conditions listed below presenting to the clinic to discuss her chronic medical conditions including hypertension, asthma, stress incontinence, chronic lower back pain, tobacco use, hyperlipidemia, seasonal allergies, GERD, and severe obesity. Please see problem based charting for the status of the patient's chronic medical conditions.  Past Medical History:  Diagnosis Date  . Asthma, chronic 03/30/2012  . Essential hypertension, benign 03/30/2012  . Hypertension   . Seasonal allergies 03/30/2012  . Stress incontinence, female 03/30/2012  . Tobacco abuse 03/30/2012    Review of Systems:  Pertinent positives mentioned in history of present illness. Remainder of all review of systems negative.  Physical Exam:  Vitals:   09/12/15 1325  BP: (!) 143/78  Pulse: (!) 101  Temp: 98.2 F (36.8 C)  TempSrc: Oral  SpO2: 100%  Weight: 250 lb 4.8 oz (113.5 kg)  Height: 5\' 1"  (1.549 m)   Physical Exam  Constitutional: She is oriented to person, place, and time. She appears well-developed and well-nourished. No distress.  HENT:  Head: Normocephalic and atraumatic.  Mouth/Throat: Oropharynx is clear and moist.  Eyes: EOM are normal. Pupils are equal, round, and reactive to light.  Neck: Neck supple. No tracheal deviation present.  Cardiovascular: Regular rhythm and intact distal pulses.  Exam reveals no gallop and no friction rub.   No murmur heard. Mildly tachycardic   Pulmonary/Chest: Effort normal and breath sounds normal. No respiratory distress. She has no wheezes. She has no rales.  Abdominal: Soft. Bowel sounds are normal. There is no tenderness. There is no guarding.  Obese  Musculoskeletal: Normal range of motion. She exhibits no edema.  Neurological: She is alert and oriented to person, place, and time.    Skin: Skin is warm and dry.    Assessment & Plan:   See Encounters Tab for problem based charting.  Patient discussed with Dr. Josem KaufmannKlima

## 2015-09-12 NOTE — Assessment & Plan Note (Signed)
Assessment Stable. Patient is not complaining of any wheezing or shortness of breath. Lungs clear on exam. She is currently on Symbicort 160-4.5 mg 2 puffs twice daily and Proventil 2 puffs every 6 hours as needed. Patient was last seen by Dr. Sherene SiresWert (pulmonology) in March 2017.  Plan -Continue current management

## 2015-09-12 NOTE — Patient Instructions (Signed)
Ms. Rayburn MaBlackmon, it was nice to see you today.   -Please take Protonix 40 mg 1 tablet by mouth twice daily as instructed   -Continue taking Pepcid as instructed   -STOP taking over-the-counter Ibuprofen since you are taking Mobic for back pain.  -Return to the clinic in 1 month.

## 2015-09-12 NOTE — Progress Notes (Signed)
Case discussed with Dr. Rathore at the time of the visit.  We reviewed the resident's history and exam and pertinent patient test results.  I agree with the assessment, diagnosis, and plan of care documented in the resident's note. 

## 2015-09-12 NOTE — Assessment & Plan Note (Signed)
Assessment Patient is currently taking tolterodine 4 mg daily and imipramine 50 mg daily at night. She does not have any complaints at present and states her symptoms are well-controlled with these medications.  Plan -Continue current management

## 2015-09-12 NOTE — Assessment & Plan Note (Addendum)
Assessment Currently on lovastatin 20 mg daily (low intensity statin).  Plan -Lipid panel at this visit to assess compliance/ need to escalate patient to a higher intensity statin   Addendum (09/13/15) Lipid panel showing chol 207, trig 139, HDL 53, and LDL 126. Patient's 10 year ASCVD risk score is >7.5%. Spoke to the patient over the phone and discussed the results with her.  -D/c Lovastatin -Start high intensity statin (Lipitor 40 mg daily) -Start Aspirin 81 mg daily for primary prevention

## 2015-09-13 LAB — LIPID PANEL
Chol/HDL Ratio: 3.9 ratio units (ref 0.0–4.4)
Cholesterol, Total: 207 mg/dL — ABNORMAL HIGH (ref 100–199)
HDL: 53 mg/dL (ref >39)
LDL Calculated: 126 mg/dL — ABNORMAL HIGH (ref 0–99)
Triglycerides: 139 mg/dL (ref 0–149)
VLDL Cholesterol Cal: 28 mg/dL (ref 5–40)

## 2015-09-13 LAB — BMP8+ANION GAP
Anion Gap: 15 mmol/L (ref 10.0–18.0)
BUN / CREAT RATIO: 13 (ref 9–23)
BUN: 9 mg/dL (ref 6–24)
CALCIUM: 9.4 mg/dL (ref 8.7–10.2)
CO2: 29 mmol/L (ref 18–29)
Chloride: 96 mmol/L (ref 96–106)
Creatinine, Ser: 0.7 mg/dL (ref 0.57–1.00)
GFR calc non Af Amer: 101 mL/min/{1.73_m2} (ref >59)
GFR, EST AFRICAN AMERICAN: 116 mL/min/{1.73_m2} (ref >59)
GLUCOSE: 83 mg/dL (ref 65–99)
POTASSIUM: 4.5 mmol/L (ref 3.5–5.2)
Sodium: 140 mmol/L (ref 134–144)

## 2015-09-13 MED ORDER — ASPIRIN EC 81 MG PO TBEC: 81.0000 mg | DELAYED_RELEASE_TABLET | Freq: Every day | ORAL | 3 refills | Status: DC

## 2015-09-13 MED ORDER — ATORVASTATIN CALCIUM 40 MG PO TABS: 40.0000 mg | ORAL_TABLET | Freq: Every day | ORAL | 3 refills | Status: DC

## 2015-09-13 NOTE — Addendum Note (Signed)
Addended by: John GiovanniATHORE, Londan Coplen on: 09/13/2015 03:30 PM   Modules accepted: Orders

## 2015-09-15 ENCOUNTER — Other Ambulatory Visit: Payer: Self-pay | Admitting: Internal Medicine

## 2015-09-19 ENCOUNTER — Other Ambulatory Visit: Payer: Self-pay | Admitting: *Deleted

## 2015-09-19 MED ORDER — MONTELUKAST SODIUM 10 MG PO TABS: 10.0000 mg | ORAL_TABLET | Freq: Every day | ORAL | 1 refills | Status: DC

## 2015-10-02 ENCOUNTER — Telehealth: Payer: Self-pay | Admitting: Internal Medicine

## 2015-10-02 NOTE — Telephone Encounter (Signed)
APT. REMINDER CALL, LMTCB °

## 2015-10-03 ENCOUNTER — Ambulatory Visit (INDEPENDENT_AMBULATORY_CARE_PROVIDER_SITE_OTHER): Payer: Medicaid Other | Admitting: Internal Medicine

## 2015-10-03 ENCOUNTER — Encounter: Payer: Self-pay | Admitting: Internal Medicine

## 2015-10-03 VITALS — BP 135/83 | HR 105 | Temp 98.2°F | Ht 61.0 in | Wt 253.7 lb

## 2015-10-03 DIAGNOSIS — I10 Essential (primary) hypertension: Secondary | ICD-10-CM

## 2015-10-03 DIAGNOSIS — F17210 Nicotine dependence, cigarettes, uncomplicated: Secondary | ICD-10-CM

## 2015-10-03 DIAGNOSIS — Z6841 Body Mass Index (BMI) 40.0 and over, adult: Secondary | ICD-10-CM

## 2015-10-03 DIAGNOSIS — K219 Gastro-esophageal reflux disease without esophagitis: Secondary | ICD-10-CM | POA: Diagnosis not present

## 2015-10-03 DIAGNOSIS — Z0000 Encounter for general adult medical examination without abnormal findings: Secondary | ICD-10-CM

## 2015-10-03 DIAGNOSIS — G8929 Other chronic pain: Secondary | ICD-10-CM

## 2015-10-03 DIAGNOSIS — Z79899 Other long term (current) drug therapy: Secondary | ICD-10-CM | POA: Diagnosis not present

## 2015-10-03 DIAGNOSIS — M549 Dorsalgia, unspecified: Secondary | ICD-10-CM | POA: Diagnosis not present

## 2015-10-03 MED ORDER — VARENICLINE TARTRATE 0.5 MG X 11 & 1 MG X 42 PO MISC: ORAL | 0 refills | Status: DC

## 2015-10-03 MED ORDER — DICLOFENAC SODIUM 1 % TD GEL: 2.0000 g | Freq: Four times a day (QID) | TRANSDERMAL | 1 refills | Status: DC

## 2015-10-03 NOTE — Patient Instructions (Signed)
Varenicline oral tablets What is this medicine? VARENICLINE (var EN i kleen) is used to help people quit smoking. It can reduce the symptoms caused by stopping smoking. It is used with a patient support program recommended by your physician. This medicine may be used for other purposes; ask your health care provider or pharmacist if you have questions. What should I tell my health care provider before I take this medicine? They need to know if you have any of these conditions: -bipolar disorder, depression, schizophrenia or other mental illness -heart disease -if you often drink alcohol -kidney disease -peripheral vascular disease -seizures -stroke -suicidal thoughts, plans, or attempt; a previous suicide attempt by you or a family member -an unusual or allergic reaction to varenicline, other medicines, foods, dyes, or preservatives -pregnant or trying to get pregnant -breast-feeding How should I use this medicine? Take this medicine by mouth after eating. Take with a full glass of water. Follow the directions on the prescription label. Take your doses at regular intervals. Do not take your medicine more often than directed. There are 3 ways you can use this medicine to help you quit smoking; talk to your health care professional to decide which plan is right for you: 1) you can choose a quit date and start this medicine 1 week before the quit date, or, 2) you can start taking this medicine before you choose a quit date, and then pick a quit date between day 8 and 35 days of treatment, or, 3) if you are not sure that you are able or willing to quit smoking right away, start taking this medicine and slowly decrease the amount you smoke as directed by your health care professional with the goal of being cigarette-free by week 12 of treatment. Stick to your plan; ask about support groups or other ways to help you remain cigarette-free. If you are motivated to quit smoking and did not succeed  during a previous attempt with this medicine for reasons other than side effects, or if you returned to smoking after this treatment, speak with your health care professional about whether another course of this medicine may be right for you. A special MedGuide will be given to you by the pharmacist with each prescription and refill. Be sure to read this information carefully each time. Talk to your pediatrician regarding the use of this medicine in children. This medicine is not approved for use in children. Overdosage: If you think you have taken too much of this medicine contact a poison control center or emergency room at once. NOTE: This medicine is only for you. Do not share this medicine with others. What if I miss a dose? If you miss a dose, take it as soon as you can. If it is almost time for your next dose, take only that dose. Do not take double or extra doses. What may interact with this medicine? -alcohol or any product that contains alcohol -insulin -other stop smoking aids -theophylline -warfarin This list may not describe all possible interactions. Give your health care provider a list of all the medicines, herbs, non-prescription drugs, or dietary supplements you use. Also tell them if you smoke, drink alcohol, or use illegal drugs. Some items may interact with your medicine. What should I watch for while using this medicine? Visit your doctor or health care professional for regular check ups. Ask for ongoing advice and encouragement from your doctor or healthcare professional, friends, and family to help you quit. If you smoke while on   this medication, quit again Your mouth may get dry. Chewing sugarless gum or sucking hard candy, and drinking plenty of water may help. Contact your doctor if the problem does not go away or is severe. You may get drowsy or dizzy. Do not drive, use machinery, or do anything that needs mental alertness until you know how this medicine affects you. Do  not stand or sit up quickly, especially if you are an older patient. This reduces the risk of dizzy or fainting spells. Sleepwalking can happen during treatment with this medicine, and can sometimes lead to behavior that is harmful to you, other people, or property. Stop taking this medicine and tell your doctor if you start sleepwalking or have other unusual sleep-related activity. Decrease the amount of alcoholic beverages that you drink during treatment with this medicine until you know if this medicine affects your ability to tolerate alcohol. Some people have experienced increased drunkenness (intoxication), unusual or sometimes aggressive behavior, or no memory of things that have happened (amnesia) during treatment with this medicine. The use of this medicine may increase the chance of suicidal thoughts or actions. Pay special attention to how you are responding while on this medicine. Any worsening of mood, or thoughts of suicide or dying should be reported to your health care professional right away. What side effects may I notice from receiving this medicine? Side effects that you should report to your doctor or health care professional as soon as possible: -allergic reactions like skin rash, itching or hives, swelling of the face, lips, tongue, or throat -acting aggressive, being angry or violent, or acting on dangerous impulses -breathing problems -changes in vision -chest pain or chest tightness -confusion, trouble speaking or understanding -new or worsening depression, anxiety, or panic attacks -extreme increase in activity and talking (mania) -fast, irregular heartbeat -feeling faint or lightheaded, falls -fever -pain in legs when walking -problems with balance, talking, walking -redness, blistering, peeling or loosening of the skin, including inside the mouth -ringing in ears -seeing or hearing things that aren't there (hallucinations) -seizures -sleepwalking -sudden numbness  or weakness of the face, arm or leg -thoughts about suicide or dying, or attempts to commit suicide -trouble passing urine or change in the amount of urine -unusual bleeding or bruising -unusually weak or tired Side effects that usually do not require medical attention (report to your doctor or health care professional if they continue or are bothersome): -constipation -headache -nausea, vomiting -strange dreams -stomach gas -trouble sleeping This list may not describe all possible side effects. Call your doctor for medical advice about side effects. You may report side effects to FDA at 1-800-FDA-1088. Where should I keep my medicine? Keep out of the reach of children. Store at room temperature between 15 and 30 degrees C (59 and 86 degrees F). Throw away any unused medicine after the expiration date. NOTE: This sheet is a summary. It may not cover all possible information. If you have questions about this medicine, talk to your doctor, pharmacist, or health care provider.    2016, Elsevier/Gold Standard. (2014-09-15 16:14:23)  

## 2015-10-04 ENCOUNTER — Telehealth: Payer: Self-pay | Admitting: *Deleted

## 2015-10-04 LAB — HIV ANTIBODY (ROUTINE TESTING W REFLEX): HIV Screen 4th Generation wRfx: NONREACTIVE

## 2015-10-04 NOTE — Telephone Encounter (Signed)
Called patient back and discussed lab result with her.

## 2015-10-04 NOTE — Progress Notes (Signed)
   CC: Patient is here to discuss her chronic medical conditions including hypertension, GERD, and tobacco use.  HPI:  [name] is a 50 y.o. female with a past medical history of conditions listed below presenting to the clinic to discuss her chronic medical conditions including hypertension, GERD, and tobacco use. Please see problem based charting for the status of the patient's chronic medical conditions.  Past Medical History:  Diagnosis Date  . Asthma, chronic 03/30/2012  . Essential hypertension, benign 03/30/2012  . Hypertension   . Seasonal allergies 03/30/2012  . Stress incontinence, female 03/30/2012  . Tobacco abuse 03/30/2012    Review of Systems:  Pertinent positives mentioned in history of present illness. Remainder of all review of systems negative.   Physical Exam:  Vitals:   10/03/15 1439  BP: 135/83  Pulse: (!) 105  Temp: 98.2 F (36.8 C)  TempSrc: Oral  SpO2: 100%  Weight: 253 lb 11.2 oz (115.1 kg)  Height: 5\' 1"  (1.549 m)   Physical Exam  Constitutional: She is oriented to person, place, and time. She appears well-developed and well-nourished. No distress.  Morbidly obese  HENT:  Head: Normocephalic and atraumatic.  Eyes: EOM are normal.  Neck: Neck supple. No tracheal deviation present.  Cardiovascular: Normal rate, regular rhythm and intact distal pulses.   Pulmonary/Chest: Effort normal and breath sounds normal. No respiratory distress.  Abdominal: Soft. Bowel sounds are normal. She exhibits no distension. There is no tenderness. There is no guarding.  Musculoskeletal: Normal range of motion. She exhibits no edema or deformity.  Neurological: She is alert and oriented to person, place, and time.  Skin: Skin is warm and dry.    Assessment & Plan:   See Encounters Tab for problem based charting.  Patient discussed with Dr. Heide SparkNarendra

## 2015-10-04 NOTE — Assessment & Plan Note (Signed)
Assessment Patient states her GERD symptoms are better since she started taking Protonix 40 mg twice daily and Pepcid 20 mg once daily. However, symptoms have not resolved completely. Patient does have a history of chronic back pain and is currently taking meloxicam on a daily basis. As such, there is concern for NSAID-induced gastritis/ peptic ulcer disease.  Plan -Continue Protonix and Pepcid for now -Discontinue meloxicam -Start Voltaren gel for back pain -Checking for H. pylori stool antigen. If negative, consider referring patient to GI for an EGD. -Reassess symptoms at next visit

## 2015-10-04 NOTE — Assessment & Plan Note (Addendum)
BP Readings from Last 3 Encounters:  10/03/15 135/83  09/12/15 (!) 143/78  03/24/15 (!) 150/84    Lab Results  Component Value Date   NA 140 09/12/2015   K 4.5 09/12/2015   CREATININE 0.70 09/12/2015    Assessment: Blood pressure control:  below goal (less than 140/90) Progress toward BP goal:   improved Comments: Patient is currently taking amlodipine 10 mg daily and hydrochlorothiazide 12.5 mg daily.  Plan: Medications:  continue current medications Educational resources provided:   Educated patient about healthy eating and exercise. Emphasized the importance of weight loss.

## 2015-10-04 NOTE — Telephone Encounter (Signed)
States someone called her, did not leave a message - unsure if it was Dr Loney Lohathore. I did send Dr Loney Lohathore a text message to call pt @ [phone].

## 2015-10-04 NOTE — Assessment & Plan Note (Signed)
Assessment Patient is currently smoking 6 cigarettes per day and is interested in quitting. States she has tried nicotine patches in the past and they did not work. She is interested in trying an oral medication instead.   Plan -Chantix starter pack -Return to clinic in 1 month

## 2015-10-04 NOTE — Assessment & Plan Note (Signed)
HIV screen negative. Results have been discussed with the patient over the phone on 10/04/2015.

## 2015-10-04 NOTE — Assessment & Plan Note (Addendum)
Assessment Patient continues to gain weight; has gained 3 pounds since last visit 3 weeks ago.  Plan -Counseled patient on healthy eating and exercise. Emphasized the importance of weight loss.  -Checking TSH to rule out hypothyroidism  Addendum 10/05/15: TSH normal. Result discussed with the patient over the phone.

## 2015-10-05 NOTE — Progress Notes (Signed)
I was later able to reach her over the phone yesterday. Patient is aware of the result.

## 2015-10-05 NOTE — Progress Notes (Signed)
Internal Medicine Clinic Attending  Case discussed with Dr. Rathoreat the time of the visit. We reviewed the resident's history and exam and pertinent patient test results. I agree with the assessment, diagnosis, and plan of care documented in the resident's note.  

## 2015-10-06 LAB — TSH: TSH: 2.48 u[IU]/mL (ref 0.450–4.500)

## 2015-10-13 ENCOUNTER — Other Ambulatory Visit: Payer: Self-pay | Admitting: Internal Medicine

## 2015-10-23 ENCOUNTER — Other Ambulatory Visit: Payer: Self-pay | Admitting: Internal Medicine

## 2015-10-27 ENCOUNTER — Other Ambulatory Visit: Payer: Self-pay | Admitting: Internal Medicine

## 2015-10-28 ENCOUNTER — Other Ambulatory Visit: Payer: Self-pay | Admitting: Internal Medicine

## 2015-10-31 ENCOUNTER — Other Ambulatory Visit: Payer: Self-pay | Admitting: Internal Medicine

## 2015-10-31 MED ORDER — VARENICLINE TARTRATE 1 MG PO TABS: 1.0000 mg | ORAL_TABLET | Freq: Two times a day (BID) | ORAL | 0 refills | Status: DC

## 2015-10-31 NOTE — Telephone Encounter (Signed)
Pt states inhaler is not a the pharmacy. Please call pt back.

## 2015-10-31 NOTE — Telephone Encounter (Signed)
Called pharm they got script today

## 2015-11-01 NOTE — Telephone Encounter (Signed)
Pt needs symbicort to be filled. Please call pt back.

## 2015-11-06 ENCOUNTER — Telehealth: Payer: Self-pay | Admitting: Internal Medicine

## 2015-11-06 NOTE — Telephone Encounter (Signed)
APT. REMINDER CALL, LMTCB °

## 2015-11-07 ENCOUNTER — Encounter: Payer: Medicaid Other | Admitting: Internal Medicine

## 2015-11-08 ENCOUNTER — Telehealth: Payer: Self-pay | Admitting: Internal Medicine

## 2015-11-08 NOTE — Telephone Encounter (Signed)
APT. REMINDER CALL, LMTCB °

## 2015-11-09 ENCOUNTER — Ambulatory Visit (INDEPENDENT_AMBULATORY_CARE_PROVIDER_SITE_OTHER): Payer: Medicaid Other | Admitting: Internal Medicine

## 2015-11-09 VITALS — BP 135/75 | HR 97 | Temp 97.8°F | Ht 61.0 in | Wt 259.2 lb

## 2015-11-09 DIAGNOSIS — F17210 Nicotine dependence, cigarettes, uncomplicated: Secondary | ICD-10-CM

## 2015-11-09 DIAGNOSIS — Z6841 Body Mass Index (BMI) 40.0 and over, adult: Secondary | ICD-10-CM | POA: Diagnosis not present

## 2015-11-09 DIAGNOSIS — K219 Gastro-esophageal reflux disease without esophagitis: Secondary | ICD-10-CM

## 2015-11-09 DIAGNOSIS — Z716 Tobacco abuse counseling: Secondary | ICD-10-CM

## 2015-11-09 MED ORDER — VARENICLINE TARTRATE 1 MG PO TABS: 1.0000 mg | ORAL_TABLET | Freq: Two times a day (BID) | ORAL | 0 refills | Status: DC

## 2015-11-09 NOTE — Patient Instructions (Addendum)
[name] it was nice seeing you today.  -Use Chantix continuing month pack for another 2 months  -Please take the stool kit home with you and return it to the lab at your earliest convenience.   -I encourage you to eat healthy and exercise 30 minutes a day, 5 days a week.   -Return for a follow-up visit in 2 months.

## 2015-11-11 NOTE — Assessment & Plan Note (Signed)
Assessment Patient was started on Chantix in September. She is currently taking the medication every other day. States she was smoking 6 cigarettes per day prior to starting this medication and is now down to 3 cigarettes per day.  Plan -Chantix continuing month pack for another 8 weeks. Goal is to have complete smoking cessation by the end of 12 weeks -Advised patient to take medication daily as prescribed

## 2015-11-11 NOTE — Assessment & Plan Note (Signed)
Assessment During previous visit, H pylori testing was ordered, however, patient did not pick up her stool kit from the clinic. States she continues to experience GERD symptoms despite using Protonix 40 mg twice daily and Pepcid 20 mg daily.  Plan -H. pylori stool antigen pending. If negative, next step is to refer patient to GI for an EGD.

## 2015-11-11 NOTE — Assessment & Plan Note (Signed)
Assessment Patient reports dietary indiscretions and is not exercising. She continues to gain weight; has gained 6 pounds since last visit 7 weeks ago.  Plan -Educated patient about healthy eating and exercise. Emphasized the importance of weight loss.

## 2015-11-11 NOTE — Progress Notes (Signed)
   CC: Patient is here for follow-up of smoking cessation.  HPI:  [name] is a 50 y.o. female with a past medical history of conditions listed below presenting to the clinic for a follow-up of smoking cessation. GERD and obesity were also discussed during this visit. Please see problem based charting for the status of the patient's current and chronic medical conditions.   Past Medical History:  Diagnosis Date  . Asthma, chronic 03/30/2012  . Essential hypertension, benign 03/30/2012  . Hypertension   . Seasonal allergies 03/30/2012  . Stress incontinence, female 03/30/2012  . Tobacco abuse 03/30/2012    Review of Systems:   Review of Systems  Constitutional: Negative for chills, fever and weight loss.  Respiratory: Negative for cough and shortness of breath.   Cardiovascular: Negative for chest pain and leg swelling.  Gastrointestinal: Positive for heartburn. Negative for abdominal pain.  Skin: Negative for itching and rash.   Physical Exam:  Vitals:   11/09/15 0953  BP: 135/75  Pulse: 97  Temp: 97.8 F (36.6 C)  TempSrc: Oral  SpO2: 100%  Weight: 117.6 kg (259 lb 3.2 oz)  Height: 5\' 1"  (1.549 m)   Physical Exam  Constitutional: She is oriented to person, place, and time. She appears well-developed and well-nourished.  Morbidly obese  HENT:  Head: Normocephalic and atraumatic.  Mouth/Throat: Oropharynx is clear and moist.  Eyes: EOM are normal.  Neck: Neck supple. No tracheal deviation present.  Cardiovascular: Normal rate, regular rhythm and intact distal pulses.   Pulmonary/Chest: Effort normal and breath sounds normal. No respiratory distress.  Abdominal: Soft. Bowel sounds are normal. She exhibits no distension. There is no tenderness.  Musculoskeletal: Normal range of motion. She exhibits no edema.  Neurological: She is alert and oriented to person, place, and time.  Skin: Skin is warm and dry.    Assessment & Plan:   See Encounters Tab for  problem based charting.  Patient discussed with Dr. Cyndie ChimeGranfortuna

## 2015-11-13 NOTE — Progress Notes (Signed)
Medicine attending: Medical history, presenting problems, physical findings, and medications, reviewed with resident physician Dr Vasu Rathore on the day of the patient visit and I concur with her evaluation and management plan. 

## 2015-11-18 ENCOUNTER — Other Ambulatory Visit: Payer: Self-pay | Admitting: Internal Medicine

## 2015-12-01 ENCOUNTER — Other Ambulatory Visit: Payer: Self-pay | Admitting: Internal Medicine

## 2015-12-01 ENCOUNTER — Other Ambulatory Visit: Payer: Self-pay

## 2015-12-01 MED ORDER — FAMOTIDINE 20 MG PO TABS: 20.0000 mg | ORAL_TABLET | Freq: Every day | ORAL | 0 refills | Status: DC

## 2015-12-01 NOTE — Telephone Encounter (Signed)
famotidine (PEPCID) 20 MG tablet, refill request @ [facility] [address]

## 2015-12-04 ENCOUNTER — Other Ambulatory Visit: Payer: Self-pay

## 2015-12-04 NOTE — Telephone Encounter (Signed)
Please call pt back regarding meds.  

## 2015-12-04 NOTE — Telephone Encounter (Signed)
tolterodine (DETROL LA) 4 MG 24 hr capsule, [facility] request @ [facility] [address]

## 2015-12-05 MED ORDER — TOLTERODINE TARTRATE ER 4 MG PO CP24: 4.0000 mg | ORAL_CAPSULE | Freq: Every day | ORAL | 1 refills | Status: DC

## 2015-12-17 ENCOUNTER — Other Ambulatory Visit: Payer: Self-pay | Admitting: Internal Medicine

## 2015-12-19 NOTE — Addendum Note (Signed)
Addended by: Remus BlakeBARROW, Bryne Lindon K on: 12/19/2015 03:49 PM   Modules accepted: Orders

## 2016-01-01 ENCOUNTER — Other Ambulatory Visit: Payer: Self-pay | Admitting: Internal Medicine

## 2016-01-01 ENCOUNTER — Telehealth: Payer: Self-pay | Admitting: Internal Medicine

## 2016-01-01 NOTE — Telephone Encounter (Signed)
APT. REMINDER CALL, LMTCB °

## 2016-01-02 ENCOUNTER — Encounter: Payer: Medicaid Other | Admitting: Internal Medicine

## 2016-01-03 ENCOUNTER — Encounter: Payer: Self-pay | Admitting: Internal Medicine

## 2016-01-03 ENCOUNTER — Encounter (INDEPENDENT_AMBULATORY_CARE_PROVIDER_SITE_OTHER): Payer: Self-pay

## 2016-01-03 ENCOUNTER — Ambulatory Visit (INDEPENDENT_AMBULATORY_CARE_PROVIDER_SITE_OTHER): Payer: Medicaid Other | Admitting: Internal Medicine

## 2016-01-03 VITALS — BP 144/84 | HR 105 | Temp 98.1°F | Ht 61.0 in | Wt 260.1 lb

## 2016-01-03 DIAGNOSIS — F17210 Nicotine dependence, cigarettes, uncomplicated: Secondary | ICD-10-CM | POA: Diagnosis not present

## 2016-01-03 DIAGNOSIS — I10 Essential (primary) hypertension: Secondary | ICD-10-CM

## 2016-01-03 DIAGNOSIS — K219 Gastro-esophageal reflux disease without esophagitis: Secondary | ICD-10-CM | POA: Diagnosis not present

## 2016-01-03 DIAGNOSIS — R05 Cough: Secondary | ICD-10-CM

## 2016-01-03 DIAGNOSIS — R059 Cough, unspecified: Secondary | ICD-10-CM | POA: Insufficient documentation

## 2016-01-03 MED ORDER — CETIRIZINE HCL 10 MG PO TABS: 10.0000 mg | ORAL_TABLET | Freq: Every day | ORAL | 3 refills | Status: DC

## 2016-01-03 MED ORDER — HYDROCHLOROTHIAZIDE 12.5 MG PO CAPS: 12.5000 mg | ORAL_CAPSULE | Freq: Every day | ORAL | 1 refills | Status: DC

## 2016-01-03 MED ORDER — PANTOPRAZOLE SODIUM 40 MG PO TBEC: DELAYED_RELEASE_TABLET | ORAL | 0 refills | Status: DC

## 2016-01-03 NOTE — Assessment & Plan Note (Addendum)
Patient with prior GERD symptoms now taking Pantoprazole 40 mg BID and Famotidine 20 mg qhs. She feels her usual heartburn symptoms have improved on her current medications. She was given a kit to test for stool H. Pylori on prior visit which she has brought today.  Will follow up on H. Pylori stool antigen test results and treat if positive. Continue current medications and consider step down therapy if patient's symptoms are not recurring.  ADDENDUM: Stool antigen positive for H pylori. Will start Pylera three capsules TIDAC and bedtime for 10 days. Continue BID PPI. Patient informed of results and plan. She will need repeat H pylori testing 4 weeks after completion of treatment. Retest should be done after holding PPI for 1-2 weeks.

## 2016-01-03 NOTE — Assessment & Plan Note (Signed)
Patient reports recent URI symptoms 2 weeks ago that are resolving now with exception of a lingering cough occasionally productive of small amount of yellow phlegm. She has tried cough drops and Mucinex which provide some relief. Cough does not really bother her. She denies any current rhinorrhea, fevers, chills, nausea, vomiting, or heartburn. She continues to smoke 2.5 cigarettes per day.  Patient's cough likely secondary to recent URI symptoms. May also be contributed from GERD for which she is on therapy or secondary to cigarette use which she is actively working on cutting back further. She is encouraged to continue supportive therapy with anticipation of improvement in the next 1-2 weeks.

## 2016-01-03 NOTE — Assessment & Plan Note (Signed)
Reports adherence to Amlodipine 10 mg daily and HCTZ 12.5 mg daily. BP is 144/84 today.  BP stable, continue current medications.

## 2016-01-03 NOTE — Progress Notes (Signed)
   CC: HTN  HPI:  Ms.Cinthia M Rothenberger is a 50 y.o. female with PMH as listed below who presents for follow up management of her HTN, GERD, Tobacco use, and cough.  HTN: Reports adherence to Amlodipine 10 mg daily and HCTZ 12.5 mg daily. BP is 144/84 today.  GERD: Patient with prior GERD symptoms now taking Pantoprazole 40 mg BID and Famotidine 20 mg qhs. She feels her usual heartburn symptoms have improved on her current medications. She was given a kit to test for stool H. Pylori on prior visit which she has brought today.  Tobacco use: Patient previously smoking 0.5 PPD now down to 2.5 cigarettes per day since starting Chantix about 12 weeks ago. She is motivated to continue cutting back to complete cessation.  Cough: Patient reports recent URI symptoms 2 weeks ago that are resolving now with exception of a lingering cough occasionally productive of small amount of yellow phlegm. She has tried cough drops and Mucinex which provide some relief. Cough does not really bother her. She denies any current rhinorrhea, fevers, chills, nausea, vomiting, or heartburn. She continues to smoke 2.5 cigarettes per day.  Past Medical History:  Diagnosis Date  . Asthma, chronic 03/30/2012  . Essential hypertension, benign 03/30/2012  . Hypertension   . Seasonal allergies 03/30/2012  . Stress incontinence, female 03/30/2012  . Tobacco abuse 03/30/2012    Review of Systems:   Review of Systems  Constitutional: Negative for chills and fever.  HENT: Negative for congestion.   Respiratory: Positive for cough. Negative for shortness of breath and wheezing.   Cardiovascular: Negative for chest pain and leg swelling.  Gastrointestinal: Negative for abdominal pain, heartburn, nausea and vomiting.     Physical Exam:  Vitals:   01/03/16 1335  BP: (!) 144/84  Pulse: (!) 105  Temp: 98.1 F (36.7 C)  TempSrc: Oral  SpO2: 97%  Weight: 260 lb 1.6 oz (118 kg)  Height: 5' 1" (1.549 m)   Physical Exam    Constitutional: She is oriented to person, place, and time. She appears well-developed and well-nourished. No distress.  HENT:  Mouth/Throat: Oropharynx is clear and moist. No oropharyngeal exudate.  Cardiovascular: Regular rhythm.   Slight tachycardia  Pulmonary/Chest: Effort normal. No respiratory distress. She has no wheezes. She has no rales.  Musculoskeletal: She exhibits no edema or tenderness.  Neurological: She is alert and oriented to person, place, and time.  Skin: Skin is warm. She is not diaphoretic.    Assessment & Plan:   See Encounters Tab for problem based charting.  Patient discussed with Dr. Mullen   

## 2016-01-03 NOTE — Assessment & Plan Note (Signed)
Patient previously smoking 0.5 PPD now down to 2.5 cigarettes per day since starting Chantix about 12 weeks ago. She is motivated to continue cutting back to complete cessation.  Patient congratulated on cutting back and counseled on further cessation. Continue Chantix 1 mg BID.

## 2016-01-03 NOTE — Patient Instructions (Signed)
It was a pleasure to see you Crystal Briggs.  Your cough is likely lingering from your recent cold. You can continue Mucinex and cough drops as needed.  We will follow up on your stool test and let you know if you should be treated for a stomach bacteria called H. Pylori.  Good job on cutting back on your smoking, please continue to work on cutting back completely.   Food Choices for Gastroesophageal Reflux Disease, Adult When you have gastroesophageal reflux disease (GERD), the foods you eat and your eating habits are very important. Choosing the right foods can help ease your discomfort. What guidelines do I need to follow?  Choose fruits, vegetables, whole grains, and low-fat dairy products.  Choose low-fat meat, fish, and poultry.  Limit fats such as oils, salad dressings, butter, nuts, and avocado.  Keep a food diary. This helps you identify foods that cause symptoms.  Avoid foods that cause symptoms. These may be different for everyone.  Eat small meals often instead of 3 large meals a day.  Eat your meals slowly, in a place where you are relaxed.  Limit fried foods.  Cook foods using methods other than frying.  Avoid drinking alcohol.  Avoid drinking large amounts of liquids with your meals.  Avoid bending over or lying down until 2-3 hours after eating. What foods are not recommended? These are some foods and drinks that may make your symptoms worse: Vegetables  Tomatoes. Tomato juice. Tomato and spaghetti sauce. Chili peppers. Onion and garlic. Horseradish. Fruits  Oranges, grapefruit, and lemon (fruit and juice). Meats  High-fat meats, fish, and poultry. This includes hot dogs, ribs, ham, sausage, salami, and bacon. Dairy  Whole milk and chocolate milk. Sour cream. Cream. Butter. Ice cream. Cream cheese. Drinks  Coffee and tea. Bubbly (carbonated) drinks or energy drinks. Condiments  Hot sauce. Barbecue sauce. Sweets/Desserts  Chocolate and cocoa. Donuts.  Peppermint and spearmint. Fats and Oils  High-fat foods. This includes Jamaica[place] fries and potato chips. Other  Vinegar. Strong spices. This includes black pepper, white pepper, red pepper, cayenne, curry powder, cloves, ginger, and chili powder. The items listed above may not be a complete list of foods and drinks to avoid. Contact your dietitian for more information.  This information is not intended to replace advice given to you by your health care provider. Make sure you discuss any questions you have with your health care provider. Document Released: 07/02/2011 Document Revised: 06/08/2015 Document Reviewed: 11/04/2012 Elsevier Interactive Patient Education  2017 [employer]

## 2016-01-05 LAB — H. PYLORI ANTIGEN, STOOL: H pylori Ag, Stl: POSITIVE — AB

## 2016-01-05 MED ORDER — BIS SUBCIT-METRONID-TETRACYC 140-125-125 MG PO CAPS: 3.0000 | ORAL_CAPSULE | Freq: Three times a day (TID) | ORAL | 0 refills | Status: DC

## 2016-01-05 NOTE — Addendum Note (Signed)
Addended by: Charlsie QuestPATEL, Mersedes Alber R on: 01/05/2016 12:18 PM   Modules accepted: Orders

## 2016-01-10 NOTE — Progress Notes (Signed)
[facility] Medicine Clinic Attending  Case discussed with Dr. Patel,Vishal soon after the resident saw the patient.  We reviewed the resident's history and exam and pertinent patient test results.  I agree with the assessment, diagnosis, and plan of care documented in the resident's note. 

## 2016-02-14 ENCOUNTER — Other Ambulatory Visit: Payer: Self-pay | Admitting: Internal Medicine

## 2016-02-26 ENCOUNTER — Telehealth: Payer: Self-pay | Admitting: Internal Medicine

## 2016-02-26 ENCOUNTER — Other Ambulatory Visit: Payer: Self-pay | Admitting: *Deleted

## 2016-02-26 MED ORDER — PANTOPRAZOLE SODIUM 40 MG PO TBEC: DELAYED_RELEASE_TABLET | ORAL | 0 refills | Status: DC

## 2016-02-26 NOTE — Telephone Encounter (Signed)
Pt would like a call back as soon as possible about her meds.

## 2016-02-26 NOTE — Telephone Encounter (Signed)
This is done.

## 2016-03-04 ENCOUNTER — Other Ambulatory Visit: Payer: Self-pay | Admitting: Internal Medicine

## 2016-03-07 ENCOUNTER — Other Ambulatory Visit: Payer: Self-pay | Admitting: Internal Medicine

## 2016-03-09 ENCOUNTER — Other Ambulatory Visit: Payer: Self-pay | Admitting: Internal Medicine

## 2016-03-11 NOTE — Telephone Encounter (Signed)
Patient needs appt, thanks

## 2016-03-12 ENCOUNTER — Other Ambulatory Visit: Payer: Self-pay | Admitting: Internal Medicine

## 2016-03-18 ENCOUNTER — Telehealth: Payer: Self-pay | Admitting: Internal Medicine

## 2016-03-18 NOTE — Telephone Encounter (Signed)
APT. REMINDER CALL, LMTCB °

## 2016-03-19 ENCOUNTER — Ambulatory Visit (INDEPENDENT_AMBULATORY_CARE_PROVIDER_SITE_OTHER): Payer: Medicaid Other | Admitting: Internal Medicine

## 2016-03-19 ENCOUNTER — Encounter: Payer: Self-pay | Admitting: Internal Medicine

## 2016-03-19 VITALS — BP 132/71 | HR 98 | Temp 98.3°F | Ht 61.0 in | Wt 263.7 lb

## 2016-03-19 DIAGNOSIS — Z7951 Long term (current) use of inhaled steroids: Secondary | ICD-10-CM

## 2016-03-19 DIAGNOSIS — K219 Gastro-esophageal reflux disease without esophagitis: Secondary | ICD-10-CM | POA: Diagnosis not present

## 2016-03-19 DIAGNOSIS — F17210 Nicotine dependence, cigarettes, uncomplicated: Secondary | ICD-10-CM | POA: Diagnosis not present

## 2016-03-19 DIAGNOSIS — Z716 Tobacco abuse counseling: Secondary | ICD-10-CM

## 2016-03-19 DIAGNOSIS — J45909 Unspecified asthma, uncomplicated: Secondary | ICD-10-CM

## 2016-03-19 DIAGNOSIS — Z8619 Personal history of other infectious and parasitic diseases: Secondary | ICD-10-CM | POA: Diagnosis not present

## 2016-03-19 DIAGNOSIS — I10 Essential (primary) hypertension: Secondary | ICD-10-CM | POA: Diagnosis not present

## 2016-03-19 DIAGNOSIS — Z79899 Other long term (current) drug therapy: Secondary | ICD-10-CM

## 2016-03-19 DIAGNOSIS — Z6841 Body Mass Index (BMI) 40.0 and over, adult: Secondary | ICD-10-CM

## 2016-03-19 DIAGNOSIS — Z0000 Encounter for general adult medical examination without abnormal findings: Secondary | ICD-10-CM

## 2016-03-19 MED ORDER — MONTELUKAST SODIUM 10 MG PO TABS: 10.0000 mg | ORAL_TABLET | Freq: Every day | ORAL | 1 refills | Status: DC

## 2016-03-19 NOTE — Patient Instructions (Signed)
Ms. Rayburn MaBlackmon it was nice seeing you today.  I have given you a referral to gastroenterology for a endoscopy and colonoscopy.

## 2016-03-20 ENCOUNTER — Other Ambulatory Visit: Payer: Self-pay | Admitting: Internal Medicine

## 2016-03-20 NOTE — Progress Notes (Signed)
   CC: Patient is here to discuss hypertension, GERD, and tobacco use.   HPI:  [name] is a 51 y.o. female with a past medical history of conditions listed below presenting to the clinic to discuss hypertension, GERD, and tobacco use. Please see problem based charting for the status of the patient's current and chronic medical conditions.   Past Medical History:  Diagnosis Date  . Asthma, chronic 03/30/2012  . Essential hypertension, benign 03/30/2012  . Hypertension   . Seasonal allergies 03/30/2012  . Stress incontinence, female 03/30/2012  . Tobacco abuse 03/30/2012    Review of Systems:   Review of Systems  Constitutional: Negative for chills and fever.  Respiratory: Negative for shortness of breath and wheezing.   Cardiovascular: Negative for chest pain and leg swelling.  Gastrointestinal: Positive for heartburn. Negative for abdominal pain, diarrhea, nausea and vomiting.    Physical Exam:  Vitals:   03/19/16 1533  BP: 132/71  Pulse: 98  Temp: 98.3 F (36.8 C)  TempSrc: Oral  SpO2: 98%  Weight: 263 lb 11.2 oz (119.6 kg)  Height: 5\' 1"  (1.549 m)   Physical Exam  Constitutional: She is oriented to person, place, and time. She appears well-developed and well-nourished. No distress.  Eyes: EOM are normal.  Neck: Neck supple. No tracheal deviation present.  Cardiovascular: Normal rate, regular rhythm and intact distal pulses.   Pulmonary/Chest: Effort normal and breath sounds normal. No respiratory distress.  Abdominal: Soft. Bowel sounds are normal. She exhibits no distension. There is no tenderness.  Musculoskeletal: Normal range of motion. She exhibits no edema.  Neurological: She is alert and oriented to person, place, and time.  Skin: Skin is warm and dry.    Assessment & Plan:   See Encounters Tab for problem based charting.  Patient discussed with Dr. Josem KaufmannKlima

## 2016-03-20 NOTE — Assessment & Plan Note (Signed)
BP Readings from Last 3 Encounters:  03/19/16 132/71  01/03/16 (!) 144/84  11/09/15 135/75    Lab Results  Component Value Date   NA 140 09/12/2015   K 4.5 09/12/2015   CREATININE 0.70 09/12/2015    Assessment: Blood pressure control:  below goal Comments: Currently on amlodipine 10 mg daily and hydrochlorothiazide 12.5 mg daily.  Plan: Medications:  continue current medications Educational resources provided: brochure

## 2016-03-20 NOTE — Assessment & Plan Note (Signed)
Assessment Patient continues to lead a sedentary lifestyle. She has gained 3 pounds in the past 3 months.  Plan -Educated patient about healthy eating and exercise. Emphasized the importance of weight loss.

## 2016-03-20 NOTE — Assessment & Plan Note (Signed)
Assessment Patient has a history of smoking one half pack per day since age of 51. She has now cut down to 4 cigarettes per day. She was started on Chantix previously but has not filled this prescription since October 2017. I spent a dedicated 5 minutes discussing smoking cessation at this visit. Patient appears to be in the pre-contemplative stage at this time.  Plan -Discuss again at next visit -Consider restarting Chantix if the patient is ready to quit in the future

## 2016-03-20 NOTE — Assessment & Plan Note (Addendum)
Assessment Patient has been taking Protonix 40 mg twice daily for her diagnosis of GERD since August 2017. She was treated for H. pylori in December 2017. She is also taking famotidine 20 mg daily. Patient states her symptoms were fairly well controlled with the twice-daily dose of Protonix + Famotidine, however, she ran out of the medication 2 weeks ago and is now experiencing worsening acid reflux.  Plan -Continue Protonix 40 mg twice daily for now -Continue famotidine -Since patient is taking a high dose of PPI chronically, will refer her to GI for an EGD to look for an inflammatory vs infectious source such as eosinophilic esophagitis. A motility disorder is also on the differential. If the EGD does not show a source, consider nonulcer dyspepsia as a possible etiology.

## 2016-03-20 NOTE — Assessment & Plan Note (Signed)
Assessment Stable. Patient is currently on Singulair and Symbicort. She is requesting a refill on Singulair.  Plan -Refill Singulair

## 2016-03-20 NOTE — Assessment & Plan Note (Signed)
Referral for screening colonoscopy has been placed previously. Patient states GI office will not accept her. However, I was informed by our staff that the patient has been a "no-show" to her appointment with GI in the past. I educated the patient about the importance of a screening colonoscopy.  -Our office will try again to set her up for an appointment again

## 2016-03-21 NOTE — Progress Notes (Signed)
Case discussed with Dr. Rathore at the time of the visit.  We reviewed the resident's history and exam and pertinent patient test results.  I agree with the assessment, diagnosis and plan of care documented in the resident's note. 

## 2016-03-22 ENCOUNTER — Other Ambulatory Visit: Payer: Self-pay

## 2016-03-22 MED ORDER — PANTOPRAZOLE SODIUM 40 MG PO TBEC: DELAYED_RELEASE_TABLET | ORAL | 0 refills | Status: DC

## 2016-03-22 NOTE — Telephone Encounter (Signed)
Pt called / informed of refills. 

## 2016-03-22 NOTE — Telephone Encounter (Signed)
I have already talked to pt - she needs refill on Meloxicam and Pantoprazole. \I called pt back - [facility]told her [facility]. Stated someone told her Mamie is w/a pt - repeated Mamie is not here. And I just talked to her and I'm triage nurse today. Stated ok.

## 2016-03-22 NOTE — Telephone Encounter (Signed)
Needs to speak with a nurse about meds. Please call back.  

## 2016-03-22 NOTE — Telephone Encounter (Signed)
Pt is calling back to speak with a nurse. Please call back.  

## 2016-03-24 ENCOUNTER — Other Ambulatory Visit: Payer: Self-pay | Admitting: Internal Medicine

## 2016-05-06 ENCOUNTER — Other Ambulatory Visit: Payer: Self-pay | Admitting: Gastroenterology

## 2016-05-06 DIAGNOSIS — Z6841 Body Mass Index (BMI) 40.0 and over, adult: Secondary | ICD-10-CM | POA: Diagnosis not present

## 2016-05-06 DIAGNOSIS — K219 Gastro-esophageal reflux disease without esophagitis: Secondary | ICD-10-CM | POA: Diagnosis not present

## 2016-05-06 DIAGNOSIS — Z1211 Encounter for screening for malignant neoplasm of colon: Secondary | ICD-10-CM | POA: Diagnosis not present

## 2016-05-15 ENCOUNTER — Other Ambulatory Visit: Payer: Self-pay | Admitting: Internal Medicine

## 2016-05-16 NOTE — Telephone Encounter (Signed)
amLODipine (NORVASC) 10 MG tablet, refill request.  

## 2016-05-16 NOTE — Addendum Note (Signed)
Addended by: Hassan BucklerPALMER, Rodrigus Kilker H on: 05/16/2016 01:20 PM   Modules accepted: Orders

## 2016-05-20 ENCOUNTER — Other Ambulatory Visit: Payer: Self-pay | Admitting: *Deleted

## 2016-05-22 MED ORDER — AMLODIPINE BESYLATE 10 MG PO TABS: 10.0000 mg | ORAL_TABLET | Freq: Every day | ORAL | 3 refills | Status: DC

## 2016-05-23 ENCOUNTER — Ambulatory Visit (HOSPITAL_COMMUNITY): Admit: 2016-05-23 | Payer: Medicaid Other | Admitting: Gastroenterology

## 2016-05-23 ENCOUNTER — Encounter (HOSPITAL_COMMUNITY): Payer: Self-pay

## 2016-05-23 SURGERY — COLONOSCOPY WITH PROPOFOL
Anesthesia: Monitor Anesthesia Care

## 2016-06-01 ENCOUNTER — Other Ambulatory Visit: Payer: Self-pay | Admitting: Internal Medicine

## 2016-06-18 ENCOUNTER — Ambulatory Visit (INDEPENDENT_AMBULATORY_CARE_PROVIDER_SITE_OTHER): Payer: Medicaid Other | Admitting: Internal Medicine

## 2016-06-18 ENCOUNTER — Ambulatory Visit (HOSPITAL_COMMUNITY)
Admission: RE | Admit: 2016-06-18 | Discharge: 2016-06-18 | Disposition: A | Discharge status: Home / Self Care | Payer: Medicaid Other | Source: Ambulatory Visit | Attending: Internal Medicine | Admitting: Internal Medicine

## 2016-06-18 ENCOUNTER — Encounter: Payer: Self-pay | Admitting: Internal Medicine

## 2016-06-18 VITALS — BP 151/83 | HR 101 | Temp 98.2°F | Wt 267.8 lb

## 2016-06-18 DIAGNOSIS — K219 Gastro-esophageal reflux disease without esophagitis: Secondary | ICD-10-CM | POA: Diagnosis not present

## 2016-06-18 DIAGNOSIS — R000 Tachycardia, unspecified: Secondary | ICD-10-CM | POA: Insufficient documentation

## 2016-06-18 DIAGNOSIS — Z7951 Long term (current) use of inhaled steroids: Secondary | ICD-10-CM

## 2016-06-18 DIAGNOSIS — Z79899 Other long term (current) drug therapy: Secondary | ICD-10-CM

## 2016-06-18 DIAGNOSIS — M545 Low back pain: Secondary | ICD-10-CM | POA: Diagnosis not present

## 2016-06-18 DIAGNOSIS — N393 Stress incontinence (female) (male): Secondary | ICD-10-CM | POA: Diagnosis not present

## 2016-06-18 DIAGNOSIS — J45909 Unspecified asthma, uncomplicated: Secondary | ICD-10-CM | POA: Diagnosis not present

## 2016-06-18 DIAGNOSIS — Z889 Allergy status to unspecified drugs, medicaments and biological substances status: Secondary | ICD-10-CM

## 2016-06-18 DIAGNOSIS — Z6841 Body Mass Index (BMI) 40.0 and over, adult: Secondary | ICD-10-CM | POA: Diagnosis not present

## 2016-06-18 DIAGNOSIS — G8929 Other chronic pain: Secondary | ICD-10-CM | POA: Diagnosis not present

## 2016-06-18 DIAGNOSIS — I4581 Long QT syndrome: Secondary | ICD-10-CM | POA: Insufficient documentation

## 2016-06-18 DIAGNOSIS — R609 Edema, unspecified: Secondary | ICD-10-CM

## 2016-06-18 DIAGNOSIS — I10 Essential (primary) hypertension: Secondary | ICD-10-CM

## 2016-06-18 DIAGNOSIS — F17210 Nicotine dependence, cigarettes, uncomplicated: Secondary | ICD-10-CM

## 2016-06-18 MED ORDER — ASPIRIN EC 81 MG PO TBEC: 81.0000 mg | DELAYED_RELEASE_TABLET | Freq: Every day | ORAL | 3 refills | Status: DC

## 2016-06-18 MED ORDER — HYDROCHLOROTHIAZIDE 25 MG PO TABS: 25.0000 mg | ORAL_TABLET | Freq: Every day | ORAL | 1 refills | Status: DC

## 2016-06-18 MED ORDER — PANTOPRAZOLE SODIUM 40 MG PO TBEC: DELAYED_RELEASE_TABLET | ORAL | 0 refills | Status: DC

## 2016-06-18 MED ORDER — ALBUTEROL SULFATE HFA 108 (90 BASE) MCG/ACT IN AERS
INHALATION_SPRAY | RESPIRATORY_TRACT | 3 refills | Status: DC
Start: 2016-06-18 — End: 2016-09-26

## 2016-06-18 MED ORDER — DICLOFENAC SODIUM 1 % TD GEL: 2.0000 g | Freq: Four times a day (QID) | TRANSDERMAL | 3 refills | Status: DC

## 2016-06-18 MED ORDER — BUDESONIDE-FORMOTEROL FUMARATE 160-4.5 MCG/ACT IN AERO: 2.0000 | INHALATION_SPRAY | Freq: Two times a day (BID) | RESPIRATORY_TRACT | 3 refills | Status: DC

## 2016-06-18 MED ORDER — FLUTICASONE PROPIONATE 50 MCG/ACT NA SUSP: NASAL | 1 refills | Status: DC

## 2016-06-18 NOTE — Patient Instructions (Signed)
Ms. Rayburn MaBlackmon it was nice seeing you today.  Dose of Hydrochlorothiazide has been increased: Take 25 mg once daily  STOP taking Imipramine   STOP taking Tolterodine   Please make an appointment with gastroenterology for a visit soon.

## 2016-06-19 LAB — BMP8+ANION GAP
Anion Gap: 14 mmol/L (ref 10.0–18.0)
BUN/Creatinine Ratio: 17 (ref 9–23)
BUN: 11 mg/dL (ref 6–24)
CALCIUM: 9.7 mg/dL (ref 8.7–10.2)
CHLORIDE: 95 mmol/L — AB (ref 96–106)
CO2: 30 mmol/L — ABNORMAL HIGH (ref 18–29)
Creatinine, Ser: 0.63 mg/dL (ref 0.57–1.00)
GFR calc Af Amer: 120 mL/min/{1.73_m2} (ref >59)
GFR calc non Af Amer: 104 mL/min/{1.73_m2} (ref >59)
GLUCOSE: 95 mg/dL (ref 65–99)
POTASSIUM: 4.2 mmol/L (ref 3.5–5.2)
Sodium: 139 mmol/L (ref 134–144)

## 2016-06-19 NOTE — Assessment & Plan Note (Signed)
Assessment Patient has a history of stress incontinence. She is multiparous with prior vaginal deliveries. States she has urine leakage whenever she coughs and continues to have accidents at night 2-3 times times a week. Denies having any urinary urgency and states she is able to empty her bladder without any problem. She does not consume alcohol and drinks decaffeinated coffee once a day. Patient is currently taking tolterodine and imipramine. States these medications are not really helping with her symptoms. Based on her history today, it seems patient has stress incontinence. I explained to her that these medications will not help with this condition.  Plan -Referral for pelvic floor physical therapy

## 2016-06-19 NOTE — Assessment & Plan Note (Signed)
Assessment Stable. Patient has no complaints. She is requesting a refill on Symbicort and albuterol.  Plan -Symbicort and albuterol refilled

## 2016-06-19 NOTE — Assessment & Plan Note (Signed)
Assessment Stable. Patient has no complaints. He is requesting a refill on Flonase.  Plan -Flonase refilled

## 2016-06-19 NOTE — Assessment & Plan Note (Signed)
Assessment Patient has no complaints. She is requesting a refill on Voltaren gel.  Plan -Voltaren gel refilled

## 2016-06-19 NOTE — Progress Notes (Signed)
   CC: Patient is here to discuss hypertension, GERD, and stress incontinence. In addition, she is requesting medication refills.  HPI:  Ms.Kortney Judie PetitM Rayburn MaBlackmon is a 51 y.o. female with a past medical history of conditions listed below presenting to the clinic to discuss hypertension, GERD, and stress incontinence. Patient is also requesting medication refills. Please see problem based charting for the status of the patient's current and chronic medical conditions.   Past Medical History:  Diagnosis Date  . Asthma, chronic 03/30/2012  . Essential hypertension, benign 03/30/2012  . Hypertension   . Seasonal allergies 03/30/2012  . Stress incontinence, female 03/30/2012  . Tobacco abuse 03/30/2012    Review of Systems:   Review of Systems  Constitutional: Negative for chills and fever.  Respiratory: Negative for shortness of breath.   Cardiovascular: Negative for leg swelling.  Gastrointestinal: Negative for abdominal pain, nausea and vomiting.    Physical Exam:  Vitals:   06/18/16 1426 06/18/16 1650  BP: (!) 161/82 (!) 151/83  Pulse: (!) 108 (!) 101  Temp: 98.2 F (36.8 C)   TempSrc: Oral   SpO2: 100%   Weight: 267 lb 12.8 oz (121.5 kg)    Physical Exam  Constitutional: She is oriented to person, place, and time. She appears well-developed and well-nourished. No distress.  HENT:  Head: Normocephalic and atraumatic.  Eyes: Right eye exhibits no discharge. Left eye exhibits no discharge.  Neck: Neck supple. No tracheal deviation present.  Cardiovascular: Normal rate, regular rhythm and intact distal pulses.   Pulmonary/Chest: Effort normal and breath sounds normal. No respiratory distress. She has no wheezes. She has no rales.  Abdominal: Soft. Bowel sounds are normal. She exhibits no distension. There is no tenderness.  Musculoskeletal:  Trace pedal edema  Neurological: She is alert and oriented to person, place, and time.  Skin: Skin is warm and dry.    Assessment & Plan:     See Encounters Tab for problem based charting.  Patient discussed with Dr. Heide SparkNarendra

## 2016-06-19 NOTE — Assessment & Plan Note (Signed)
BP Readings from Last 3 Encounters:  06/18/16 (!) 151/83  03/19/16 132/71  01/03/16 (!) 144/84    Lab Results  Component Value Date   NA 139 06/18/2016   K 4.2 06/18/2016   CREATININE 0.63 06/18/2016    Assessment: Blood pressure control:  above goal Comments: Current medication regimen includes amlodipine 10 mg daily and hydrochlorothiazide 12.5 mg daily. Patient reports compliance.  Plan: Medications: Increase dose of hydrochlorothiazide to 25 mg daily. Continue amlodipine as above. Educational resources provided:   Educated patient about healthy eating and exercise. Emphasized the importance of weight loss.  Other plans:  -Return to the clinic in 1 month for blood pressure recheck

## 2016-06-19 NOTE — Assessment & Plan Note (Signed)
Assessment Patient has been taking Protonix 40 mg twice daily chronically. She is also taking famotidine 20 mg once daily. She was previously referred to GI for an EGD to look for an inflammatory versus infectious source such as eosinophilic esophagitis. A motility disorder is also on the differential. Patient has not followed up with GI yet.  Plan -Encouraged her to make an appointment with gastroenterology soon -If EGD does not show a source, consider nonulcer dyspepsia as a possible etiology

## 2016-06-19 NOTE — Assessment & Plan Note (Signed)
Assessment Patient noted to have trace pedal edema on exam. Lungs clear and she does not have any respiratory complaints. BMP at this visit showing normal renal function. TSH checked in September 2017 was normal. Her trace pedal edema is likely secondary to venous insufficiency. Risk factors include morbid obesity and a sedentary lifestyle.   Plan -Continue to monitor -Encouraged patient to exercise and lose weight

## 2016-06-19 NOTE — Assessment & Plan Note (Signed)
Assessment Patient was noted to be tachycardic at this visit with initial pulse 108 and repeat 101. She was asymptomatic. EKG showing normal sinus rhythm with heart rate 100. No acute ischemic changes noted.  Plan -Continue to monitor -No acute intervention needed at this time

## 2016-06-20 ENCOUNTER — Telehealth: Payer: Self-pay | Admitting: *Deleted

## 2016-06-20 ENCOUNTER — Other Ambulatory Visit: Payer: Self-pay | Admitting: Internal Medicine

## 2016-06-20 DIAGNOSIS — N393 Stress incontinence (female) (male): Secondary | ICD-10-CM

## 2016-06-20 NOTE — Telephone Encounter (Signed)
CALLED AND SPOKE WITH PATIENT. GAVE HER UROLOGY REFERRAL INFORMATION. 7-26-018 ARRIVE 10AM / [facility].  APPOINTMENT ALSO MAILED TO PATIENT.

## 2016-06-20 NOTE — Progress Notes (Signed)
Internal Medicine Clinic Attending  Case discussed with Dr. Rathoreat the time of the visit. We reviewed the resident's history and exam and pertinent patient test results. I agree with the assessment, diagnosis, and plan of care documented in the resident's note.  

## 2016-06-20 NOTE — Addendum Note (Signed)
Addended by: Dorie RankPOWERS, Tanecia Mccay on: 06/20/2016 11:21 AM   Modules accepted: Orders

## 2016-06-24 ENCOUNTER — Other Ambulatory Visit: Payer: Self-pay | Admitting: Internal Medicine

## 2016-06-24 NOTE — Telephone Encounter (Signed)
Pt calls and states she wanted to continue the detrol-la but not the imipramine, states she did not know both were discontinued, she wants a script for the detrol-la sent to her pharmacy

## 2016-06-24 NOTE — Telephone Encounter (Signed)
Patient's history is consistent with stress incontinence. As such, during her recent visit on June 5th, she was advised to stop taking imipramine and Tolterodine. She was referred to urology. Patient called the clinic today requesting a refill on Tolterodine. I spoke to her over the phone. Stated she continued to take Tolteodine since her last visit. She ran out of the medication a day ago and had urine leakage while visiting a school today. Patient believes Tolterodine was helping control her symptoms. States her urology appointment is scheduled on 08/08/2016.  -Will refill Tolterodine at this time as worsening symptoms can cause her embarrassment. Encouraged her to go to her appointment with urology.

## 2016-07-08 ENCOUNTER — Other Ambulatory Visit: Payer: Self-pay

## 2016-07-08 NOTE — Telephone Encounter (Signed)
Requesting imipramine to be filled. Please call pt back.

## 2016-07-10 MED ORDER — IMIPRAMINE HCL 25 MG PO TABS: 25.0000 mg | ORAL_TABLET | Freq: Every day | ORAL | 0 refills | Status: DC

## 2016-07-11 ENCOUNTER — Other Ambulatory Visit: Payer: Self-pay | Admitting: Internal Medicine

## 2016-07-11 NOTE — Telephone Encounter (Signed)
NEEDS REFILL ON NOSE SPRAY, [phone]

## 2016-07-11 NOTE — Telephone Encounter (Signed)
Called pharm, spoke w/ tammy she stated "nose spray" had been ready since yesterday and [facility] sent a [facility] message to inform pt, called pt she states she did not get message and will call to see if it is ready, informed her that it was ready since 6/27, she will check

## 2016-07-16 ENCOUNTER — Encounter: Payer: Self-pay | Admitting: Internal Medicine

## 2016-07-16 ENCOUNTER — Ambulatory Visit (INDEPENDENT_AMBULATORY_CARE_PROVIDER_SITE_OTHER): Payer: Medicaid Other | Admitting: Internal Medicine

## 2016-07-16 VITALS — BP 113/67 | HR 107 | Temp 98.4°F | Ht 61.0 in | Wt 262.2 lb

## 2016-07-16 DIAGNOSIS — F17210 Nicotine dependence, cigarettes, uncomplicated: Secondary | ICD-10-CM | POA: Diagnosis not present

## 2016-07-16 DIAGNOSIS — R000 Tachycardia, unspecified: Secondary | ICD-10-CM

## 2016-07-16 DIAGNOSIS — Z79899 Other long term (current) drug therapy: Secondary | ICD-10-CM

## 2016-07-16 DIAGNOSIS — I10 Essential (primary) hypertension: Secondary | ICD-10-CM

## 2016-07-16 NOTE — Patient Instructions (Signed)
Continue taking Amlodipine and Hydrochlorothiazide

## 2016-07-17 ENCOUNTER — Other Ambulatory Visit: Payer: Self-pay | Admitting: Internal Medicine

## 2016-07-17 NOTE — Assessment & Plan Note (Signed)
Assessment Patient noted to be tachycardic at this visit and during her previous visit. Initial pulse 107. I checked it again during exam and it was 97. EKG done during her previous visit showing sinus tachycardia. Her tachycardia is likely related to obesity, cigarette smoking, and use of beta agonists for asthma.  Plan -If she continues to be tachycardic at her next visit, consider ordering an echocardiogram to rule out cardiomyopathy -Continue to discuss smoking cessation at future visits -Continue to encourage healthy eating and exercise

## 2016-07-17 NOTE — Progress Notes (Signed)
   CC: Patient is here to discuss hypertension. Tobacco use and tachycardia were also discussed at this visit.  HPI:  Crystal Briggs is a 51 y.o. female with a past medical history of conditions listed below presenting to clinic to discuss her hypertension. Tobacco use and tachycardia were also discussing this visit. Please see problem based charting for the status of the patient's current and chronic medical conditions.   Past Medical History:  Diagnosis Date  . Asthma, chronic 03/30/2012  . Essential hypertension, benign 03/30/2012  . Hypertension   . Seasonal allergies 03/30/2012  . Stress incontinence, female 03/30/2012  . Tobacco abuse 03/30/2012   Review of Systems: Pertinent positives mentioned in HPI. Remainder of all ROS negative.   Physical Exam:  Vitals:   07/16/16 1348  BP: 113/67  Pulse: (!) 107  Temp: 98.4 F (36.9 C)  TempSrc: Oral  SpO2: 97%  Weight: 262 lb 3.2 oz (118.9 kg)  Height: 5\' 1"  (1.549 m)   Physical Exam  Constitutional: She is oriented to person, place, and time. She appears well-developed and well-nourished. No distress.  HENT:  Head: Normocephalic and atraumatic.  Eyes: Right eye exhibits no discharge. Left eye exhibits no discharge.  Cardiovascular: Normal rate, regular rhythm and intact distal pulses.   Pulmonary/Chest: Effort normal and breath sounds normal. No respiratory distress. She has no wheezes. She has no rales.  Abdominal: Soft. Bowel sounds are normal. She exhibits no distension. There is no tenderness.  Musculoskeletal: She exhibits no edema.  Neurological: She is alert and oriented to person, place, and time.  Skin: Skin is warm and dry.    Assessment & Plan:   See Encounters Tab for problem based charting.  Patient discussed with Dr. Oswaldo DoneVincent

## 2016-07-17 NOTE — Assessment & Plan Note (Signed)
BP Readings from Last 3 Encounters:  07/16/16 113/67  06/18/16 (!) 151/83  03/19/16 132/71    Lab Results  Component Value Date   NA 139 06/18/2016   K 4.2 06/18/2016   CREATININE 0.63 06/18/2016    Assessment: Blood pressure control:  well-controlled Comments: Current medication regimen includes amlodipine 10 mg daily and hydrochlorothiazide 25 mg daily. Patient was happy to report she has been watching her diet and has lost 5 pounds in the past 1 month.  Plan: Medications:  continue current medications. Congratulated her on her recent weight loss and encouraged her to continue eating healthy and exercising. Educational resources provided:  (denies need ) Self management tools provided: home blood pressure logbook (denies need )

## 2016-07-17 NOTE — Assessment & Plan Note (Signed)
Assessment Patient continues to smoke cigarettes. I discussed smoking cessation and she continues to be in the pre-contemplative stage at this time.  Plan -Discuss again at next visit

## 2016-07-18 NOTE — Progress Notes (Signed)
Internal Medicine Clinic Attending  Case discussed with Dr. Rathoreat the time of the visit. We reviewed the resident's history and exam and pertinent patient test results. I agree with the assessment, diagnosis, and plan of care documented in the resident's note.  

## 2016-08-05 ENCOUNTER — Other Ambulatory Visit: Payer: Self-pay | Admitting: Internal Medicine

## 2016-08-07 NOTE — Telephone Encounter (Signed)
Called pt - no answer; left message to call for an appt w/GI per Dr Loney Lohathore.

## 2016-08-08 DIAGNOSIS — R350 Frequency of micturition: Secondary | ICD-10-CM | POA: Diagnosis not present

## 2016-08-08 DIAGNOSIS — N393 Stress incontinence (female) (male): Secondary | ICD-10-CM | POA: Diagnosis not present

## 2016-08-08 DIAGNOSIS — R351 Nocturia: Secondary | ICD-10-CM | POA: Diagnosis not present

## 2016-08-27 ENCOUNTER — Other Ambulatory Visit: Payer: Self-pay | Admitting: Internal Medicine

## 2016-08-28 NOTE — Telephone Encounter (Signed)
LM to call us back (per Dr. Loney Lohathore, pt needs to see GI & urology asap before any more refills of Detrol & protonix.

## 2016-09-04 ENCOUNTER — Other Ambulatory Visit: Payer: Self-pay | Admitting: Internal Medicine

## 2016-09-04 DIAGNOSIS — Z1231 Encounter for screening mammogram for malignant neoplasm of breast: Secondary | ICD-10-CM

## 2016-09-13 ENCOUNTER — Other Ambulatory Visit: Payer: Self-pay | Admitting: Internal Medicine

## 2016-09-13 NOTE — Telephone Encounter (Signed)
Refill Request  Tolterodine   Protonix

## 2016-09-15 ENCOUNTER — Other Ambulatory Visit: Payer: Self-pay | Admitting: Internal Medicine

## 2016-09-17 MED ORDER — PANTOPRAZOLE SODIUM 40 MG PO TBEC: DELAYED_RELEASE_TABLET | ORAL | 0 refills | Status: DC

## 2016-09-18 ENCOUNTER — Ambulatory Visit: Payer: Medicaid Other

## 2016-09-19 NOTE — Telephone Encounter (Signed)
Call from patient stating her protonix and meloxicam was not at the pharmacy -after calling to the pharmacy, I was made aware that rx was unable to be filled because MD's needs to be re-enrolled to Medicaid.  Name on rx changed to an attending MD Criselda Peaches(Mullen).  Pt aware.  No further action needed, phone call complete.Criss AlvineGoldston, Carmaleta Youngers Cassady9/6/20182:00 PM

## 2016-09-25 ENCOUNTER — Other Ambulatory Visit: Payer: Self-pay

## 2016-09-25 NOTE — Telephone Encounter (Signed)
budesonide-formoterol (SYMBICORT) 160-4.5 MCG/ACT inhaler, refill request.

## 2016-09-25 NOTE — Telephone Encounter (Signed)
albuterol (PROVENTIL HFA) 108 (90 Base) MCG/ACT inhaler, [facility] [address]

## 2016-09-25 NOTE — Telephone Encounter (Signed)
Pt had refills at pharmacy, called and they will fill and call pt

## 2016-09-26 ENCOUNTER — Other Ambulatory Visit: Payer: Self-pay | Admitting: Internal Medicine

## 2016-09-26 DIAGNOSIS — J4530 Mild persistent asthma, uncomplicated: Secondary | ICD-10-CM

## 2016-09-26 MED ORDER — BUDESONIDE-FORMOTEROL FUMARATE 160-4.5 MCG/ACT IN AERO: 2.0000 | INHALATION_SPRAY | Freq: Two times a day (BID) | RESPIRATORY_TRACT | 3 refills | Status: DC

## 2016-09-26 MED ORDER — ALBUTEROL SULFATE HFA 108 (90 BASE) MCG/ACT IN AERS: 1.0000 | INHALATION_SPRAY | Freq: Four times a day (QID) | RESPIRATORY_TRACT | 3 refills | Status: DC | PRN

## 2016-09-26 NOTE — Telephone Encounter (Signed)
[facility] at [facility]ill left; stated prescriber, Dr Loney Lohathore, not medicaid approved. Doris made aware.

## 2016-09-26 NOTE — Telephone Encounter (Signed)
NEEDS REFILL ON INHALERS,, NO REFILLS LEFT

## 2016-09-30 ENCOUNTER — Other Ambulatory Visit: Payer: Self-pay | Admitting: Internal Medicine

## 2016-09-30 MED ORDER — FLUTICASONE PROPIONATE 50 MCG/ACT NA SUSP: NASAL | 1 refills | Status: DC

## 2016-10-01 ENCOUNTER — Ambulatory Visit
Admission: RE | Admit: 2016-10-01 | Discharge: 2016-10-01 | Disposition: A | Discharge status: Home / Self Care | Payer: Medicaid Other | Source: Ambulatory Visit | Attending: Internal Medicine | Admitting: Internal Medicine

## 2016-10-01 ENCOUNTER — Encounter: Payer: Self-pay | Admitting: Internal Medicine

## 2016-10-01 DIAGNOSIS — Z1231 Encounter for screening mammogram for malignant neoplasm of breast: Secondary | ICD-10-CM

## 2016-10-02 ENCOUNTER — Telehealth: Payer: Self-pay

## 2016-10-02 NOTE — Telephone Encounter (Signed)
hydrochlorothiazide (HYDRODIURIL) 25 MG tablet, [facility] [address]

## 2016-10-04 NOTE — Telephone Encounter (Signed)
Had refill at [facility], they filled and called her

## 2016-11-19 ENCOUNTER — Encounter: Payer: Self-pay | Admitting: Internal Medicine

## 2016-11-19 ENCOUNTER — Ambulatory Visit (INDEPENDENT_AMBULATORY_CARE_PROVIDER_SITE_OTHER): Payer: Medicaid Other | Admitting: Internal Medicine

## 2016-11-19 ENCOUNTER — Encounter (INDEPENDENT_AMBULATORY_CARE_PROVIDER_SITE_OTHER): Payer: Self-pay

## 2016-11-19 VITALS — BP 122/56 | HR 93 | Temp 98.0°F | Ht 61.0 in | Wt 270.1 lb

## 2016-11-19 DIAGNOSIS — Z23 Encounter for immunization: Secondary | ICD-10-CM

## 2016-11-19 DIAGNOSIS — J45909 Unspecified asthma, uncomplicated: Secondary | ICD-10-CM | POA: Diagnosis not present

## 2016-11-19 DIAGNOSIS — K5909 Other constipation: Secondary | ICD-10-CM

## 2016-11-19 DIAGNOSIS — Z6841 Body Mass Index (BMI) 40.0 and over, adult: Secondary | ICD-10-CM | POA: Diagnosis not present

## 2016-11-19 DIAGNOSIS — Z79899 Other long term (current) drug therapy: Secondary | ICD-10-CM

## 2016-11-19 DIAGNOSIS — E785 Hyperlipidemia, unspecified: Secondary | ICD-10-CM | POA: Diagnosis not present

## 2016-11-19 DIAGNOSIS — Z7951 Long term (current) use of inhaled steroids: Secondary | ICD-10-CM | POA: Diagnosis not present

## 2016-11-19 DIAGNOSIS — F17210 Nicotine dependence, cigarettes, uncomplicated: Secondary | ICD-10-CM

## 2016-11-19 DIAGNOSIS — I10 Essential (primary) hypertension: Secondary | ICD-10-CM

## 2016-11-19 DIAGNOSIS — N393 Stress incontinence (female) (male): Secondary | ICD-10-CM | POA: Diagnosis not present

## 2016-11-19 DIAGNOSIS — J4530 Mild persistent asthma, uncomplicated: Secondary | ICD-10-CM

## 2016-11-19 DIAGNOSIS — K219 Gastro-esophageal reflux disease without esophagitis: Secondary | ICD-10-CM

## 2016-11-19 DIAGNOSIS — M25561 Pain in right knee: Secondary | ICD-10-CM

## 2016-11-19 DIAGNOSIS — M1711 Unilateral primary osteoarthritis, right knee: Secondary | ICD-10-CM | POA: Insufficient documentation

## 2016-11-19 DIAGNOSIS — Z0000 Encounter for general adult medical examination without abnormal findings: Secondary | ICD-10-CM

## 2016-11-19 MED ORDER — PANTOPRAZOLE SODIUM 40 MG PO TBEC: DELAYED_RELEASE_TABLET | ORAL | 0 refills | Status: DC

## 2016-11-19 MED ORDER — BUDESONIDE-FORMOTEROL FUMARATE 160-4.5 MCG/ACT IN AERO: 2.0000 | INHALATION_SPRAY | Freq: Two times a day (BID) | RESPIRATORY_TRACT | 3 refills | Status: DC

## 2016-11-19 MED ORDER — ALBUTEROL SULFATE HFA 108 (90 BASE) MCG/ACT IN AERS: 1.0000 | INHALATION_SPRAY | Freq: Four times a day (QID) | RESPIRATORY_TRACT | 3 refills | Status: DC | PRN

## 2016-11-19 MED ORDER — DICLOFENAC SODIUM 1 % TD GEL: 2.0000 g | Freq: Four times a day (QID) | TRANSDERMAL | 3 refills | Status: DC

## 2016-11-19 MED ORDER — ATORVASTATIN CALCIUM 40 MG PO TABS: 40.0000 mg | ORAL_TABLET | Freq: Every day | ORAL | 3 refills | Status: DC

## 2016-11-19 MED ORDER — HYDROCHLOROTHIAZIDE 25 MG PO TABS: 25.0000 mg | ORAL_TABLET | Freq: Every day | ORAL | 1 refills | Status: DC

## 2016-11-19 MED ORDER — FAMOTIDINE 20 MG PO TABS: 20.0000 mg | ORAL_TABLET | Freq: Every day | ORAL | 1 refills | Status: DC

## 2016-11-19 MED ORDER — POLYETHYLENE GLYCOL 3350 17 G PO PACK: 17.0000 g | PACK | Freq: Every day | ORAL | 0 refills | Status: DC

## 2016-11-19 MED ORDER — ACETAMINOPHEN 500 MG PO TABS: 1000.0000 mg | ORAL_TABLET | Freq: Three times a day (TID) | ORAL | 2 refills | Status: AC | PRN

## 2016-11-19 MED ORDER — SENNOSIDES-DOCUSATE SODIUM 8.6-50 MG PO TABS: 1.0000 | ORAL_TABLET | Freq: Every evening | ORAL | 0 refills | Status: DC | PRN

## 2016-11-19 NOTE — Assessment & Plan Note (Signed)
Patient reports having pain in her right knee for the past 1 month. No preceding injury. Reports having daily, intermittent pain which is worse with walking. States she has been using Voltaren gel and it seems to help. She does not believe meloxicam helps with her pain. Knee exam positive for crepitus but otherwise benign. Her knee pain is likely secondary to osteoarthritis in the setting of morbid obesity. Patient has gained 8 pounds since her previous visit 4 months ago.  Plan -Discontinue meloxicam -Tylenol 1000 mg every 8 hours as needed -Continue Voltaren gel 4 times a day -Emphasized the importance of weight loss -Consider getting imaging of her knee at future visit if no improvement with conservative measures.

## 2016-11-19 NOTE — Assessment & Plan Note (Signed)
BP Readings from Last 3 Encounters:  11/19/16 (!) 122/56  07/16/16 113/67  06/18/16 (!) 151/83    Lab Results  Component Value Date   NA 139 06/18/2016   K 4.2 06/18/2016   CREATININE 0.63 06/18/2016    Assessment: Blood pressure control:  below goal Progress toward BP goal:   improved Comments: Currently on amlodipine 10 mg daily and hydrochlorothiazide 25 mg daily.  Plan: Medications:  continue current medications. Discussed healthy eating and need for weight loss. Educational resources provided: brochure(denies need )

## 2016-11-19 NOTE — Progress Notes (Signed)
Internal Medicine Clinic Attending  Case discussed with Dr. Rathoreat the time of the visit. We reviewed the resident's history and exam and pertinent patient test results. I agree with the assessment, diagnosis, and plan of care documented in the resident's note.  

## 2016-11-19 NOTE — Assessment & Plan Note (Signed)
Currently taking Myrbetriq 50 mg daily. Diagnosed with mild stress incontinence; followed by urology.

## 2016-11-19 NOTE — Assessment & Plan Note (Signed)
Stable. Patient is requesting refills on Symbicort and albuterol. States she is using her albuterol rescue inhaler only on occasion.  Plan -Refill medications

## 2016-11-19 NOTE — Assessment & Plan Note (Signed)
Patient reports having chronic constipation; one bowel movement per week on average. Denies having any abdominal pain, nausea, or vomiting. She continues to pass flatus. Abdominal exam benign, normoactive bowel sounds.  Plan -MiraLAX daily -Senna-docusate at bedtime as needed -Encouraged her to drink plenty of water and increase her dietary fiber intake.

## 2016-11-19 NOTE — Assessment & Plan Note (Signed)
Smoking 8 cigarettes per day. Discussed the importance of smoking cessation. She continues to be in the pre-contemplative stage at this time.   Plan -Discuss again at next visit

## 2016-11-19 NOTE — Patient Instructions (Addendum)
STOP taking Mobic  Start taking Tylenol as instructed for your knee pain. In addition, use Voltaren gel as instructed.   Take Miralax and Senna-Docusate as instructed for constipation. Increasing your dietary fiber intake and drinking enough water will also help.

## 2016-11-19 NOTE — Assessment & Plan Note (Signed)
Stable. Atorvastatin refilled per patient request.

## 2016-11-19 NOTE — Assessment & Plan Note (Signed)
Patient was seen by GI in April 2018. GERD is currently well-controlled with Protonix 40 mg daily and famotidine 20 mg daily.   Plan -Continue current medications -Emphasized the importance of weight loss -Encouraged her to stop smoking

## 2016-11-19 NOTE — Progress Notes (Signed)
   CC: Patient is complaining of right knee pain and chronic constipation.  HPI:  [name] is a 51 y.o. female with a past medical history of conditions listed below presenting to the clinic complaining of right knee pain and chronic constipation. Her other chronic medical conditions including hypertension, GERD, tobacco use, asthma, hyperlipidemia, and urinary incontinence were also discussed. Please see problem based charting for the status of the patient's current and chronic medical conditions.   Past Medical History:  Diagnosis Date  . Asthma, chronic 03/30/2012  . Essential hypertension, benign 03/30/2012  . Hypertension   . Seasonal allergies 03/30/2012  . Stress incontinence, female 03/30/2012  . Tobacco abuse 03/30/2012   Review of Systems:  Pertinent positives mentioned in HPI. Remainder of all ROS negative.   Physical Exam:  Vitals:   11/19/16 1318 11/19/16 1353  BP: (!) 144/78 (!) 122/56  Pulse: (!) 102 93  Temp: 98 F (36.7 C)   TempSrc: Oral   SpO2: 97%   Weight: 270 lb 1.6 oz (122.5 kg)   Height: 5\' 1"  (1.549 m)    Physical Exam  Constitutional: She is oriented to person, place, and time. No distress.  Morbidly obese  HENT:  Head: Normocephalic and atraumatic.  Mouth/Throat: Oropharynx is clear and moist.  Eyes: Right eye exhibits no discharge. Left eye exhibits no discharge.  Cardiovascular: Normal rate, regular rhythm and intact distal pulses.  Pulmonary/Chest: Effort normal and breath sounds normal. No respiratory distress. She has no wheezes. She has no rales.  Abdominal: Soft. Bowel sounds are normal. She exhibits no distension. There is no tenderness. There is no guarding.  Musculoskeletal: She exhibits no edema.  Right knee: No erythema, increased warmth, or edema. Normal range of motion. Positive crepitus.  Neurological: She is alert and oriented to person, place, and time.  Skin: Skin is warm and dry.  Psychiatric: Her behavior is normal.     Assessment & Plan:   See Encounters Tab for problem based charting.  Patient discussed with Dr. Cleda DaubE. Hoffman

## 2016-11-19 NOTE — Assessment & Plan Note (Addendum)
Influenza vaccine administered at this visit. She has been previously referred to GI for a screening colonoscopy but has been a no-show to the appointment. Discussed iFOBT testing and patient agreed.

## 2016-12-12 ENCOUNTER — Telehealth: Payer: Self-pay

## 2016-12-12 NOTE — Telephone Encounter (Signed)
Questions about [facility] med. Please call pt back.

## 2016-12-12 NOTE — Telephone Encounter (Signed)
Pt stated Dr Loney Lohathore told to stop taking [facility] ES pain reliever but was on the last AVS . Wants to know if she should continue this med or take something else? Tylenol 500 mg is on current med list. Thanks

## 2016-12-13 NOTE — Telephone Encounter (Signed)
I am not sure what the active ingredients in the [facility] pain reliever are. Please advise her to take Acetaminophen 1000 mg every 8 hours as needed for her knee pain. Thanks.

## 2016-12-16 NOTE — Telephone Encounter (Signed)
Pt called / in formed to take " Acetaminophen 1000 mg every 8 hours as needed for her knee pain." per Dr Loney Lohathore; voiced understanding. Also requesting refill on Symbicort - informed has 3 refills. [facility] - stated [facility] are working on it.

## 2017-01-09 ENCOUNTER — Other Ambulatory Visit: Payer: Self-pay | Admitting: Internal Medicine

## 2017-01-09 NOTE — Telephone Encounter (Signed)
cetirizine (ZYRTEC) 10 MG tablet, [facility] [address]

## 2017-01-09 NOTE — Telephone Encounter (Signed)
Next appt scheduled  02/25/17 with PCP. 

## 2017-02-04 ENCOUNTER — Other Ambulatory Visit: Payer: Self-pay | Admitting: *Deleted

## 2017-02-04 ENCOUNTER — Other Ambulatory Visit: Payer: Self-pay | Admitting: Internal Medicine

## 2017-02-04 DIAGNOSIS — J4530 Mild persistent asthma, uncomplicated: Secondary | ICD-10-CM

## 2017-02-04 NOTE — Telephone Encounter (Signed)
Patient is requesting refills Symbicort, pls [facility] [address]

## 2017-02-04 NOTE — Telephone Encounter (Signed)
(  Duplicate)-refill request has been sent to pcp and pending review.Criss AlvineGoldston, Hisham Provence Cassady1/22/201911:04 AM

## 2017-02-05 MED ORDER — BUDESONIDE-FORMOTEROL FUMARATE 160-4.5 MCG/ACT IN AERO: 2.0000 | INHALATION_SPRAY | Freq: Two times a day (BID) | RESPIRATORY_TRACT | 3 refills | Status: DC

## 2017-02-15 ENCOUNTER — Other Ambulatory Visit: Payer: Self-pay | Admitting: Internal Medicine

## 2017-02-15 DIAGNOSIS — K219 Gastro-esophageal reflux disease without esophagitis: Secondary | ICD-10-CM

## 2017-02-25 ENCOUNTER — Encounter: Payer: Medicaid Other | Admitting: Internal Medicine

## 2017-03-09 ENCOUNTER — Other Ambulatory Visit: Payer: Self-pay | Admitting: Internal Medicine

## 2017-03-11 ENCOUNTER — Encounter: Payer: Medicaid Other | Admitting: Internal Medicine

## 2017-03-18 ENCOUNTER — Encounter: Payer: Self-pay | Admitting: Internal Medicine

## 2017-03-18 ENCOUNTER — Other Ambulatory Visit: Payer: Self-pay

## 2017-03-18 ENCOUNTER — Ambulatory Visit: Payer: Medicaid Other | Admitting: Internal Medicine

## 2017-03-18 ENCOUNTER — Encounter (INDEPENDENT_AMBULATORY_CARE_PROVIDER_SITE_OTHER): Payer: Self-pay

## 2017-03-18 VITALS — BP 133/71 | HR 100 | Temp 98.1°F | Ht 61.0 in | Wt 273.8 lb

## 2017-03-18 DIAGNOSIS — I10 Essential (primary) hypertension: Secondary | ICD-10-CM

## 2017-03-18 DIAGNOSIS — M1711 Unilateral primary osteoarthritis, right knee: Secondary | ICD-10-CM

## 2017-03-18 DIAGNOSIS — G8929 Other chronic pain: Secondary | ICD-10-CM

## 2017-03-18 DIAGNOSIS — Z0000 Encounter for general adult medical examination without abnormal findings: Secondary | ICD-10-CM

## 2017-03-18 DIAGNOSIS — M25561 Pain in right knee: Secondary | ICD-10-CM

## 2017-03-18 DIAGNOSIS — Z79899 Other long term (current) drug therapy: Secondary | ICD-10-CM

## 2017-03-18 DIAGNOSIS — F17210 Nicotine dependence, cigarettes, uncomplicated: Secondary | ICD-10-CM | POA: Diagnosis not present

## 2017-03-18 NOTE — Patient Instructions (Addendum)
[name] it was nice seeing you today.  For right knee pain:  Take Tylenol 1000 mg every 8 hours as needed. Continue using Voltaren gel 4 times a day. An x-ray of your knee has been ordered. You have also been referred to physical therapy.

## 2017-03-19 NOTE — Assessment & Plan Note (Signed)
Currently down to 4 cigarettes/day.  Patient is not interested in pharmacotherapy and wants to continue cutting down on her own.  Plan -Reassess progress at next visit

## 2017-03-19 NOTE — Assessment & Plan Note (Signed)
Patient declines screening colonoscopy.  She is previously provided with an iFOBT card.  States she had mailed it back to the clinic but I was informed that we never received it.  She has been provided with another iFOBT card.

## 2017-03-19 NOTE — Assessment & Plan Note (Signed)
Blood pressure currently well controlled on amlodipine 10 mg daily and hydrochlorothiazide 25 mg daily.  Plan -Continue current regimen

## 2017-03-19 NOTE — Progress Notes (Signed)
   CC: Patient is here for a regular checkup.  Hypertension, chronic right knee pain, tobacco use, and preventative health care were discussed.  HPI:  [name] is a 52 y.o. female with a past medical history of conditions listed below presenting to the clinic for a regular checkup.  Hypertension, chronic right knee pain, tobacco use, and preventative health care were discussed.  Past Medical History:  Diagnosis Date  . Asthma, chronic 03/30/2012  . Essential hypertension, benign 03/30/2012  . Hypertension   . Seasonal allergies 03/30/2012  . Stress incontinence, female 03/30/2012  . Tobacco abuse 03/30/2012   Review of Systems:  Pertinent positives mentioned in HPI. Remainder of all ROS negative.   Physical Exam:  Vitals:   03/18/17 1340  BP: 133/71  Pulse: 100  Temp: 98.1 F (36.7 C)  TempSrc: Oral  SpO2: 100%  Weight: 273 lb 12.8 oz (124.2 kg)  Height: 5\' 1"  (1.549 m)   Physical Exam  Constitutional: She is oriented to person, place, and time. She appears well-developed and well-nourished. No distress.  HENT:  Head: Normocephalic and atraumatic.  Mouth/Throat: Oropharynx is clear and moist.  Eyes: Right eye exhibits no discharge. Left eye exhibits no discharge.  Cardiovascular: Normal rate, regular rhythm and intact distal pulses.  Pulmonary/Chest: Effort normal and breath sounds normal. No respiratory distress.  Abdominal: Soft. Bowel sounds are normal. She exhibits no distension. There is no tenderness.  Musculoskeletal: She exhibits no edema, tenderness or deformity.  Right knee: No erythema, increased warmth, or swelling.  Normal range of motion with crepitus.  Neurological: She is alert and oriented to person, place, and time.  Skin: Skin is warm and dry.    Assessment & Plan:   See Encounters Tab for problem based charting.  Patient discussed with Dr. Heide SparkNarendra

## 2017-03-20 NOTE — Assessment & Plan Note (Signed)
Patient continues to complain of chronic right knee pain at this visit.  Knee pain is likely secondary to osteoarthritis.  She has gained 57 pounds since October 2016.  States she is not able to stand for too long due to the pain.  She was previously advised to continue using Voltaren gel 4 times a day and take Tylenol 1000 mg every 8 hours as needed. She reports using Voltaren gel and taking a [facility] pain reliever.  She did not have the bottle with her and is not sure what the pain reliever contains.  Plan -Advised her to take Tylenol 1000 mg every 8 hours instead -Continue Voltaren gel -X-ray of right knee; no prior imaging on file  -Referral to physical therapy -Emphasized the importance of weight loss

## 2017-03-24 NOTE — Progress Notes (Signed)
Internal Medicine Clinic Attending  Case discussed with Dr. Rathoreat the time of the visit. We reviewed the resident's history and exam and pertinent patient test results. I agree with the assessment, diagnosis, and plan of care documented in the resident's note.  

## 2017-03-26 ENCOUNTER — Other Ambulatory Visit: Payer: Self-pay | Admitting: *Deleted

## 2017-03-27 ENCOUNTER — Encounter: Payer: Self-pay | Admitting: *Deleted

## 2017-03-27 MED ORDER — MONTELUKAST SODIUM 10 MG PO TABS: ORAL_TABLET | ORAL | 1 refills | Status: DC

## 2017-04-03 ENCOUNTER — Other Ambulatory Visit: Payer: Self-pay | Admitting: Internal Medicine

## 2017-04-04 ENCOUNTER — Telehealth: Payer: Self-pay | Admitting: Internal Medicine

## 2017-04-04 MED ORDER — MELOXICAM 7.5 MG PO TABS: 7.5000 mg | ORAL_TABLET | Freq: Every day | ORAL | 1 refills | Status: DC | PRN

## 2017-04-04 NOTE — Telephone Encounter (Signed)
Patient is requesting a refill on meloxicam

## 2017-04-04 NOTE — Telephone Encounter (Signed)
Has not been prescribed since 2018 Please advise

## 2017-04-04 NOTE — Telephone Encounter (Signed)
Medication has been refilled. Thanks.

## 2017-04-07 ENCOUNTER — Other Ambulatory Visit: Payer: Medicaid Other

## 2017-04-07 DIAGNOSIS — Z0000 Encounter for general adult medical examination without abnormal findings: Secondary | ICD-10-CM

## 2017-04-10 LAB — FECAL OCCULT BLOOD, IMMUNOCHEMICAL: FECAL OCCULT BLD: NEGATIVE

## 2017-04-28 NOTE — Addendum Note (Signed)
Addended by: Neomia DearPOWERS, Kaitlan Bin E on: 04/28/2017 02:41 PM   Modules accepted: Orders

## 2017-05-01 ENCOUNTER — Other Ambulatory Visit: Payer: Self-pay | Admitting: Internal Medicine

## 2017-05-02 ENCOUNTER — Other Ambulatory Visit: Payer: Self-pay | Admitting: Internal Medicine

## 2017-05-02 DIAGNOSIS — K5909 Other constipation: Secondary | ICD-10-CM

## 2017-05-12 ENCOUNTER — Other Ambulatory Visit: Payer: Self-pay | Admitting: Internal Medicine

## 2017-05-12 DIAGNOSIS — K219 Gastro-esophageal reflux disease without esophagitis: Secondary | ICD-10-CM

## 2017-06-01 ENCOUNTER — Other Ambulatory Visit: Payer: Self-pay | Admitting: Internal Medicine

## 2017-06-21 ENCOUNTER — Other Ambulatory Visit: Payer: Self-pay | Admitting: Internal Medicine

## 2017-06-21 DIAGNOSIS — I10 Essential (primary) hypertension: Secondary | ICD-10-CM

## 2017-06-21 DIAGNOSIS — K219 Gastro-esophageal reflux disease without esophagitis: Secondary | ICD-10-CM

## 2017-06-24 NOTE — Addendum Note (Signed)
Addended by: John GiovanniATHORE, Shanley Furlough on: 06/24/2017 02:13 PM   Modules accepted: Orders

## 2017-07-09 ENCOUNTER — Encounter: Payer: Self-pay | Admitting: *Deleted

## 2017-07-22 ENCOUNTER — Encounter: Payer: Self-pay | Admitting: Internal Medicine

## 2017-07-22 ENCOUNTER — Ambulatory Visit (HOSPITAL_COMMUNITY)
Admission: RE | Admit: 2017-07-22 | Discharge: 2017-07-22 | Disposition: A | Discharge status: Home / Self Care | Payer: Medicaid Other | Source: Ambulatory Visit | Attending: Internal Medicine | Admitting: Internal Medicine

## 2017-07-22 ENCOUNTER — Ambulatory Visit: Payer: Medicaid Other | Admitting: Internal Medicine

## 2017-07-22 VITALS — BP 130/65 | HR 86 | Temp 98.5°F | Wt 274.7 lb

## 2017-07-22 DIAGNOSIS — J449 Chronic obstructive pulmonary disease, unspecified: Secondary | ICD-10-CM

## 2017-07-22 DIAGNOSIS — G8929 Other chronic pain: Secondary | ICD-10-CM | POA: Diagnosis present

## 2017-07-22 DIAGNOSIS — R600 Localized edema: Secondary | ICD-10-CM

## 2017-07-22 DIAGNOSIS — E669 Obesity, unspecified: Secondary | ICD-10-CM | POA: Diagnosis not present

## 2017-07-22 DIAGNOSIS — Z6841 Body Mass Index (BMI) 40.0 and over, adult: Secondary | ICD-10-CM

## 2017-07-22 DIAGNOSIS — Z79899 Other long term (current) drug therapy: Secondary | ICD-10-CM

## 2017-07-22 DIAGNOSIS — I10 Essential (primary) hypertension: Secondary | ICD-10-CM

## 2017-07-22 DIAGNOSIS — M1711 Unilateral primary osteoarthritis, right knee: Secondary | ICD-10-CM | POA: Diagnosis not present

## 2017-07-22 DIAGNOSIS — R05 Cough: Secondary | ICD-10-CM

## 2017-07-22 DIAGNOSIS — F17210 Nicotine dependence, cigarettes, uncomplicated: Secondary | ICD-10-CM

## 2017-07-22 DIAGNOSIS — M25561 Pain in right knee: Secondary | ICD-10-CM

## 2017-07-22 DIAGNOSIS — R059 Cough, unspecified: Secondary | ICD-10-CM

## 2017-07-22 DIAGNOSIS — J4540 Moderate persistent asthma, uncomplicated: Secondary | ICD-10-CM

## 2017-07-22 LAB — POCT URINALYSIS DIPSTICK
BILIRUBIN UA: NEGATIVE
Blood, UA: NEGATIVE
GLUCOSE UA: NEGATIVE
Ketones, UA: NEGATIVE
NITRITE UA: NEGATIVE
PH UA: 7 (ref 5.0–8.0)
Protein, UA: NEGATIVE
Spec Grav, UA: 1.015 (ref 1.010–1.025)
Urobilinogen, UA: 0.2 E.U./dL

## 2017-07-22 MED ORDER — BENZONATATE 100 MG PO CAPS: 100.0000 mg | ORAL_CAPSULE | Freq: Three times a day (TID) | ORAL | 0 refills | Status: DC | PRN

## 2017-07-22 NOTE — Assessment & Plan Note (Addendum)
Assessment: Will continue current inhaler regimen for now given her recent cold and cough symptoms, but in the future she may benefit from the addition of an anticholinergic agent. Counseled patient about the link between smoking and lung disease.  Plan 1. Continue Spiriva 2 puffs BID and albuterol as needed.  2. Reassess symptoms in 6-8 weeks. If she continues to have cough and dyspnea, consider adding anticholinergic agent. 3. Consider smoking cessation.

## 2017-07-22 NOTE — Assessment & Plan Note (Addendum)
Assessment: This pain is likely osteoarthritis given her age, obese body habitus, benign exam, and description of pain worsening throughout the day with activity. Her treatment regimen needs to be streamlined as she is taking 3 different NSAIDs. She reports that aleve is her favorite of the NSAIDs.  Plan:  1. Obtain right knee xray today to evaluate for joint space disease.  2. Continue voltaren gel as needed. 3. Discontinue meloxicam and advil.  4. Alternate between tylenol (1,000 mg 2-3 times per day PRN) and aleve ( 1,000 mg 1-2 times per day PRN). 5. Low impact exercises like water aerobics or the stationary bike for weight loss and knee strengthening.

## 2017-07-22 NOTE — Assessment & Plan Note (Signed)
Assessment: CTAB on exam without any observed coughing. This cough is likely a prolonged cough after an acute viral upper respiratory infection. However, her asthma, COPD, and current tobacco use are likely contributing to this cough.   Plan: Take Tessalon Perles 100 mg up to 3 times per day as needed for cough.

## 2017-07-22 NOTE — Patient Instructions (Addendum)
For your right knee pain: get an Xray today. Alternate between aleve and tylenol for pain as needed. Do not take Advil or meloxicam because they are the same type of medication as aleve. Consider low-impact exercises like water aerobics or the stationary bike to promote weight loss and knee strengthening.   For your cough: You have been prescribed tessalon perles. You can take these up to 3 times per day as needed for cough. Continue taking your fluticasone nasal spray and inhalers as prescribed.  For your lower leg swelling: elevation and compression stockings should help with the swelling. We will check your kidney function today as well.

## 2017-07-22 NOTE — Assessment & Plan Note (Addendum)
Assessment: Pt has increased her daily cigarette count from 4 to 12 in the past few months, which she attributes to stress. She would like to wean herself back down and has nicotine patches at home that she finds somewhat helpful. She does not want any other pharmacotherapy or counseling for smoking cessation at this time.  Plan: reassess progress at next visit.

## 2017-07-22 NOTE — Assessment & Plan Note (Addendum)
Assessment: On exam, patient has 1+ pitting edema bilaterally of the lower extremities. No concern for DVT given symmetric swelling. DDx of swelling includes amlodipine side effect, venous insufficiency, or renal disease. Given patient's recent triple-NSAID use, will check renal function today with BMP and UA. UA resulted as negative for protein (rules out nephrotic syndromes).  Plan: 1. Symptomatic relief with leg elevation and compression stockings.  2. UA and BMP 3. If kidney function is normal and edema continues with distress to the patient, consider future switch from amlodipine to an ACE/ARB.

## 2017-07-22 NOTE — Assessment & Plan Note (Signed)
BP 130/65 today. Blood pressure currently well controlled on amlodipine 10 mg daily and hydrochlorothiazide 25 mg daily.  Plan -Continue current regimen

## 2017-07-22 NOTE — Progress Notes (Signed)
   CC: Patient is here for a regular checkup. Chronic right knee pain, cough, tobacco use, hypertension, and preventative health care were discussed.  HPI:  [name] is a 52 y.o. with a medical history of conditions listed below presenting to the clinic for a regular checkup. Chronic right knee pain, cough, tobacco use, hypertension, and preventative health care were discussed.   Past Medical History:  Diagnosis Date  . Asthma, chronic 03/30/2012  . Essential hypertension, benign 03/30/2012  . Hypertension   . Seasonal allergies 03/30/2012  . Stress incontinence, female 03/30/2012  . Tobacco abuse 03/30/2012   Review of Systems:  Pertinent positives mentioned in HPI. Remainder of all ROS negative.   Physical Exam:  Vitals:   07/22/17 1347 07/22/17 1431  BP: (!) 153/89 130/65  Pulse: 94 86  Temp: 98.5 F (36.9 C)   TempSrc: Oral   SpO2: 97%   Weight: 274 lb 11.2 oz (124.6 kg)    Physical Exam  Constitutional: She is oriented to person, place, and time. She appears well-developed and well-nourished. No distress.  HENT:  Head: Normocephalic and atraumatic.  Mouth/Throat: Oropharynx is clear and moist.  Eyes: PERRL, EOMI. Cardiovascular: Normal rate, regular rhythm and intact distal pulses.  Pulmonary/Chest: Effort normal and breath sounds normal. No respiratory distress.  Abdominal: Soft. Bowel sounds are normal. Abdomen is soft, non-distended, and non-tender. Musculoskeletal: She exhibits no edema, tenderness or deformity.  Right knee: No erythema, increased warmth, or swelling.  Normal range of motion without crepitus. Mild tenderness to palpation along the right lateral knee joint line. Normal gait. Extremities: 1+ pitting edema in bilateral lower extremities. Neurological: She is alert and oriented to person, place, and time.  Skin: Skin is warm and dry.     Assessment & Plan:   See Encounters Tab for problem based charting.   Patient seen with Dr.  Sandre Kittyaines Patient ID: Crystal Briggs, female   DOB: 05-16-1965, 52 y.o.   MRN: [id number]

## 2017-07-23 LAB — BASIC METABOLIC PANEL
BUN/Creatinine Ratio: 14 (ref 9–23)
BUN: 10 mg/dL (ref 6–24)
CO2: 30 mmol/L — AB (ref 20–29)
Calcium: 9.7 mg/dL (ref 8.7–10.2)
Chloride: 96 mmol/L (ref 96–106)
Creatinine, Ser: 0.7 mg/dL (ref 0.57–1.00)
GFR calc Af Amer: 115 mL/min/{1.73_m2} (ref >59)
GFR, EST NON AFRICAN AMERICAN: 100 mL/min/{1.73_m2} (ref >59)
GLUCOSE: 103 mg/dL — AB (ref 65–99)
POTASSIUM: 3.8 mmol/L (ref 3.5–5.2)
Sodium: 142 mmol/L (ref 134–144)

## 2017-07-24 ENCOUNTER — Telehealth: Payer: Self-pay | Admitting: Internal Medicine

## 2017-07-24 NOTE — Telephone Encounter (Signed)
Pt calling back about X-ray results.

## 2017-07-25 NOTE — Progress Notes (Signed)
Internal Medicine Clinic Attending  I saw and evaluated the patient.  I personally confirmed the key portions of the history and exam documented by Dr. Dorrell and I reviewed pertinent patient test results.  The assessment, diagnosis, and plan were formulated together and I agree with the documentation in the resident's note.  Alexander Raines, M.D., Ph.D.  

## 2017-07-28 ENCOUNTER — Other Ambulatory Visit: Payer: Self-pay | Admitting: Internal Medicine

## 2017-07-28 NOTE — Telephone Encounter (Signed)
Per last note, patient was supposed to stop taking this medication.  Will not refill.

## 2017-07-28 NOTE — Telephone Encounter (Signed)
She is to be using alternating tylenol and aleve.  Thanks

## 2017-07-29 ENCOUNTER — Telehealth: Payer: Self-pay | Admitting: Internal Medicine

## 2017-07-29 NOTE — Telephone Encounter (Signed)
Pt is calling about xray results, pt contact # [phone]

## 2017-07-29 NOTE — Telephone Encounter (Signed)
Pt is calling back to get the xray results. Please call pt back.

## 2017-08-01 NOTE — Telephone Encounter (Signed)
Dr. Avie Arenasorrell - -  Can you please call patient with results?   Thanks.  If you are not able to, please let me know and I can call her this afternoon.   Debe CoderEmily Mullen, MD

## 2017-08-01 NOTE — Telephone Encounter (Signed)
This request has been forwarded to PCP and Attending Pool today in separate phone encounter from 07/24/2017. Kinnie FeilL. Ducatte, RN, BSN

## 2017-08-04 ENCOUNTER — Telehealth: Payer: Self-pay | Admitting: Internal Medicine

## 2017-08-04 NOTE — Telephone Encounter (Signed)
Pt would like a call back about retaining fluid in her legs and changing her BP medications.

## 2017-08-04 NOTE — Telephone Encounter (Signed)
Called pt, legs cont to swell, using stockings, discouraged. appt wed 7/24 at 1345

## 2017-08-06 ENCOUNTER — Ambulatory Visit: Payer: Medicaid Other

## 2017-08-06 ENCOUNTER — Encounter: Payer: Self-pay | Admitting: Internal Medicine

## 2017-08-06 NOTE — Telephone Encounter (Signed)
[facility] will close this encounter

## 2017-08-07 ENCOUNTER — Other Ambulatory Visit: Payer: Self-pay | Admitting: Internal Medicine

## 2017-08-07 DIAGNOSIS — K219 Gastro-esophageal reflux disease without esophagitis: Secondary | ICD-10-CM

## 2017-08-09 ENCOUNTER — Other Ambulatory Visit: Payer: Self-pay | Admitting: Internal Medicine

## 2017-08-09 DIAGNOSIS — K219 Gastro-esophageal reflux disease without esophagitis: Secondary | ICD-10-CM

## 2017-08-11 ENCOUNTER — Other Ambulatory Visit: Payer: Self-pay | Admitting: Internal Medicine

## 2017-08-11 DIAGNOSIS — M25561 Pain in right knee: Secondary | ICD-10-CM

## 2017-08-16 ENCOUNTER — Other Ambulatory Visit: Payer: Self-pay | Admitting: Internal Medicine

## 2017-08-19 ENCOUNTER — Other Ambulatory Visit: Payer: Self-pay | Admitting: Internal Medicine

## 2017-08-19 ENCOUNTER — Telehealth: Payer: Self-pay | Admitting: Internal Medicine

## 2017-08-19 NOTE — Telephone Encounter (Signed)
Patient [facility]ill [facility] mel[facility]ican 7.5mg  [facility] [address], [phone]

## 2017-08-21 NOTE — Telephone Encounter (Signed)
Please advise reason for denial so triage can call pt, thank you

## 2017-08-22 ENCOUNTER — Other Ambulatory Visit: Payer: Self-pay | Admitting: Internal Medicine

## 2017-08-22 NOTE — Telephone Encounter (Signed)
Pt aware.Kingsley SpittleGoldston, Darlene Cassady8/9/20192:33 PM

## 2017-08-22 NOTE — Telephone Encounter (Signed)
From: Dionne Anoorrell, Deborah N, MD  Sent: 08/21/2017  8:56 PM EDT  To: [facility] Front Desk Pool  Subject: Meloxicam refill                 Please advise patient that we will not refill her meloxicam because we discontinued this medication at her last visit. We stopped it because she was taking other OTC NSAIDs and preferred to use Aleve.   Thanks!  Reece Levyeb

## 2017-09-02 ENCOUNTER — Other Ambulatory Visit: Payer: Self-pay | Admitting: *Deleted

## 2017-09-02 MED ORDER — ASPIRIN EC 81 MG PO TBEC: 81.0000 mg | DELAYED_RELEASE_TABLET | Freq: Every day | ORAL | 3 refills | Status: DC

## 2017-09-08 ENCOUNTER — Other Ambulatory Visit: Payer: Self-pay | Admitting: Internal Medicine

## 2017-09-08 MED ORDER — CETIRIZINE HCL 10 MG PO TABS: 10.0000 mg | ORAL_TABLET | Freq: Every day | ORAL | 0 refills | Status: DC

## 2017-09-08 NOTE — Telephone Encounter (Signed)
Needs refills cetirizine ([facility]YRTEC) [facility] [address]; pt contact [phone]

## 2017-09-25 ENCOUNTER — Other Ambulatory Visit: Payer: Self-pay | Admitting: *Deleted

## 2017-09-25 MED ORDER — MONTELUKAST SODIUM 10 MG PO TABS: ORAL_TABLET | ORAL | 3 refills | Status: DC

## 2017-10-14 ENCOUNTER — Other Ambulatory Visit: Payer: Self-pay | Admitting: Internal Medicine

## 2017-10-14 DIAGNOSIS — Z1231 Encounter for screening mammogram for malignant neoplasm of breast: Secondary | ICD-10-CM

## 2017-11-03 ENCOUNTER — Other Ambulatory Visit: Payer: Self-pay | Admitting: Internal Medicine

## 2017-11-03 DIAGNOSIS — K219 Gastro-esophageal reflux disease without esophagitis: Secondary | ICD-10-CM

## 2017-11-04 MED ORDER — PANTOPRAZOLE SODIUM 40 MG PO TBEC: DELAYED_RELEASE_TABLET | ORAL | 3 refills | Status: DC

## 2017-11-13 ENCOUNTER — Ambulatory Visit: Payer: Medicaid Other

## 2017-12-05 ENCOUNTER — Other Ambulatory Visit: Payer: Self-pay | Admitting: Internal Medicine

## 2017-12-08 ENCOUNTER — Telehealth: Payer: Self-pay | Admitting: *Deleted

## 2017-12-08 NOTE — Telephone Encounter (Signed)
[facility] for Diclofenac Sodium Gel 1%.  Approved x 1 month.  PA [id number].  [id number].  Angelina OkGladys , RN [facility] and notified of.  Angelina OkGladys , RN 12/08/2017 9:33 AM.

## 2017-12-24 ENCOUNTER — Inpatient Hospital Stay: Admission: RE | Admit: 2017-12-24 | Payer: Medicaid Other | Source: Ambulatory Visit

## 2017-12-30 ENCOUNTER — Encounter: Payer: Medicaid Other | Admitting: Internal Medicine

## 2018-01-27 ENCOUNTER — Encounter: Payer: Self-pay | Admitting: Internal Medicine

## 2018-02-16 NOTE — Progress Notes (Signed)
   CC: HTN and asthma f/u  HPI:   [name] is a 53 y.o. with a medical history of asthma, HTN, seasonal allergies, stress incontinence, and tobacco abuse who presents to the internal medicine clinic for HTN and asthma follow-up. She also has an acute concern of dry mouth. Please see problem based charting for the history and status of the patient's current and chronic medical conditions.   Past Medical History:  Diagnosis Date  . Asthma, chronic 03/30/2012  . Essential hypertension, benign 03/30/2012  . Hypertension   . Seasonal allergies 03/30/2012  . Stress incontinence, female 03/30/2012  . Tobacco abuse 03/30/2012    Review of Systems:   Pertinent positives mentioned in HPI. Remainder of all ROS negative.  Physical Exam: Vitals:   02/17/18 1337 02/17/18 1339  BP:  120/61  Pulse:  93  Temp:  98.4 F (36.9 C)  TempSrc:  Oral  SpO2:  96%  Weight: 262 lb 3.2 oz (118.9 kg)   Height: 5\' 1"  (1.549 m)    Physical Exam  Constitutional: Obese woman. No distress. HEENT: Pupils are equal, round, and reactive to light. EOM are normal. Normal oropharynx without thrush. Cardiovascular: Normal rate and regular rhythm. No murmurs, rubs, or gallops. Pulmonary/Chest: Effort normal. Clear to auscultation bilaterally. No wheezes, rales, or rhonchi. Abdominal: Bowel sounds present. Soft, non-distended, non-tender. Ext: No lower extremity edema. Skin: Warm and dry. No rashes or wounds.   Assessment & Plan:   See Encounters Tab for problem based charting.  Patient discussed with Dr. Josem Kaufmann

## 2018-02-17 ENCOUNTER — Encounter: Payer: Self-pay | Admitting: Internal Medicine

## 2018-02-17 ENCOUNTER — Other Ambulatory Visit: Payer: Self-pay

## 2018-02-17 ENCOUNTER — Other Ambulatory Visit: Payer: Self-pay | Admitting: *Deleted

## 2018-02-17 ENCOUNTER — Ambulatory Visit: Payer: Medicaid Other | Admitting: Internal Medicine

## 2018-02-17 VITALS — BP 120/61 | HR 93 | Temp 98.4°F | Ht 61.0 in | Wt 262.2 lb

## 2018-02-17 DIAGNOSIS — R600 Localized edema: Secondary | ICD-10-CM | POA: Diagnosis not present

## 2018-02-17 DIAGNOSIS — N393 Stress incontinence (female) (male): Secondary | ICD-10-CM

## 2018-02-17 DIAGNOSIS — K219 Gastro-esophageal reflux disease without esophagitis: Secondary | ICD-10-CM

## 2018-02-17 DIAGNOSIS — Z79899 Other long term (current) drug therapy: Secondary | ICD-10-CM

## 2018-02-17 DIAGNOSIS — I10 Essential (primary) hypertension: Secondary | ICD-10-CM | POA: Diagnosis not present

## 2018-02-17 DIAGNOSIS — Z7951 Long term (current) use of inhaled steroids: Secondary | ICD-10-CM

## 2018-02-17 DIAGNOSIS — E119 Type 2 diabetes mellitus without complications: Secondary | ICD-10-CM | POA: Insufficient documentation

## 2018-02-17 DIAGNOSIS — E1165 Type 2 diabetes mellitus with hyperglycemia: Secondary | ICD-10-CM | POA: Diagnosis not present

## 2018-02-17 DIAGNOSIS — J4540 Moderate persistent asthma, uncomplicated: Secondary | ICD-10-CM | POA: Diagnosis not present

## 2018-02-17 DIAGNOSIS — Z23 Encounter for immunization: Secondary | ICD-10-CM | POA: Diagnosis not present

## 2018-02-17 DIAGNOSIS — R682 Dry mouth, unspecified: Secondary | ICD-10-CM | POA: Diagnosis not present

## 2018-02-17 DIAGNOSIS — E785 Hyperlipidemia, unspecified: Secondary | ICD-10-CM

## 2018-02-17 DIAGNOSIS — M1711 Unilateral primary osteoarthritis, right knee: Secondary | ICD-10-CM | POA: Diagnosis not present

## 2018-02-17 LAB — POCT GLYCOSYLATED HEMOGLOBIN (HGB A1C): Hemoglobin A1C: 7.4 % — AB (ref 4.0–5.6)

## 2018-02-17 LAB — GLUCOSE, CAPILLARY: Glucose-Capillary: 117 mg/dL — ABNORMAL HIGH (ref 70–99)

## 2018-02-17 MED ORDER — FAMOTIDINE 20 MG PO TABS: 20.0000 mg | ORAL_TABLET | Freq: Every day | ORAL | 1 refills | Status: DC

## 2018-02-17 MED ORDER — OXYBUTYNIN CHLORIDE ER 10 MG PO TB24: 5.0000 mg | ORAL_TABLET | Freq: Every day | ORAL | 0 refills | Status: DC

## 2018-02-17 MED ORDER — ATORVASTATIN CALCIUM 40 MG PO TABS: 40.0000 mg | ORAL_TABLET | Freq: Every day | ORAL | 3 refills | Status: DC

## 2018-02-17 NOTE — Assessment & Plan Note (Addendum)
Right knee xray in August showed mild OA with peripheral spurring. Patient reports that her right knee pain has improved. She uses voltaren gel, which helps with the pain. She is no longer taking any tylenol or nsaids for this problem.   Plan - Continue voltaren gel PRN

## 2018-02-17 NOTE — Assessment & Plan Note (Signed)
BP 120/61. Well controlled on current regimen.  Plan - Continue amlodipine 10mg  daily - Continue hctz 25mg  daily

## 2018-02-17 NOTE — Progress Notes (Signed)
Case discussed with Dr. Dorrell at the time of the visit. We reviewed the resident's history and exam and pertinent patient test results. I agree with the assessment, diagnosis, and plan of care documented in the resident's note. 

## 2018-02-17 NOTE — Assessment & Plan Note (Signed)
Patient reports there lower extremity swelling has resolved without any intervention. There is no edema on exam today.

## 2018-02-17 NOTE — Assessment & Plan Note (Signed)
Ms. Langland's Hb A1c today was 7.4. It was last 5.4 in 2016. Her only symptom is dry mouth. She denies polyuria, polydipsia, and polyphagia. She denies UA in July was without glucosuria. Her CBG in July and today were both < 126. She has a strong family hx of DMII, including her mother and sister (who is on insulin). Her risk factors include obesity. This new diagnosis was discussed with the patient at length. She reports that she is not surprised by the diagnosis given her family history and weight. Treatment options were also discussed with the patient. At this preliminary stage, I advised lifestyle modifications and weight loss vs metformin. She would like to attempt lifestyle changes first. We spoke about limiting her sugar and starch intake. She agreed to visit with our diabetes educator Lupita Leash to learn more about the changes she can make to improve her health and hopefully avoid medications for her diabetes.   Plan 1. Lifestyle modifications 2. Referral to diabetes education with Lupita Leash 3. F/u in 3 months for repeat A1c

## 2018-02-17 NOTE — Assessment & Plan Note (Signed)
Patient is currently taking Symbicort 2 puffs BID. She reports that even with the Symbicort, she uses her albuterol 1-3x per day depending on how often she exerts herself. She states that she wheezes when she works around the house and the albuterol helps. The patient was able to demonstrate appropriate inhaler technique, however she does not use a spacer. She would like to try a spacer with her Symbicort before adding on a second maintenance inhaler, such as a LAMA.   Plan - Patient was provided with a spacer to optimize medication delivery  - She will follow-up in 3 months for consideration of adding a second maintenance inhaler

## 2018-02-17 NOTE — Assessment & Plan Note (Addendum)
HPI: Patient reports that she has been waking up during the night and in the morning with a dry throat. This has been going on for about 3 months. She denies polydipsia, polyphagia, or polyuria. She denies ocular dryness. She states that it doesn't bother her much and it goes away when she drinks water.   Assessment: No thrush on HEENT exam. A1c is 7.4. Ddx includes mouth breathing while asleep, dry winter environment, inhaler side effect, oxybutynin side effect, and DM. Sjogren's syndrome is possible, but there are better explanations for her dry mouth at this time than an autoimmune disorder. I explained the differential to the patient.   Plan - See diabetes assessment - If dry mouth continues to bother her, will discuss discontinuing oxybutynin at her next visit

## 2018-02-17 NOTE — Patient Instructions (Signed)
It was a pleasure seeing you today, Ms. Pflieger!  1. Your blood pressure is very well-controlled. Keep taking your medications as prescribed. Great job!  2. Your asthma is still uncontrolled - Start using the spacer when you take your Symbicort  - Please come back to see me in 3 months or sooner if you are still requiring your albuterol inhaler 1-2x per day  3. Your dry mouth is likely a medication side effect - If it begins to bother you, please make an appointment to discuss stopping your oxybutynin - We will get an A1c today as well to rule out diabetes.  Please follow-up in 3 months. If you are no longer using your albuterol daily, you can cancel that appointment and come back in 6 months.  Feel free to call our clinic at [phone] if you have any questions.  Thanks! Dr. Avie Arenas

## 2018-02-17 NOTE — Assessment & Plan Note (Signed)
Currently taking oxybutynin 5mg  daily. Followed by urology.

## 2018-02-18 ENCOUNTER — Other Ambulatory Visit: Payer: Self-pay | Admitting: *Deleted

## 2018-02-18 DIAGNOSIS — J4530 Mild persistent asthma, uncomplicated: Secondary | ICD-10-CM

## 2018-02-18 MED ORDER — ALBUTEROL SULFATE HFA 108 (90 BASE) MCG/ACT IN AERS: 1.0000 | INHALATION_SPRAY | Freq: Four times a day (QID) | RESPIRATORY_TRACT | 3 refills | Status: DC | PRN

## 2018-02-19 ENCOUNTER — Telehealth: Payer: Self-pay | Admitting: *Deleted

## 2018-02-19 MED ORDER — BUDESONIDE-FORMOTEROL FUMARATE 160-4.5 MCG/ACT IN AERO: 2.0000 | INHALATION_SPRAY | Freq: Two times a day (BID) | RESPIRATORY_TRACT | 3 refills | Status: DC

## 2018-02-19 NOTE — Telephone Encounter (Signed)
PA request for Diclofenac Gel 1% was called to [facility].  Approved 02/19/2018 thru 03/21/2018.  PA [id number].  I [id number].  [facility] [address] was called and notified of the approval.  Spoke with [place].  Angelina Ok, RN 02/19/2018.  11:45 AM.

## 2018-03-06 ENCOUNTER — Encounter: Payer: Medicaid Other | Admitting: Dietician

## 2018-03-09 ENCOUNTER — Telehealth: Payer: Self-pay | Admitting: Internal Medicine

## 2018-03-09 NOTE — Telephone Encounter (Signed)
TC placed to pt to inquire if RN could assist pt.  Pt states she had office visit on 2/4 with Dr. Avie Arenas and c/o of vaginal discharge with odor and itching and never had RX called into her pharmacy to treat this.  She is requesting a RX.  Nothing noted on AVS. Will forward to PCP. SChaplin, RN,BSN

## 2018-03-09 NOTE — Telephone Encounter (Signed)
Pt would like Dr Avie Arenas to give her a call [phone]

## 2018-03-10 NOTE — Telephone Encounter (Signed)
Requesting to speak with a nurse. Please call pt back.  

## 2018-03-10 NOTE — Telephone Encounter (Signed)
Pt calls and is upset, saying she is not being treated appropriately in the clinic. She states she has had this problem for a long time and no one does anything, it started when she had a miscarriage a few years ago and since then she has this problem. She states no one explains to her they just do things to her and send her on. appt in [facility] made. Pt states she told dr dorrell and wanted to talk to her about it but was brushed off as usual

## 2018-03-11 ENCOUNTER — Ambulatory Visit: Payer: Medicaid Other

## 2018-03-13 ENCOUNTER — Other Ambulatory Visit: Payer: Self-pay | Admitting: Internal Medicine

## 2018-03-13 ENCOUNTER — Other Ambulatory Visit (HOSPITAL_COMMUNITY)
Admission: RE | Admit: 2018-03-13 | Discharge: 2018-03-13 | Disposition: A | Discharge status: Home / Self Care | Payer: Medicaid Other | Source: Ambulatory Visit | Attending: Internal Medicine | Admitting: Internal Medicine

## 2018-03-13 ENCOUNTER — Ambulatory Visit: Payer: Medicaid Other | Admitting: Internal Medicine

## 2018-03-13 ENCOUNTER — Encounter: Payer: Self-pay | Admitting: Internal Medicine

## 2018-03-13 VITALS — BP 144/85 | HR 89 | Temp 98.3°F | Ht 61.0 in | Wt 258.3 lb

## 2018-03-13 DIAGNOSIS — N898 Other specified noninflammatory disorders of vagina: Secondary | ICD-10-CM | POA: Insufficient documentation

## 2018-03-13 DIAGNOSIS — N760 Acute vaginitis: Secondary | ICD-10-CM | POA: Diagnosis not present

## 2018-03-13 NOTE — Patient Instructions (Signed)
Ms. Pirozzi,  We did a Pap smear today as well as a vaginal exam to check for infection as a cause of your discharge.  The results should be in within the next few days. I will give you a call to let you know the results and if you need treatment.  If symptoms worsen give [place] a call and let us know or if you have any other questions or concerns.  Evelene Croon

## 2018-03-14 ENCOUNTER — Encounter: Payer: Self-pay | Admitting: Internal Medicine

## 2018-03-14 NOTE — Assessment & Plan Note (Addendum)
[name] presents with months of white vaginal discharge associated with itching and bad vaginal odor. She denies urinary symptoms, bleeding, and has not been sexually active for 2.5 years. Has never been diagnosed with a STI. Last PAP smear done in 11/2013 with no evidence of malignancy.  On exam she has a milky, white discharge in vaginal canal and cervix of a strawberry appearance to it.  No other abnormalities noted.  Cervicalancillary swab collected (BV, yeast, trich, GC/chlamydias) and Pap smear done. She declined HIV and RPR.   ADDENDUM: Vaginal swab was positive for BV. Will send Flagyl 500 mg BID x 7 days.

## 2018-03-14 NOTE — Progress Notes (Signed)
   CC: Vaginal discharge   HPI:  [name] is a 53 y.o. year-old female with PMH listed below who presents to clinic for vaginal discharge. Please see problem based assessment and plan for further details.   Past Medical History:  Diagnosis Date  . Asthma, chronic 03/30/2012  . Essential hypertension, benign 03/30/2012  . Hypertension   . Seasonal allergies 03/30/2012  . Stress incontinence, female 03/30/2012  . Tobacco abuse 03/30/2012   Review of Systems:   Review of Systems  Constitutional: Negative for chills and fever.  Genitourinary: Negative for dysuria, frequency, hematuria and urgency.       Vaginal discharge and itching    Physical Exam: Vitals:   03/13/18 0942  BP: (!) 144/85  Pulse: 89  Temp: 98.3 F (36.8 C)  TempSrc: Oral  SpO2: 96%  Weight: 258 lb 4.8 oz (117.2 kg)  Height: 5\' 1"  (1.549 m)   General: well-appearing female in NAD  GU: External genitalia are grossly normal. On pelvic exam, there is a milky white discharge in the vaginal canal. Cervix has a strawberry appearance to it and is friable. No lesions noted.    Assessment & Plan:   See Encounters Tab for problem based charting.  Patient discussed with Dr. Criselda Peaches

## 2018-03-16 ENCOUNTER — Telehealth: Payer: Self-pay

## 2018-03-16 ENCOUNTER — Other Ambulatory Visit: Payer: Self-pay | Admitting: Internal Medicine

## 2018-03-16 LAB — CYTOLOGY - PAP
Adequacy: ABSENT
Diagnosis: NEGATIVE
HPV: DETECTED — AB

## 2018-03-16 NOTE — Telephone Encounter (Signed)
Returned call to patient. She states she had a missed call from [facility] one hour after speaking to triage nurse regarding refill. No additional documentation in chart. Will forward to provider who saw patient on 03/13/2018. Kinnie Feil, RN, BSN

## 2018-03-16 NOTE — Progress Notes (Signed)
Internal Medicine Clinic Attending  Case discussed with Dr. Santos at the time of the visit.  We reviewed the resident's history and exam and pertinent patient test results.  I agree with the assessment, diagnosis, and plan of care documented in the resident's note.    

## 2018-03-16 NOTE — Addendum Note (Signed)
Addended by: Debe Coder B on: 03/16/2018 08:58 AM   Modules accepted: Level of Service

## 2018-03-16 NOTE — Telephone Encounter (Signed)
Needs refill on cetirizine (ZYRTEC) 10 MG tablet [id number] [place], [place] - [address] ,pt contact [phone]

## 2018-03-16 NOTE — Telephone Encounter (Signed)
I have not, but I believe Dr. Evelene Croon has with regards to her [facility] visit results.

## 2018-03-16 NOTE — Telephone Encounter (Signed)
Pt called / informed Cetrizine was refilled today [facility]. Stated she will call the pharmacy.

## 2018-03-16 NOTE — Telephone Encounter (Signed)
Pt is returning a call. Please call back.  

## 2018-03-17 ENCOUNTER — Other Ambulatory Visit: Payer: Self-pay | Admitting: Internal Medicine

## 2018-03-17 DIAGNOSIS — B977 Papillomavirus as the cause of diseases classified elsewhere: Secondary | ICD-10-CM

## 2018-03-17 LAB — CERVICOVAGINAL ANCILLARY ONLY
Bacterial vaginitis: POSITIVE — AB
Candida vaginitis: POSITIVE — AB
Chlamydia: NEGATIVE
Neisseria Gonorrhea: NEGATIVE
Trichomonas: POSITIVE — AB

## 2018-03-17 MED ORDER — FLUCONAZOLE 150 MG PO TABS: ORAL_TABLET | ORAL | 0 refills | Status: DC

## 2018-03-17 MED ORDER — METRONIDAZOLE 500 MG PO TABS: 500.0000 mg | ORAL_TABLET | Freq: Two times a day (BID) | ORAL | 0 refills | Status: AC

## 2018-03-17 NOTE — Telephone Encounter (Signed)
I left a result note yesterday with all the information. 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 I will call her again today to discuss results and further management. Thank you!

## 2018-03-17 NOTE — Telephone Encounter (Signed)
I called patient today and discussed results.

## 2018-03-30 ENCOUNTER — Other Ambulatory Visit: Payer: Self-pay | Admitting: Internal Medicine

## 2018-03-30 DIAGNOSIS — R682 Dry mouth, unspecified: Secondary | ICD-10-CM

## 2018-03-30 DIAGNOSIS — N393 Stress incontinence (female) (male): Secondary | ICD-10-CM

## 2018-03-30 MED ORDER — OXYBUTYNIN CHLORIDE ER 5 MG PO TB24: 5.0000 mg | ORAL_TABLET | Freq: Every day | ORAL | 2 refills | Status: DC

## 2018-03-30 NOTE — Telephone Encounter (Signed)
Needs refill on oxybutynin (DITROPAN XL) 10 MG 24 hr tablet  [id number] - [place], [place] - [address] ;pt contact [phone]

## 2018-04-02 ENCOUNTER — Telehealth: Payer: Self-pay | Admitting: *Deleted

## 2018-04-02 NOTE — Telephone Encounter (Signed)
Called patient lvm for patient to call the [facility] to schedule her appointment @ [phone].

## 2018-04-03 ENCOUNTER — Encounter: Payer: Self-pay | Admitting: *Deleted

## 2018-04-10 ENCOUNTER — Other Ambulatory Visit: Payer: Self-pay | Admitting: Internal Medicine

## 2018-04-20 ENCOUNTER — Other Ambulatory Visit: Payer: Self-pay

## 2018-04-20 DIAGNOSIS — N393 Stress incontinence (female) (male): Secondary | ICD-10-CM

## 2018-04-20 DIAGNOSIS — R682 Dry mouth, unspecified: Secondary | ICD-10-CM

## 2018-04-20 MED ORDER — OXYBUTYNIN CHLORIDE ER 5 MG PO TB24: 5.0000 mg | ORAL_TABLET | Freq: Two times a day (BID) | ORAL | 2 refills | Status: DC

## 2018-04-20 NOTE — Telephone Encounter (Signed)
Called pt - informed Ditropan was refilled on 3/ 16 x 2 RF. Stated she did not realized of the instruction to be taken "once a day at bedtime". Stated she has been taking it twice a day; now she's out and the pharmacy told her it's too soon to fill. And stated she needs it twice a day.  Unless changed, pt would have to pay out of pocket until next refill. Thanks

## 2018-04-20 NOTE — Telephone Encounter (Signed)
oxybutynin (DITROPAN-XL) 5 MG 24 hr tablet, refill request @  [facility] [phone] [place], [place] - [address] AT [place] [address] [phone] (Phone) [phone] (Fax)   Would like a call about the refill.

## 2018-05-04 ENCOUNTER — Other Ambulatory Visit: Payer: Self-pay | Admitting: *Deleted

## 2018-05-04 MED ORDER — AMLODIPINE BESYLATE 10 MG PO TABS: 10.0000 mg | ORAL_TABLET | Freq: Every day | ORAL | 3 refills | Status: DC

## 2018-05-17 NOTE — Progress Notes (Signed)
   This is a telephone encounter between Charleston Ropes and me for a healthcare maintenance visit. The visit was conducted with the patient located at home and me at [facility]. The patient's identity was confirmed using their DOB and current address. The patient has consented to being evaluated through a telephone encounter and understands the associated risks/benefits. I personally spent 7 minutes on medical discussion.   HPI:   [name] is a 53 y.o. female with the medical conditions listed below. We discussed her DMII, HTN, and asthma. She had no acute concerns. Please see problem based charting for the history and status of the patient's current and chronic medical conditions.  Past Medical History:  Diagnosis Date  . Asthma, chronic 03/30/2012  . Essential hypertension, benign 03/30/2012  . Hypertension   . Seasonal allergies 03/30/2012  . Stress incontinence, female 03/30/2012  . Tobacco abuse 03/30/2012    Review of Systems:   Pertinent positives mentioned in HPI. Remainder of all ROS negative.   Assessment & Plan:   Patient discussed with Dr. Heide Spark

## 2018-05-19 ENCOUNTER — Ambulatory Visit (INDEPENDENT_AMBULATORY_CARE_PROVIDER_SITE_OTHER): Payer: Medicaid Other | Admitting: Internal Medicine

## 2018-05-19 ENCOUNTER — Other Ambulatory Visit: Payer: Self-pay

## 2018-05-19 DIAGNOSIS — J4540 Moderate persistent asthma, uncomplicated: Secondary | ICD-10-CM

## 2018-05-19 DIAGNOSIS — I10 Essential (primary) hypertension: Secondary | ICD-10-CM | POA: Diagnosis not present

## 2018-05-19 DIAGNOSIS — E1165 Type 2 diabetes mellitus with hyperglycemia: Secondary | ICD-10-CM

## 2018-05-19 DIAGNOSIS — R682 Dry mouth, unspecified: Secondary | ICD-10-CM | POA: Diagnosis not present

## 2018-05-19 MED ORDER — FLUTICASONE-UMECLIDIN-VILANT 100-62.5-25 MCG/INH IN AEPB: 1.0000 | INHALATION_SPRAY | Freq: Every day | RESPIRATORY_TRACT | 2 refills | Status: DC

## 2018-05-19 NOTE — Assessment & Plan Note (Addendum)
Crystal Briggs reports she's been using her spacer with her Symbicort everyday. She thinks her dyspnea has improved, however, she continues to require her albuterol inhaler at least once a day. She would like to try an additional inhaler for improved maintenance therapy at this time.   Assessment: Will add LAMA to current therapy with triple inhaler.  Plan - Discontinue Symbicort. Start Trelegy.

## 2018-05-19 NOTE — Assessment & Plan Note (Signed)
Continues on amlodipine 10mg  daily and hctz 25mg  daily. Denies chest pain, dyspnea, or leg swelling. She has been well-controlled on this regimen at past visits. No refills needed.  Plan - Continue amlodipine 10mg  daily and hctz 25mg  daily

## 2018-05-20 ENCOUNTER — Encounter: Payer: Self-pay | Admitting: Internal Medicine

## 2018-05-20 NOTE — Assessment & Plan Note (Signed)
A1c 7.4 three months ago. Crystal Briggs reports that she has been eating less and feels that her clothes fit looser. She does not have a scale to weigh herself at home. She does not take her blood sugars. She denies polydipsia, polyuria, and polyphagia.   Plan - Continue lifestyle modifications - Clinic visit in 3 months for A1c

## 2018-05-20 NOTE — Progress Notes (Signed)
Internal Medicine Clinic Attending  Case discussed with Dr. Dorrell at the time of the visit.  We reviewed the resident's history and exam and pertinent patient test results.  I agree with the assessment, diagnosis, and plan of care documented in the resident's note.    

## 2018-05-20 NOTE — Assessment & Plan Note (Signed)
Symptoms have resolved.

## 2018-05-21 ENCOUNTER — Telehealth: Payer: Self-pay | Admitting: *Deleted

## 2018-05-21 NOTE — Telephone Encounter (Addendum)
Information for PA for Trelogy was sent to [facility] awaiting determination.  Conf #  [id number] Nash Dimmer, RN 05/21/2018 11:54 AM.  [employer] Trelegy.  Patient will need to try and fail Dulera and   Advair.  Message to Dr. Mcarthur Rossetti about .  Angelina Ok, RN [phone] 11:54 AM.

## 2018-05-21 NOTE — Telephone Encounter (Signed)
Call to [employer] for PA for Diclofenac 1% Gel.  Information was given.  Approved 05/21/2018 thru 06/21/2018.  PA [id number].   Angelina Ok, RN 05/21/2018 12:09 PM.

## 2018-06-10 ENCOUNTER — Other Ambulatory Visit: Payer: Self-pay | Admitting: Internal Medicine

## 2018-06-11 ENCOUNTER — Other Ambulatory Visit: Payer: Self-pay | Admitting: *Deleted

## 2018-06-11 DIAGNOSIS — I10 Essential (primary) hypertension: Secondary | ICD-10-CM

## 2018-06-12 ENCOUNTER — Ambulatory Visit: Payer: Medicaid Other | Admitting: Obstetrics and Gynecology

## 2018-06-15 MED ORDER — HYDROCHLOROTHIAZIDE 25 MG PO TABS: 25.0000 mg | ORAL_TABLET | Freq: Every day | ORAL | 3 refills | Status: DC

## 2018-07-05 ENCOUNTER — Encounter: Payer: Self-pay | Admitting: *Deleted

## 2018-07-13 ENCOUNTER — Other Ambulatory Visit: Payer: Self-pay | Admitting: *Deleted

## 2018-07-13 DIAGNOSIS — N393 Stress incontinence (female) (male): Secondary | ICD-10-CM

## 2018-07-13 DIAGNOSIS — R682 Dry mouth, unspecified: Secondary | ICD-10-CM

## 2018-07-13 MED ORDER — OXYBUTYNIN CHLORIDE ER 5 MG PO TB24: 5.0000 mg | ORAL_TABLET | Freq: Two times a day (BID) | ORAL | 2 refills | Status: DC

## 2018-07-28 ENCOUNTER — Encounter: Payer: Self-pay | Admitting: Dietician

## 2018-07-28 ENCOUNTER — Ambulatory Visit: Payer: Medicaid Other | Admitting: Internal Medicine

## 2018-07-28 ENCOUNTER — Ambulatory Visit: Payer: Medicaid Other | Admitting: Dietician

## 2018-07-28 ENCOUNTER — Encounter: Payer: Self-pay | Admitting: Internal Medicine

## 2018-07-28 ENCOUNTER — Other Ambulatory Visit: Payer: Self-pay

## 2018-07-28 VITALS — BP 130/67 | HR 96 | Temp 99.0°F | Ht 61.0 in | Wt 249.7 lb

## 2018-07-28 DIAGNOSIS — E118 Type 2 diabetes mellitus with unspecified complications: Secondary | ICD-10-CM | POA: Diagnosis not present

## 2018-07-28 DIAGNOSIS — G8929 Other chronic pain: Secondary | ICD-10-CM | POA: Diagnosis not present

## 2018-07-28 DIAGNOSIS — Z9111 Patient's noncompliance with dietary regimen: Secondary | ICD-10-CM | POA: Diagnosis not present

## 2018-07-28 DIAGNOSIS — Z79899 Other long term (current) drug therapy: Secondary | ICD-10-CM | POA: Diagnosis not present

## 2018-07-28 DIAGNOSIS — N393 Stress incontinence (female) (male): Secondary | ICD-10-CM

## 2018-07-28 DIAGNOSIS — J45909 Unspecified asthma, uncomplicated: Secondary | ICD-10-CM

## 2018-07-28 DIAGNOSIS — I10 Essential (primary) hypertension: Secondary | ICD-10-CM

## 2018-07-28 DIAGNOSIS — Z8742 Personal history of other diseases of the female genital tract: Secondary | ICD-10-CM

## 2018-07-28 DIAGNOSIS — M1711 Unilateral primary osteoarthritis, right knee: Secondary | ICD-10-CM | POA: Diagnosis not present

## 2018-07-28 DIAGNOSIS — Z7952 Long term (current) use of systemic steroids: Secondary | ICD-10-CM

## 2018-07-28 DIAGNOSIS — K5909 Other constipation: Secondary | ICD-10-CM

## 2018-07-28 DIAGNOSIS — M545 Low back pain: Secondary | ICD-10-CM | POA: Diagnosis not present

## 2018-07-28 DIAGNOSIS — K219 Gastro-esophageal reflux disease without esophagitis: Secondary | ICD-10-CM | POA: Diagnosis not present

## 2018-07-28 DIAGNOSIS — E785 Hyperlipidemia, unspecified: Secondary | ICD-10-CM

## 2018-07-28 DIAGNOSIS — E1165 Type 2 diabetes mellitus with hyperglycemia: Secondary | ICD-10-CM

## 2018-07-28 DIAGNOSIS — R87810 Cervical high risk human papillomavirus (HPV) DNA test positive: Secondary | ICD-10-CM

## 2018-07-28 DIAGNOSIS — Z7982 Long term (current) use of aspirin: Secondary | ICD-10-CM | POA: Diagnosis not present

## 2018-07-28 DIAGNOSIS — F17210 Nicotine dependence, cigarettes, uncomplicated: Secondary | ICD-10-CM | POA: Diagnosis not present

## 2018-07-28 DIAGNOSIS — J4540 Moderate persistent asthma, uncomplicated: Secondary | ICD-10-CM

## 2018-07-28 DIAGNOSIS — N898 Other specified noninflammatory disorders of vagina: Secondary | ICD-10-CM

## 2018-07-28 DIAGNOSIS — Z7984 Long term (current) use of oral hypoglycemic drugs: Secondary | ICD-10-CM

## 2018-07-28 DIAGNOSIS — Z889 Allergy status to unspecified drugs, medicaments and biological substances status: Secondary | ICD-10-CM

## 2018-07-28 LAB — POCT GLYCOSYLATED HEMOGLOBIN (HGB A1C): Hemoglobin A1C: 12 % — AB (ref 4.0–5.6)

## 2018-07-28 LAB — GLUCOSE, CAPILLARY: Glucose-Capillary: 357 mg/dL — ABNORMAL HIGH (ref 70–99)

## 2018-07-28 MED ORDER — METFORMIN HCL 500 MG PO TABS: 500.0000 mg | ORAL_TABLET | Freq: Two times a day (BID) | ORAL | 3 refills | Status: DC

## 2018-07-28 MED ORDER — ACCU-CHEK GUIDE VI STRP: ORAL_STRIP | 12 refills | Status: DC

## 2018-07-28 MED ORDER — FLUTICASONE-SALMETEROL 500-50 MCG/DOSE IN AEPB: 1.0000 | INHALATION_SPRAY | Freq: Two times a day (BID) | RESPIRATORY_TRACT | 3 refills | Status: DC

## 2018-07-28 MED ORDER — ACCU-CHEK FASTCLIX LANCETS MISC: 6 refills | Status: DC

## 2018-07-28 NOTE — Assessment & Plan Note (Addendum)
BP 130/67 at this visit. Patient is on amlodipine 10mg  qd and HCTZ 25mg  qd. Patient reports home SBP 140-150.   - Continue amlodipine 10mg  qd and HCTZ 25mg  qd

## 2018-07-28 NOTE — Assessment & Plan Note (Signed)
This is chronic and stable. Patient taking Miralax and has one bowel movement per day. Patient denies abdominal pain, nausea/vomiting. On exam, normoactive bowel sounds present without abdominal distension or tenderness.   - Continue Miralax

## 2018-07-28 NOTE — Assessment & Plan Note (Signed)
Patient reports smoking greater than 1/2 ppd of cigarettes. She states that she has nicotine patches at home which have helped her in the past. She was counseled on smoking cessation and reports that she does not want any pharmacotherapy at this time.  - Continue to monitor and encourage smoking cessation

## 2018-07-28 NOTE — Assessment & Plan Note (Signed)
This is chronic and stable. Patient has no acute complaints and reports relief with use of voltaren gel.   - Continue voltaren gel

## 2018-07-28 NOTE — Progress Notes (Signed)
   CC: follow up for hypertension, DM and asthma   HPI:  Crystal Briggs is a 53 y.o. female w/PMHx of HTN, Type II DM and asthma presenting to clinic for follow up of her chronic medical conditions. She has no acute concerns at this visit. Please see problem based charting for assessment and plan of patient's current medical conditions.   Patient reports no surgical history.   Social Hx:  Patient lives in house with her female friend and his two daughters. She reports feeling safe at home. She is a current smoker and smokes >1/2 pack per day for ~20 years. Denies alcohol use or recreational drug use.   FamilyHx:  Sister - DM Mother - DM and "stomach issue", deceased in 05-16-2011 Father - CAD, deceased in 05/16/2011 from MI   Past Medical History:  Diagnosis Date  . Asthma, chronic 03/30/2012  . Essential hypertension, benign 03/30/2012  . Hypertension   . Seasonal allergies 03/30/2012  . Stress incontinence, female 03/30/2012  . Tobacco abuse 03/30/2012   Review of Systems:  Review of Systems  Constitutional: Negative for chills, fever, malaise/fatigue and weight loss.  HENT: Negative for congestion, sinus pain and sore throat.   Respiratory: Negative for cough and shortness of breath.   Cardiovascular: Negative for chest pain, palpitations and leg swelling.  Gastrointestinal: Negative for abdominal pain, constipation, diarrhea, melena, nausea and vomiting.  Genitourinary: Negative for dysuria, frequency, hematuria and urgency.  Musculoskeletal: Positive for back pain and joint pain.       Chronic right knee pain  Skin: Negative for rash.  Neurological: Negative for dizziness, sensory change, focal weakness and headaches.     Physical Exam:  Vitals:   07/28/18 0938  BP: 130/67  Pulse: 96  Temp: 99 F (37.2 C)  TempSrc: Oral  SpO2: 95%  Weight: 249 lb 11.2 oz (113.3 kg)  Height: 5\' 1"  (1.549 m)   Physical Exam  Constitutional: She is oriented to person, place, and time and  well-developed, well-nourished, and in no distress.  HENT:  Head: Normocephalic and atraumatic.  Eyes: Conjunctivae are normal.  Neck: Normal range of motion. Neck supple.  Cardiovascular: Normal rate, regular rhythm, normal heart sounds and intact distal pulses. Exam reveals no gallop and no friction rub.  No murmur heard. Pulmonary/Chest: Effort normal and breath sounds normal. No respiratory distress. She has no wheezes. She has no rales.  Abdominal: Soft. Bowel sounds are normal. She exhibits no distension and no mass. There is no abdominal tenderness.  Musculoskeletal: Normal range of motion.        General: No deformity or edema.  Neurological: She is alert and oriented to person, place, and time.  Skin: Skin is warm and dry.     Assessment & Plan:   See Encounters Tab for problem based charting.  Patient seen with Dr. Angelia Mould

## 2018-07-28 NOTE — Assessment & Plan Note (Signed)
Patient presented in 02/2018 with complaints of vaginal itching and white vaginal discharge. She was found to have BV, Trichomoniasis and candida vaginitis. She completed Flagyl and Diflucan. 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 She has appointment with gynecologist on 7/29 for colposcopy. Patient denies any vaginal discharge, vaginal bleeding or itching at this time.   - Follow up with gynecology

## 2018-07-28 NOTE — Progress Notes (Signed)
Diabetes Self-Management Education  Visit Type: First/Initial  Appt. Start Time: 1125 Appt. End Time: 1517  07/28/2018  [name], identified by name and date of birth, is a 53 y.o. female with a diagnosis of Diabetes: Type 2.   ASSESSMENT  Wt Readings from Last 5 Encounters:  07/28/18 249 lb 11.2 oz (113.3 kg)  03/13/18 258 lb 4.8 oz (117.2 kg)  02/17/18 262 lb 3.2 oz (118.9 kg)  07/22/17 274 lb 11.2 oz (124.6 kg)  03/18/17 273 lb 12.8 oz (124.2 kg)    Lab Results  Component Value Date   HGBA1C 12.0 (A) 07/28/2018   HGBA1C 7.4 (A) 02/17/2018   HGBA1C 5.4 08/18/2014    Estimated body mass index is 47.18 kg/m as calculated from the following:   Height as of an earlier encounter on 07/28/18: 5\' 1"  (1.549 m).   Weight as of an earlier encounter on 07/28/18: 249 lb 11.2 oz (113.3 kg).   Diabetes Self-Management Education - 07/28/18 1100      Visit Information   Visit Type  First/Initial      Initial Visit   Diabetes Type  Type 2    Are you currently following a meal plan?  No    Are you taking your medications as prescribed?  Not on Medications    Date Diagnosed  11/2017      Psychosocial Assessment   Patient Belief/Attitude about Diabetes  Motivated to manage diabetes    Self-care barriers  Lack of material resources    Self-management support  Doctor's office;Family;CDE visits    Patient Concerns  Monitoring    Special Needs  None    Preferred Learning Style  No preference indicated    Learning Readiness  Ready      Pre-Education Assessment   Patient understands monitoring blood glucose, interpreting and using results  Needs Instruction      Complications   Last HgB A1C per patient/outside source  12 %    How often do you check your blood sugar?  0 times/day (not testing)    Fasting Blood glucose range (mg/dL)  >200    Postprandial Blood glucose range (mg/dL)  >200      Patient Education   Monitoring  Taught/evaluated SMBG meter.   accu chek guide  me meter was provided to patient today     Individualized Goals (developed by patient)   Monitoring   test my blood glucose as discussed      Outcomes   Expected Outcomes  Demonstrated interest in learning. Expect positive outcomes    Future DMSE  2 wks    Program Status  Completed       Individualized Plan for Diabetes Self-Management Training:   Learning Objective:  Patient will have a greater understanding of diabetes self-management. Patient education plan is to attend individual and/or group sessions per assessed needs and concerns.   Plan:   There are no Patient Instructions on file for this visit.  Expected Outcomes:  Demonstrated interest in learning. Expect positive outcomes Education material provided: Diabetes Resources If problems or questions, patient to contact team via:  Phone Future DSME appointment: 2 wks  Debera Lat, RD 07/28/2018 11:56 AM.

## 2018-07-28 NOTE — Assessment & Plan Note (Signed)
HbA1c 12.0 at this visit. Patient reports dietary noncompliance. She denies any polyuria, polydipsia or polyphagia. She denies any dizziness, headaches, sensory changes or weakness. Foot exam on this visit normal with one small callus on left heel.   Plan:  - Start Metformin  - Urine creatinine/microalbumin ratio  - Referral to Butch Penny placed for diabetes nutrition education - Accucheck provided for home BGL monitoring  - Follow up in 3 months for A1c check

## 2018-07-28 NOTE — Patient Instructions (Signed)
Ms. Gerdts,  It was nice to meet you today!  Please make an appointment with me for Diabetes Self Management Training/Education and support in the next 2-4 weeks. It can be on the same day you see the doctor.   Call anytime.   Butch Penny [phone]

## 2018-07-28 NOTE — Assessment & Plan Note (Signed)
This is chronic and stable. Patient continues to take atorvastatin 40mg  daily and ASA 81mg  daily.  - Continue current regimen

## 2018-07-28 NOTE — Assessment & Plan Note (Addendum)
Patient reports continued Symbicort use as Trelegy not approved by Medicaid. She reports her dyspnea to be well controlled but requires albuterol inhaler 1-2x/day depending on activity level.   - Discontinue Symbicort. - Start Advair Diskus  - Continue montelukast 10mg  qd

## 2018-07-28 NOTE — Assessment & Plan Note (Signed)
Chronic and stable. Patient has no acute complaints at this time. She has Flonase.  - Continue Flonase

## 2018-07-28 NOTE — Assessment & Plan Note (Signed)
This is chronic and stable. Patient is followed by urology. She is taking oxybutynin 5mg  bid. She denies any urinary symptoms at this time.  - Continue oxybutynin 5mg  bid

## 2018-07-28 NOTE — Patient Instructions (Addendum)
Crystal Briggs,  It was a pleasure seeing you in clinic today. Today, we discussed management of your medical conditions.  - For your Hypertension: Continue to take amlodipine and HCTZ  Daily as prescribed. Continue to take aspirin and atorvastatin as prescribed.  - For your Diabetes: Your HbA1c was elevated to 12.0 today. Please start taking metformin 500mg  with dinner for the first week; metformin 500mg  with breakfast and dinner for the second week; metformin 500mg  with breakfast and 1000mg  with dinner for the third week; and metformin 1000mg  with breakfast and dinner for the fourth week and continue over the next few months. As we discussed, this could cause some GI side effects like upset stomach and diarrhea. If this is the case, please contact us and we will adjust your dosage as necessary.  Please check your blood glucose levels at home once a day and keep a log. Please bring your glucose meter with you at the next visit.  Please follow up with Crystal Briggs for diabetes and nutrition management.  - For your GERD: Continue to take Famotidine and Prantoprazole as prescribed. - For your Asthma: Please discontinue Symbicort and start taking Advair and albuterol as needed. We will adjust your medications as needed. - For your urinary incontinence: Please continue to take oxybutinin as prescribed. - For your abnormal pap smear results: Follow up with gynecologist on 7/29 for colposcopy.   Thank you!

## 2018-07-28 NOTE — Assessment & Plan Note (Signed)
This is chronic and stable. Patient reports symptoms are well controlled with Protonix and Pepcid.   - Continue Protonix 40mg  qd and Famotidine 20mg  qd

## 2018-07-28 NOTE — Assessment & Plan Note (Signed)
This is chronic and stable. Patient continues to use voltaren gel to help with pain relief with occasional NSAID use.  - Continue voltaren gel

## 2018-07-29 LAB — MICROALBUMIN / CREATININE URINE RATIO
Creatinine, Urine: 92.5 mg/dL
Microalb/Creat Ratio: 23 mg/g creat (ref 0–29)
Microalbumin, Urine: 21 ug/mL

## 2018-07-30 NOTE — Progress Notes (Signed)
Internal Medicine Clinic Attending  I saw and evaluated the patient.  I personally confirmed the key portions of the history and exam documented by Dr. Aslam and I reviewed pertinent patient test results.  The assessment, diagnosis, and plan were formulated together and I agree with the documentation in the resident's note.     

## 2018-07-31 NOTE — Progress Notes (Signed)
Patient informed that her Urine microalbumin/creatinine ratio is wnl. She reports she is taking Metformin which has resulted in improved blood glucose levels (was in 300's vs this AM was 250s). Patient is tolerating Metformin well at this time w/o any GI symptoms. Patient also reports improvement with Advair and reports that she has not had to use her albuterol since starting Advair.

## 2018-08-07 ENCOUNTER — Other Ambulatory Visit: Payer: Self-pay | Admitting: Internal Medicine

## 2018-08-07 DIAGNOSIS — E1165 Type 2 diabetes mellitus with hyperglycemia: Secondary | ICD-10-CM

## 2018-08-07 NOTE — Telephone Encounter (Signed)
REFILL REQUEST  metFORMIN (GLUCOPHAGE) 500 MG tablet  [id number] [place], [place] - [phone] (Phone) [phone] (Fax)   Only have enough until Saturday.

## 2018-08-10 MED ORDER — METFORMIN HCL 500 MG PO TABS: 500.0000 mg | ORAL_TABLET | Freq: Two times a day (BID) | ORAL | 3 refills | Status: DC

## 2018-08-11 ENCOUNTER — Other Ambulatory Visit: Payer: Self-pay | Admitting: *Deleted

## 2018-08-11 DIAGNOSIS — K219 Gastro-esophageal reflux disease without esophagitis: Secondary | ICD-10-CM

## 2018-08-11 MED ORDER — FAMOTIDINE 20 MG PO TABS: 20.0000 mg | ORAL_TABLET | Freq: Every day | ORAL | 1 refills | Status: DC

## 2018-08-12 ENCOUNTER — Ambulatory Visit: Payer: Medicaid Other | Admitting: Obstetrics & Gynecology

## 2018-08-31 ENCOUNTER — Telehealth: Payer: Self-pay | Admitting: Dietician

## 2018-08-31 ENCOUNTER — Ambulatory Visit (INDEPENDENT_AMBULATORY_CARE_PROVIDER_SITE_OTHER): Payer: Medicaid Other | Admitting: Dietician

## 2018-08-31 ENCOUNTER — Encounter: Payer: Self-pay | Admitting: Dietician

## 2018-08-31 DIAGNOSIS — Z713 Dietary counseling and surveillance: Secondary | ICD-10-CM

## 2018-08-31 DIAGNOSIS — E118 Type 2 diabetes mellitus with unspecified complications: Secondary | ICD-10-CM | POA: Diagnosis not present

## 2018-08-31 DIAGNOSIS — E1165 Type 2 diabetes mellitus with hyperglycemia: Secondary | ICD-10-CM

## 2018-08-31 NOTE — Telephone Encounter (Signed)
Called Ms. Rather about her referral for newly diagnosed diabetes. She did not want to come in sooner than 3 months.  We met on the phone today via telehealth to meet sooner than that and will follow up in 2-3 weeks. [name]

## 2018-08-31 NOTE — Progress Notes (Signed)
Diabetes Self-Management Education  This is a telephone encounter between Crystal Briggs  and Crystal Briggs on 08/31/2018 for diabtes. The visit was conducted with the patient located at home and Debera Lat at [facility]. The patient's identity was confirmed using their DOB and current address. The patient has consented to being evaluated through a telephone encounter and understands the associated risks / benefits (allows the patient to remain at home, decreasing exposure to coronavirus). I personally spent 30 minutes on diabetes self management education ans support discussion.   uardian consented to telephone visit: yes  Visit Type: Follow-up  Appt. Start Time: 1430 Appt. End Time: 1500  08/31/2018  [name], identified by name and date of birth, is a 53 y.o. female with a diagnosis of Diabetes:  .   ASSESSMENT Newly diagnosed  Diabetes a few weeks ago. She reports her blood sugar  has dropped, checking 2x/day the following values:  142/137 151/117 158/123 133/117 105/129 124/135 122/200- had a banana  125 this am.  Metformin-500 mg tablets- 1 with breakfast, 2 in the evening.  Food intake-  She shops and cooks for 5 people,. Knows food makes blood sugar  "go up". Mother had dm, small portions of healthy foods, tuna sandwich, fruit, baked or boiled.  Breakfast- 8 am oatmeal, hard boiled egg  snack fruit- apple/pear,  Dinner- 5-6 pm- baked chicken, hamburger. Fish rice, mashed pot, salad Beverages- water, sweetened with sweetened tea. Wants a Chart for eating for diabetes. Will mail today.  Exercise- knows she needs to get out walking,  Walks in [employer] two times a week.  Lab Results  Component Value Date   HGBA1C 12.0 (A) 07/28/2018   HGBA1C 7.4 (A) 02/17/2018   HGBA1C 5.4 08/18/2014    Wt Readings from Last 5 Encounters:  07/28/18 249 lb 11.2 oz (113.3 kg)  03/13/18 258 lb 4.8 oz (117.2 kg)  02/17/18 262 lb 3.2 oz (118.9 kg)  07/22/17 274 lb 11.2 oz (124.6 kg)   03/18/17 273 lb 12.8 oz (124.2 kg)     Diabetes Self-Management Education - 08/31/18 1700      Visit Information   Visit Type  Follow-up      Psychosocial Assessment   Self-management support  Family      Pre-Education Assessment   Patient understands incorporating nutritional management into lifestyle.  Needs Instruction    Patient undertands incorporating physical activity into lifestyle.  Needs Instruction    Patient understands using medications safely.  Needs Instruction    Patient understands prevention, detection, and treatment of acute complications.  Needs Instruction    Patient understands prevention, detection, and treatment of chronic complications.  Needs Instruction      Complications   How often do you check your blood sugar?  1-2 times/day    Fasting Blood glucose range (mg/dL)  70-129;130-179    Postprandial Blood glucose range (mg/dL)  180-200;>200      Dietary Intake   Breakfast  8 am- oatmaklm hard boiled egg    Lunch  fruit    Dinner  5-6 Pm baked fish r chicken, salad, mashed potatoes    Beverage(s)  water sweet tea with non nutritive sweetener      Exercise   Exercise Type  ADL's;Light (walking / raking leaves)    How many days per week to you exercise?  2    How many minutes per day do you exercise?  20    Total minutes per week of exercise  40  Patient Education   Previous Diabetes Education  Yes (please comment)   here for self monitoring   Nutrition management   Role of diet in the treatment of diabetes and the relationship between the three main macronutrients and blood glucose level;Carbohydrate counting    Medications  Reviewed patients medication for diabetes, action, purpose, timing of dose and side effects.      Individualized Goals (developed by patient)   Medications  take my medication as prescribed      Patient Self-Evaluation of Goals - Patient rates self as meeting previously set goals (% of time)   Medications  >75%       Outcomes   Expected Outcomes  Demonstrated interest in learning. Expect positive outcomes    Future DMSE  2 wks    Program Status  Re-entered       Individualized Plan for Diabetes Self-Management Training:   Learning Objective:  Patient will have a greater understanding of diabetes self-management. Patient education plan is to attend individual and/or group sessions per assessed needs and concerns.   Plan:   There are no Patient Instructions on file for this visit.  Expected Outcomes:  Demonstrated interest in learning. Expect positive outcomes  Education material provided: My Plate  If problems or questions, patient to contact team via:  Phone  Future DSME appointment: 2 wks  Norm ParcelDonna Kaleiah Kutzer, RD 08/31/2018 5:32 PM.

## 2018-09-01 NOTE — Patient Instructions (Signed)
Carbohydrate Counting for Diabetes Mellitus, Adult  Carbohydrate counting is a method of keeping track of how many carbohydrates you eat.   Eating carbohydrates naturally increases the amount of sugar (glucose) in the blood.   Counting how many carbohydrates you eat helps keep your blood glucose within normal limits, which helps you manage your diabetes (diabetes mellitus).  It is important to know how many carbohydrates you can safely have in each meal. This is different for every person.   Carbohydrates are found in the following foods:  Grains, such as breads and cereals.  Dried beans and soy products.  Starchy vegetables, such as potatoes, peas, and corn.  Fruit and fruit juices.  Milk and yogurt.  Sweets and snack foods, such as cake, cookies, candy, chips, and soft drinks.   How do I count carbohydrates? There are two ways to count carbohydrates in food. You can use either of the methods or a combination of both.  Reading "Nutrition Facts" on packaged food The "Nutrition Facts" list is included on the labels of almost all packaged foods and beverages in the [place] It includes:  The serving size.  Information about nutrients in each serving, including the grams (g) of carbohydrate per serving.  Learning standard serving sizes of other foods When you eat carbohydrate foods that are not packaged or do not include "Nutrition Facts" on the label, you need to measure the servings in order to count the amount of carbohydrates:  Measure the foods that you will eat with a food scale or measuring cup, if needed.  Decide how many standard-size servings you will eat.  Multiply the number of servings by 15. Most carbohydrate-rich foods have about 15 g of carbohydrates per serving. ? For example, if you eat 8 oz (170 g) of strawberries, you will have eaten 2 servings and 30 g of carbohydrates (2 servings x 15 g = 30 g).  For foods that have more than one food mixed, such as soups  and casseroles, you must count the carbohydrates in each food that is included.   Summary  Carbohydrate counting is a method of keeping track of how many carbohydrates you eat.  Eating carbohydrates naturally increases the amount of sugar (glucose) in the blood.  Counting how many carbohydrates you eat helps keep your blood glucose within normal limits, which helps you manage your diabetes.  A diet and nutrition specialist (registered dietitian) can help you make a meal plan and know how many carbohydrates you should have at each meal and snack.

## 2018-09-02 ENCOUNTER — Other Ambulatory Visit: Payer: Self-pay | Admitting: *Deleted

## 2018-09-03 MED ORDER — ASPIRIN EC 81 MG PO TBEC: 81.0000 mg | DELAYED_RELEASE_TABLET | Freq: Every day | ORAL | 3 refills | Status: DC

## 2018-09-08 ENCOUNTER — Other Ambulatory Visit: Payer: Self-pay | Admitting: Internal Medicine

## 2018-09-08 ENCOUNTER — Other Ambulatory Visit: Payer: Self-pay

## 2018-09-08 DIAGNOSIS — E1165 Type 2 diabetes mellitus with hyperglycemia: Secondary | ICD-10-CM

## 2018-09-08 MED ORDER — ACCU-CHEK GUIDE VI STRP: ORAL_STRIP | 12 refills | Status: DC

## 2018-09-08 NOTE — Telephone Encounter (Signed)
Requesting to speak with a nurse about test strips. Please call pt back.  

## 2018-09-08 NOTE — Telephone Encounter (Signed)
Called pt - informed she should have plenty of refills on her test strips. Asked if she had called the pharmacy ; stated she was told too early for next refill. Instructed to call the pharmacy back to see when next refill is due and see if they would let her purchases some out of pocket. Stated she will call the pharmacy.

## 2018-09-08 NOTE — Telephone Encounter (Signed)
Needs refill on glucose blood (ACCU-CHEK GUIDE) test strip [id number] [place] [place] [address] ;pt contact [phone]

## 2018-09-08 NOTE — Telephone Encounter (Signed)
Pt stated she has been checking her blood sugars 2 times a day instead of once a day, so now she almost out of test strips. Talked to [facility] - stated pt's A1C is 12.0 she can test 2 times a day until pt's BS are better under control. Please send new rx " Test 2 times day" and pt will be able to receive a refill. Thanks

## 2018-09-08 NOTE — Telephone Encounter (Signed)
Pt called / informed of refill on test strips.

## 2018-09-10 ENCOUNTER — Ambulatory Visit: Payer: Medicaid Other | Admitting: Obstetrics and Gynecology

## 2018-09-10 ENCOUNTER — Other Ambulatory Visit: Payer: Self-pay

## 2018-09-10 ENCOUNTER — Other Ambulatory Visit (HOSPITAL_COMMUNITY)
Admission: RE | Admit: 2018-09-10 | Discharge: 2018-09-10 | Disposition: A | Discharge status: Home / Self Care | Payer: Medicaid Other | Source: Ambulatory Visit | Attending: Obstetrics and Gynecology | Admitting: Obstetrics and Gynecology

## 2018-09-10 ENCOUNTER — Encounter: Payer: Self-pay | Admitting: Obstetrics and Gynecology

## 2018-09-10 VITALS — BP 153/84 | HR 109 | Ht 61.0 in | Wt 245.3 lb

## 2018-09-10 DIAGNOSIS — Z01419 Encounter for gynecological examination (general) (routine) without abnormal findings: Secondary | ICD-10-CM

## 2018-09-10 DIAGNOSIS — Z113 Encounter for screening for infections with a predominantly sexual mode of transmission: Secondary | ICD-10-CM

## 2018-09-10 DIAGNOSIS — Z0000 Encounter for general adult medical examination without abnormal findings: Secondary | ICD-10-CM | POA: Diagnosis not present

## 2018-09-10 DIAGNOSIS — R87810 Cervical high risk human papillomavirus (HPV) DNA test positive: Secondary | ICD-10-CM | POA: Insufficient documentation

## 2018-09-10 NOTE — Progress Notes (Signed)
New Patient is in the office, referred from [place]. Last pap 03-13-18 Pt states she has not had a mammogram this year. Pt denies any pain/discomfort today. GAD-7= 0

## 2018-09-10 NOTE — Progress Notes (Signed)
.    GYNECOLOGY ANNUAL PREVENTATIVE CARE ENCOUNTER NOTE  Subjective:   Crystal Briggs is a 53 y.o. G55P2002 female here for a annual gynecologic exam. Current complaints: none. Here for follow up for pap 02/2018 with neg cytology, positive high risk HPV.   Denies abnormal vaginal bleeding, discharge, pelvic pain, problems with intercourse or other gynecologic concerns.   LMP age 77, no bleeding since.   Gynecologic History Patient's last menstrual period was 12/14/2015. Contraception: post menopausal status Last Pap: 02/2018 Results were: negative cytology, positive high risk HPV Last mammogram: 09/2016. Results were: Birads 1  Obstetric History OB History  Gravida Para Term Preterm AB Living  2 2 2     2   SAB TAB Ectopic Multiple Live Births          2    # Outcome Date GA Lbr Len/2nd Weight Sex Delivery Anes PTL Lv  2 Term 02/27/92     Vag-Spont   LIV  1 Term 02/03/83     Vag-Spont   LIV    Past Medical History:  Diagnosis Date  . Asthma, chronic 03/30/2012  . Essential hypertension, benign 03/30/2012  . Hypertension   . Seasonal allergies 03/30/2012  . Stress incontinence, female 03/30/2012  . Tobacco abuse 03/30/2012    History reviewed. No pertinent surgical history.  Current Outpatient Medications on File Prior to Visit  Medication Sig Dispense Refill  . Accu-Chek FastClix Lancets MISC Check blood sugar 1 times a day 102 each 6  . albuterol (PROVENTIL HFA) 108 (90 Base) MCG/ACT inhaler Inhale 1-2 puffs into the lungs every 6 (six) hours as needed for shortness of breath. 3 Inhaler 3  . amLODipine (NORVASC) 10 MG tablet Take 1 tablet (10 mg total) by mouth daily. 90 tablet 3  . aspirin EC 81 MG tablet Take 1 tablet (81 mg total) by mouth daily. 90 tablet 3  . atorvastatin (LIPITOR) 40 MG tablet Take 1 tablet (40 mg total) by mouth daily. 90 tablet 3  . cetirizine (ZYRTEC) 10 MG tablet TAKE 1 TABLET BY MOUTH EVERY DAY 90 tablet 1  . famotidine (PEPCID) 20 MG tablet Take  1 tablet (20 mg total) by mouth at bedtime. 90 tablet 1  . fluconazole (DIFLUCAN) 150 MG tablet Take 1 tablet (150 mg) now, then take 1 tablet (150 mg) after completing 7 days of antibiotic Flagyl. 2 tablet 0  . fluticasone (FLONASE) 50 MCG/ACT nasal spray SPRAY 2 SPRAYS INTO EACH NOSTRIL EVERY DAY 16 g 5  . Fluticasone-Salmeterol (ADVAIR DISKUS) 500-50 MCG/DOSE AEPB Inhale 1 puff into the lungs 2 (two) times daily. 60 each 3  . glucose blood (ACCU-CHEK GUIDE) test strip Check blood sugar 2 times per day 100 each 12  . hydrochlorothiazide (HYDRODIURIL) 25 MG tablet Take 1 tablet (25 mg total) by mouth daily. 90 tablet 3  . metFORMIN (GLUCOPHAGE) 500 MG tablet Take 1 tablet (500 mg total) by mouth 2 (two) times daily with a meal. Week 1: Take 1 tablet (500mg ) by mouth once a day with dinner Week 2: Take 1 tablet (500mg ) by mouth with breakfast and 1 tablet (500mg ) by mouth with dinner Week 3: Take 1 tablet (500mg ) by mouth with breakfast and 2 tablet (1000mg ) by mouth with dinner Week 4 and beyond: Take 2 tablet (1000mg ) by mouth with breakfast and 2 tablet (1000mg ) by mouth with dinner 30 tablet 3  . montelukast (SINGULAIR) 10 MG tablet TAKE 1 TABLET BY MOUTH EVERYDAY AT BEDTIME 90 tablet 3  .  oxybutynin (DITROPAN-XL) 5 MG 24 hr tablet Take 1 tablet (5 mg total) by mouth 2 (two) times daily. 60 tablet 2  . pantoprazole (PROTONIX) 40 MG tablet TAKE 1 TABLET BY MOUTH EVERY DAY BEFORE BREAKFAST 90 tablet 3  . polyethylene glycol (MIRALAX / GLYCOLAX) packet TAKE 17 GRAMS MIXED IN LIQUID EVERY DAY 28 packet 0  . VOLTAREN 1 % GEL APPLY 2 GRAMS TO THE AFFECTED AREA(S) 4 (FOUR) TIMES DAILY 100 g 3   No current facility-administered medications on file prior to visit.     Allergies  Allergen Reactions  . Tomato Rash    Social History   Socioeconomic History  . Marital status: Single    Spouse name: Not on file  . Number of children: Not on file  . Years of education: Not on file  . Highest  education level: Not on file  Occupational History  . Not on file  Social Needs  . Financial resource strain: Not on file  . Food insecurity    Worry: Not on file    Inability: Not on file  . Transportation needs    Medical: Not on file    Non-medical: Not on file  Tobacco Use  . Smoking status: Current Every Day Smoker    Packs/day: 0.75    Years: 20.00    Pack years: 15.00    Types: Cigarettes  . Smokeless tobacco: Never Used  . Tobacco comment: 8 cigs per day  Substance and Sexual Activity  . Alcohol use: No    Alcohol/week: 0.0 standard drinks  . Drug use: No  . Sexual activity: Yes  Lifestyle  . Physical activity    Days per week: Not on file    Minutes per session: Not on file  . Stress: Not on file  Relationships  . Social Musicianconnections    Talks on phone: Not on file    Gets together: Not on file    Attends religious service: Not on file    Active member of club or organization: Not on file    Attends meetings of clubs or organizations: Not on file    Relationship status: Not on file  . Intimate partner violence    Fear of current or ex partner: Not on file    Emotionally abused: Not on file    Physically abused: Not on file    Forced sexual activity: Not on file  Other Topics Concern  . Not on file  Social History Narrative  . Not on file    History reviewed. No pertinent family history.  The following portions of the patient's history were reviewed and updated as appropriate: allergies, current medications, past family history, past medical history, past social history, past surgical history and problem list.  Review of Systems Pertinent items are noted in HPI.   Objective:  BP (!) 153/84   Pulse (!) 109   Ht 5\' 1"  (1.549 m)   Wt 245 lb 4.8 oz (111.3 kg)   LMP 12/14/2015   BMI 46.35 kg/m  CONSTITUTIONAL: Well-developed, well-nourished female in no acute distress.  HENT:  Normocephalic, atraumatic, External right and left ear normal. Oropharynx is  clear and moist EYES: Conjunctivae and EOM are normal. Pupils are equal, round, and reactive to light. No scleral icterus.  NECK: Normal range of motion, supple, no masses.  Normal thyroid.  SKIN: Skin is warm and dry. No rash noted. Not diaphoretic. No erythema. No pallor. NEUROLOGIC: Alert and oriented to person, place, and time.  Normal reflexes, muscle tone coordination. No cranial nerve deficit noted. PSYCHIATRIC: Normal mood and affect. Normal behavior. Normal judgment and thought content. CARDIOVASCULAR: Normal heart rate noted, regular rhythm RESPIRATORY: Clear to auscultation bilaterally. Effort and breath sounds normal, no problems with respiration noted. BREASTS: Symmetric in size. No masses, skin changes, nipple drainage, or lymphadenopathy. ABDOMEN: Soft, normal bowel sounds, no distention noted.  No tenderness, rebound or guarding.  PELVIC: Normal appearing external genitalia; normal appearing vaginal mucosa and cervix.  No abnormal discharge noted. Pelvic cultures obtained.  Normal uterine size, no other palpable masses, no uterine or adnexal tenderness. MUSCULOSKELETAL: Normal range of motion. No tenderness.  No cyanosis, clubbing, or edema.  2+ distal pulses.  Exam done with chaperone present.  Assessment and Plan:   1. Cervical high risk HPV (human papillomavirus) test positive Per ASCCP guidelines, needs repeat pap + HPV 1 year Reviewed abnormal findings with patient, need for follow up due to risk of progression to cervical cancer, importance of following up, she verbalizes understanding - Return for pap 02/2019  2. Routine screening for STI (sexually transmitted infection) - Cervicovaginal ancillary only( [place]) - HIV antibody (with reflex) - Hepatitis B surface antigen - Hepatitis C Antibody - RPR  3. Well woman exam - MM 3D SCREEN BREAST BILATERAL; Future - Ambulatory referral to Gastroenterology   Will follow up results of STI screen and manage  accordingly. Encouraged improvement in diet and exercise.  Mammogram ordered Referral for colonoscopy today  Routine preventative health maintenance measures emphasized. Please refer to After Visit Summary for other counseling recommendations.   Total face-to-face time with patient: 25 minutes. Over 50% of encounter was spent on counseling and coordination of care.   Baldemar Lenis, M.D. Attending [facility] for [employer])

## 2018-09-11 LAB — RPR: RPR Ser Ql: NONREACTIVE

## 2018-09-11 LAB — HIV ANTIBODY (ROUTINE TESTING W REFLEX): HIV Screen 4th Generation wRfx: NONREACTIVE

## 2018-09-11 LAB — HEPATITIS B SURFACE ANTIGEN: Hepatitis B Surface Ag: NEGATIVE

## 2018-09-11 LAB — HEPATITIS C ANTIBODY: Hep C Virus Ab: 0.1 s/co ratio (ref 0.0–0.9)

## 2018-09-14 ENCOUNTER — Ambulatory Visit: Payer: Medicaid Other | Admitting: Dietician

## 2018-09-14 ENCOUNTER — Encounter: Payer: Self-pay | Admitting: Dietician

## 2018-09-14 ENCOUNTER — Telehealth: Payer: Self-pay | Admitting: Dietician

## 2018-09-14 NOTE — Progress Notes (Signed)
Diabetes Self-Management Education Via teleleath  Visit Type: Follow-up  Appt. Start Time: 445 Appt. End Time: 505   This is a telephone encounter between Crystal Briggs  and Crystal Briggs on 09/14/2018 for diabetes education. The visit was conducted with the patient located at home and Crystal Briggs at [facility]. The patient's identity was confirmed using their DOB and current address. The patient has consented to being evaluated through a telephone encounter and understands the associated risks / benefits (allows the patient to remain at home, decreasing exposure to coronavirus). I personally spent 30 minutes on medical nutrition therapy discussion.    Patient and/or legal guardian consented to telephone visit: yes   09/14/2018  [name], identified by name and date of birth, is a 53 y.o. female with a diagnosis of Diabetes: Type 2.   ASSESSMENT Diet- small portions no junk food, salad for snacks or pear Medication- metformin two pills twice a day  Self- monitoring-  Two times a day before breakfast and dinner results for past week: 123  /101/114   /113/ 118 /115  /129 /108 /112  /123/138 / 118    /114 /138 Breakfast cheerios/1%, boiled egg toast, [place] bacon,  1 pm- small salad and fruit 5-6 pm baked chicken, fish, tuna mixed vegetables, meat loaf, brussels sprouts, brown rice Snacks-apple, Beverages- water,diet soda, glass of milk, coffee, tea  Physical activity- a little exercise in the house, walking partner went back to work   Barriers- she doesn't perceive any Goals- Get better, get off some medicine  Diabetes Self-Management Education - 09/14/18 1700      Visit Information   Visit Type  Follow-up      Initial Visit   Diabetes Type  Type 2    Are you currently following a meal plan?  Yes    Are you taking your medications as prescribed?  Yes    Date Diagnosed  07/28/2018      Health Coping   How would you rate your overall health?  Good      Complications   How  often do you check your blood sugar?  1-2 times/day    Fasting Blood glucose range (mg/dL)  70-129      Dietary Intake   Breakfast  oatmeal peanut butter    Lunch  salad and fruit    Dinner  baked foods, veggies, brown rice      Exercise   Exercise Type  ADL's    How many days per week to you exercise?  2    How many minutes per day do you exercise?  20    Total minutes per week of exercise  40      Patient Education   Previous Diabetes Education  Yes (please comment)    Physical activity and exercise   Role of exercise on diabetes management, blood pressure control and cardiac health.    Monitoring  Identified appropriate SMBG and/or A1C goals.      Individualized Goals (developed by patient)   Physical Activity  Exercise 1-2 times per week      Patient Self-Evaluation of Goals - Patient rates self as meeting previously set goals (% of time)   Medications  >75%      Outcomes   Expected Outcomes  Demonstrated limited interest in learning.  Expect minimal changes    Future DMSE  4-6 wks    Program Status  Not Completed      Subsequent Visit   Since your last  visit have you continued or begun to take your medications as prescribed?  Yes    Since your last visit have you had your blood pressure checked?  Yes    Is your most recent blood pressure lower, unchanged, or higher since your last visit?  Higher    Since your last visit have you experienced any weight changes?  Loss    Weight Loss (lbs)  10    Since your last visit, are you checking your blood glucose at least once a day?  Yes       Individualized Plan for Diabetes Self-Management Training:   Learning Objective:  Patient will have a greater understanding of diabetes self-management. Patient education plan is to attend individual and/or group sessions per assessed needs and concerns.   Plan:   There are no Patient Instructions on file for this visit.  Expected Outcomes:  Demonstrated limited interest in learning.   Expect minimal changes  Education material provided: Diabetes Resources  If problems or questions, patient to contact team via:  Phone  Future DSME appointment: 4-6 wks  Norm ParcelDonna Briggs, RD 09/14/2018 5:32 PM.

## 2018-09-14 NOTE — Telephone Encounter (Signed)
See phone note for diabetes follow up via telehealth

## 2018-09-16 LAB — CERVICOVAGINAL ANCILLARY ONLY
Candida vaginitis: NEGATIVE
Chlamydia: NEGATIVE
Neisseria Gonorrhea: NEGATIVE
Trichomonas: POSITIVE — AB

## 2018-09-18 ENCOUNTER — Other Ambulatory Visit: Payer: Self-pay | Admitting: Internal Medicine

## 2018-09-18 DIAGNOSIS — M25561 Pain in right knee: Secondary | ICD-10-CM

## 2018-09-22 MED ORDER — METRONIDAZOLE 500 MG PO TABS: ORAL_TABLET | ORAL | 0 refills | Status: DC

## 2018-09-22 NOTE — Addendum Note (Signed)
Addended by: Vivien Rota on: 09/22/2018 01:49 PM   Modules accepted: Orders

## 2018-09-23 ENCOUNTER — Other Ambulatory Visit: Payer: Self-pay | Admitting: Internal Medicine

## 2018-09-23 DIAGNOSIS — E1165 Type 2 diabetes mellitus with hyperglycemia: Secondary | ICD-10-CM

## 2018-10-12 ENCOUNTER — Telehealth: Payer: Self-pay | Admitting: Dietician

## 2018-10-12 ENCOUNTER — Ambulatory Visit: Payer: Medicaid Other | Admitting: Dietician

## 2018-10-12 NOTE — Telephone Encounter (Signed)
Left a message for a return call.

## 2018-10-14 ENCOUNTER — Encounter: Payer: Self-pay | Admitting: Dietician

## 2018-10-14 ENCOUNTER — Other Ambulatory Visit: Payer: Self-pay | Admitting: *Deleted

## 2018-10-14 MED ORDER — FLUTICASONE PROPIONATE 50 MCG/ACT NA SUSP: NASAL | 5 refills | Status: DC

## 2018-10-14 NOTE — Telephone Encounter (Signed)
Called patient's cell phone number and person who answered hung up after finding out who I was asking for.

## 2018-10-28 ENCOUNTER — Ambulatory Visit: Payer: Medicaid Other

## 2018-11-16 ENCOUNTER — Other Ambulatory Visit: Payer: Self-pay | Admitting: Internal Medicine

## 2018-11-16 DIAGNOSIS — J4540 Moderate persistent asthma, uncomplicated: Secondary | ICD-10-CM

## 2018-12-04 ENCOUNTER — Other Ambulatory Visit: Payer: Self-pay | Admitting: Internal Medicine

## 2018-12-04 DIAGNOSIS — E1165 Type 2 diabetes mellitus with hyperglycemia: Secondary | ICD-10-CM

## 2018-12-14 ENCOUNTER — Other Ambulatory Visit: Payer: Self-pay

## 2018-12-14 MED ORDER — CETIRIZINE HCL 10 MG PO TABS: 10.0000 mg | ORAL_TABLET | Freq: Every day | ORAL | 1 refills | Status: DC

## 2018-12-15 ENCOUNTER — Ambulatory Visit
Admission: RE | Admit: 2018-12-15 | Discharge: 2018-12-15 | Disposition: A | Discharge status: Home / Self Care | Payer: Medicaid Other | Source: Ambulatory Visit | Attending: Obstetrics and Gynecology | Admitting: Obstetrics and Gynecology

## 2018-12-15 ENCOUNTER — Other Ambulatory Visit: Payer: Self-pay

## 2018-12-15 DIAGNOSIS — Z01419 Encounter for gynecological examination (general) (routine) without abnormal findings: Secondary | ICD-10-CM

## 2018-12-15 DIAGNOSIS — Z1231 Encounter for screening mammogram for malignant neoplasm of breast: Secondary | ICD-10-CM | POA: Diagnosis not present

## 2018-12-15 IMAGING — MG DIGITAL SCREENING BILAT W/ TOMO W/ CAD
8 of 16 series · 8 of 40 positions shown · non-contrast
Comparison: Previous exam(s).

ACR Breast Density Category a: The breast tissue is almost entirely
fatty.

CLINICAL DATA: Screening.

EXAM:
DIGITAL SCREENING BILATERAL MAMMOGRAM WITH TOMO AND CAD

[L MLO synth-2D (1 of 2)]
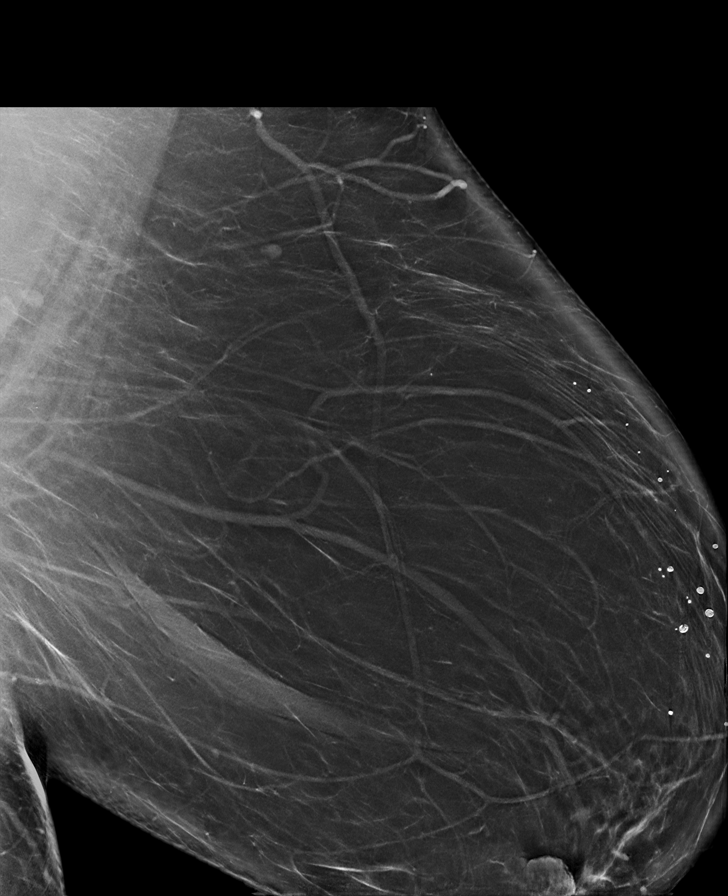

[L CV synth-2D]
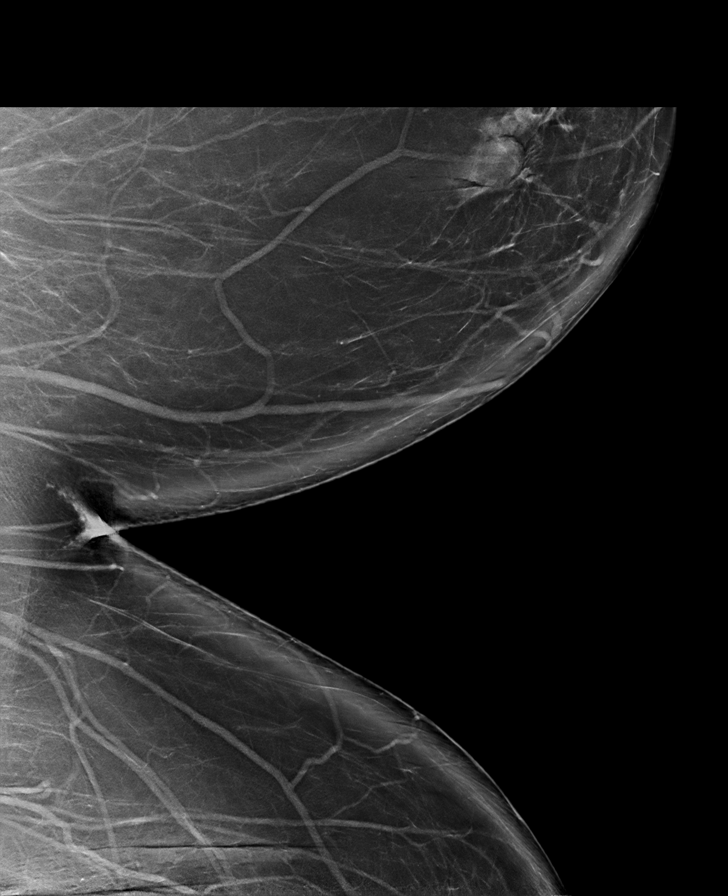

[L CC synth-2D (1 of 2)]
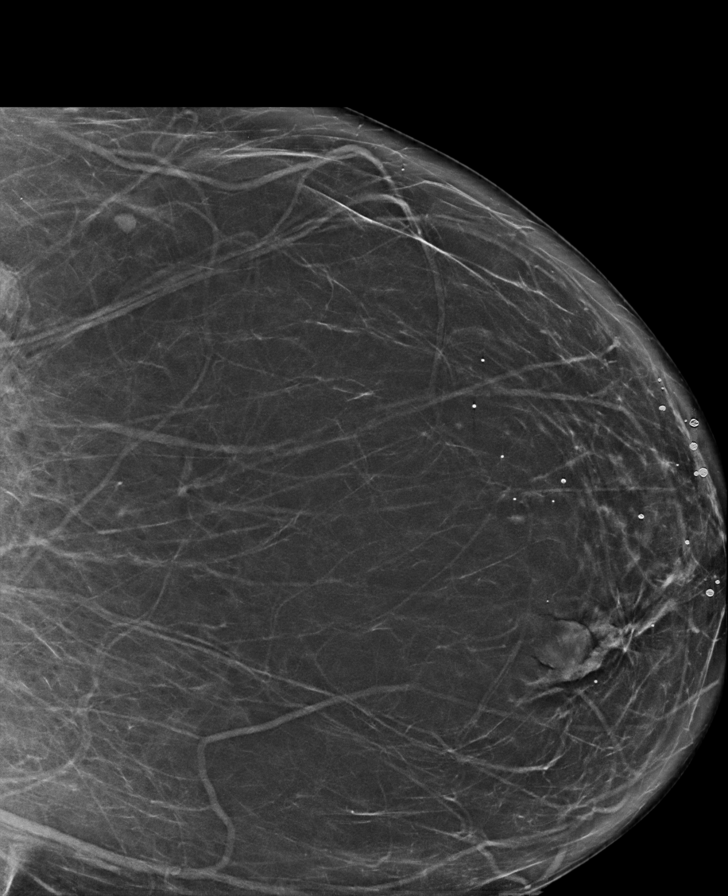

[R CC synth-2D (1 of 2)]
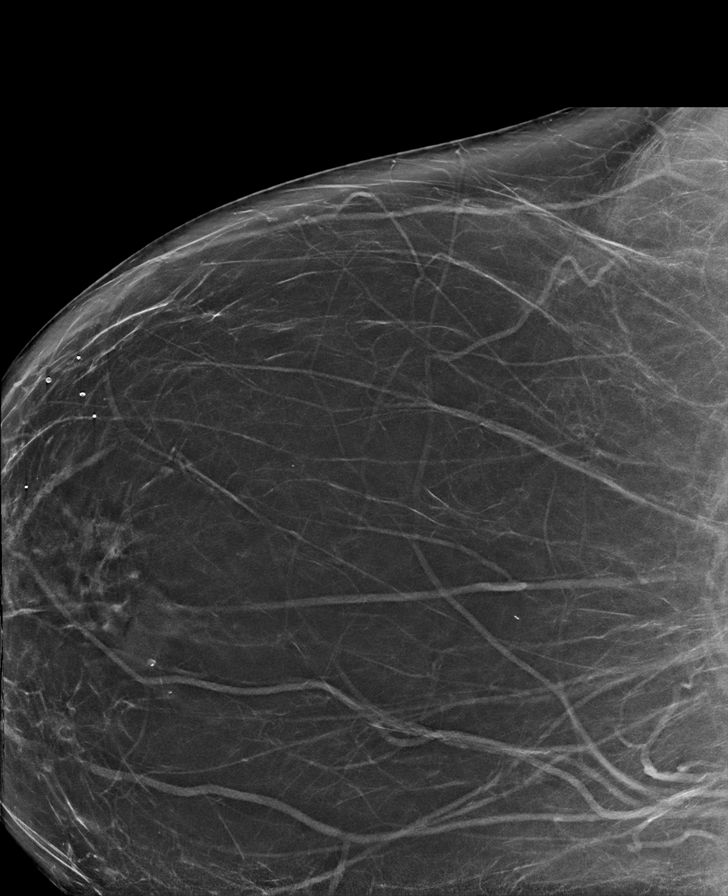

[L CC synth-2D (2 of 2)]
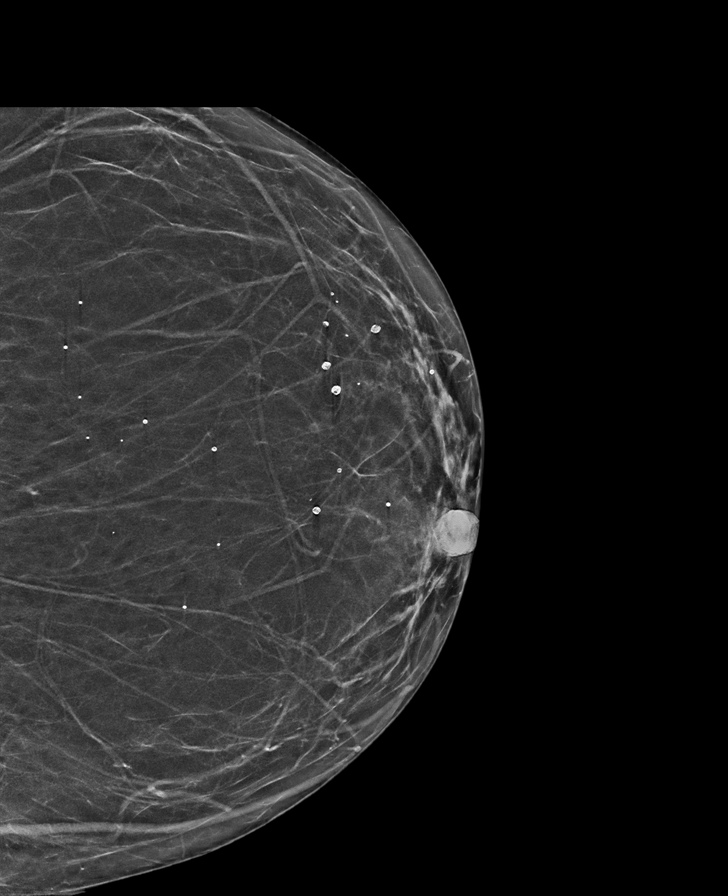

[L MLO synth-2D (2 of 2)]
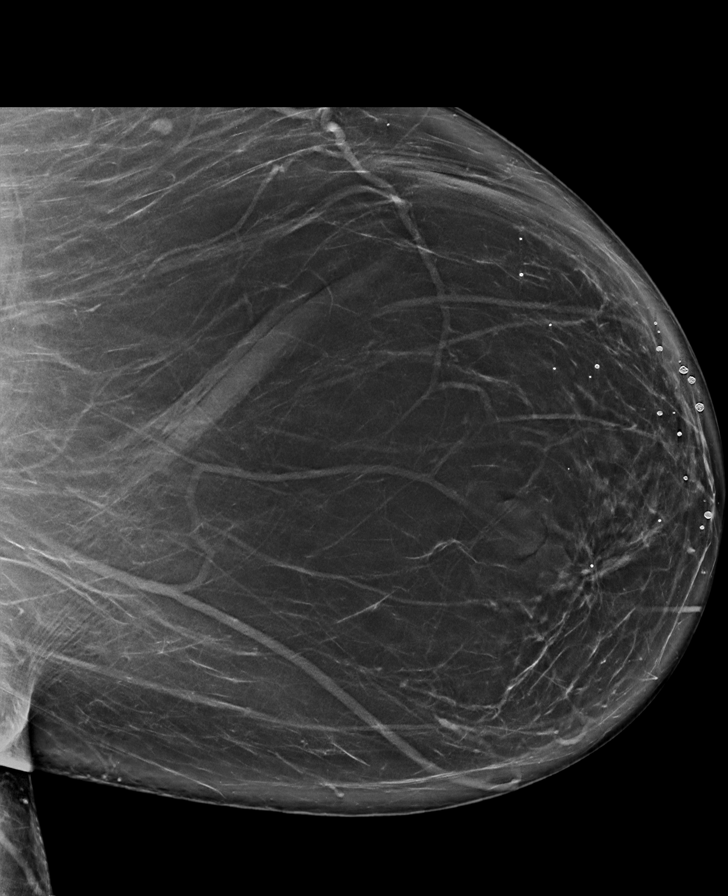

[R CC synth-2D (2 of 2)]
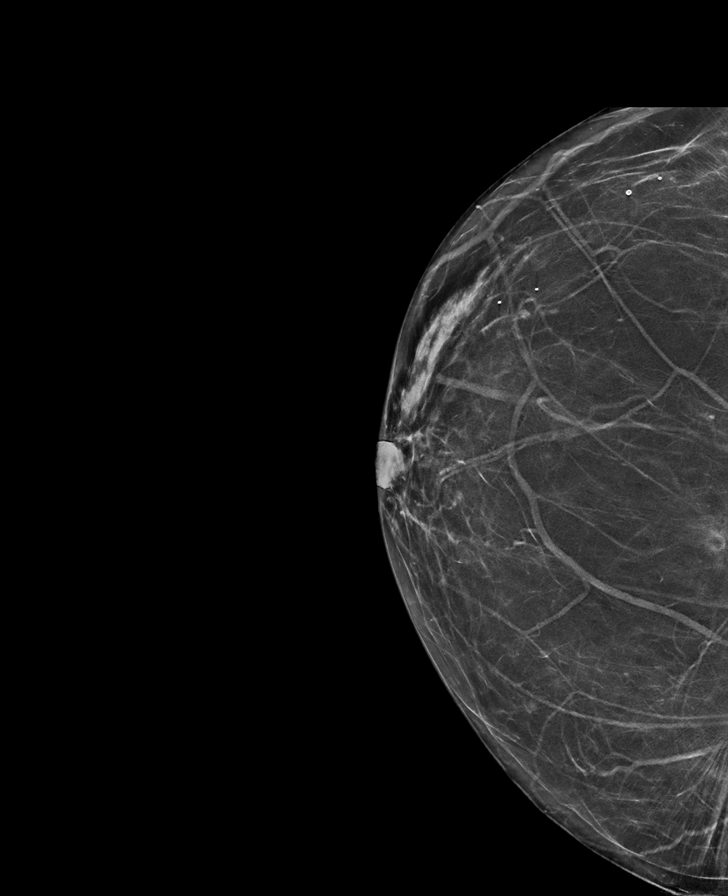

[L MLO tomo · tomo slice 47/92.0]
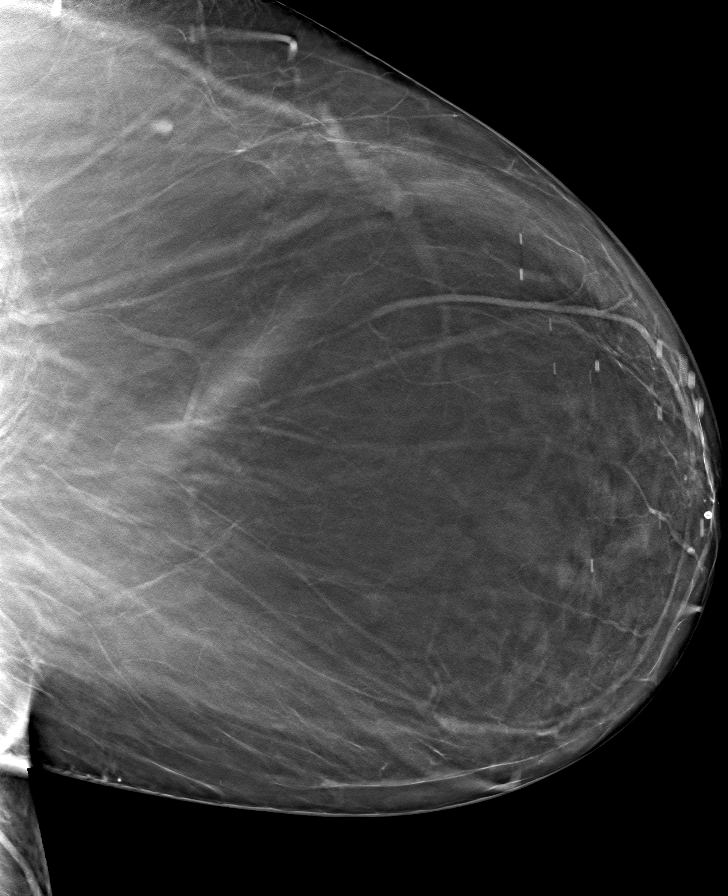

[8 of 40 positions shown; findings below may reference images not displayed]

FINDINGS: There are no findings suspicious for malignancy. Images were
processed with CAD.
IMPRESSION: No mammographic evidence of malignancy. A result letter of this
screening mammogram will be mailed directly to the patient.

RECOMMENDATION:
Screening mammogram in one year. (Code:[TA])

BI-RADS CATEGORY  1: Negative.

## 2018-12-19 ENCOUNTER — Other Ambulatory Visit: Payer: Self-pay | Admitting: Internal Medicine

## 2018-12-19 DIAGNOSIS — R682 Dry mouth, unspecified: Secondary | ICD-10-CM

## 2018-12-19 DIAGNOSIS — N393 Stress incontinence (female) (male): Secondary | ICD-10-CM

## 2019-01-31 DIAGNOSIS — Z20828 Contact with and (suspected) exposure to other viral communicable diseases: Secondary | ICD-10-CM | POA: Diagnosis not present

## 2019-02-02 ENCOUNTER — Other Ambulatory Visit: Payer: Self-pay | Admitting: Internal Medicine

## 2019-02-02 ENCOUNTER — Other Ambulatory Visit: Payer: Self-pay | Admitting: *Deleted

## 2019-02-02 DIAGNOSIS — K219 Gastro-esophageal reflux disease without esophagitis: Secondary | ICD-10-CM

## 2019-02-02 DIAGNOSIS — E785 Hyperlipidemia, unspecified: Secondary | ICD-10-CM

## 2019-02-02 MED ORDER — ATORVASTATIN CALCIUM 40 MG PO TABS: 40.0000 mg | ORAL_TABLET | Freq: Every day | ORAL | 3 refills | Status: DC

## 2019-02-17 ENCOUNTER — Other Ambulatory Visit: Payer: Self-pay | Admitting: Internal Medicine

## 2019-02-17 DIAGNOSIS — J4540 Moderate persistent asthma, uncomplicated: Secondary | ICD-10-CM

## 2019-02-23 ENCOUNTER — Encounter: Payer: Self-pay | Admitting: Internal Medicine

## 2019-02-23 ENCOUNTER — Ambulatory Visit: Payer: Medicaid Other | Admitting: Internal Medicine

## 2019-02-23 ENCOUNTER — Other Ambulatory Visit: Payer: Self-pay

## 2019-02-23 VITALS — BP 132/66 | HR 96 | Temp 98.7°F | Ht 61.0 in | Wt 231.9 lb

## 2019-02-23 DIAGNOSIS — E119 Type 2 diabetes mellitus without complications: Secondary | ICD-10-CM

## 2019-02-23 DIAGNOSIS — K219 Gastro-esophageal reflux disease without esophagitis: Secondary | ICD-10-CM

## 2019-02-23 DIAGNOSIS — R87810 Cervical high risk human papillomavirus (HPV) DNA test positive: Secondary | ICD-10-CM | POA: Diagnosis not present

## 2019-02-23 DIAGNOSIS — E1165 Type 2 diabetes mellitus with hyperglycemia: Secondary | ICD-10-CM

## 2019-02-23 DIAGNOSIS — Z23 Encounter for immunization: Secondary | ICD-10-CM

## 2019-02-23 DIAGNOSIS — Z6841 Body Mass Index (BMI) 40.0 and over, adult: Secondary | ICD-10-CM

## 2019-02-23 DIAGNOSIS — Z7951 Long term (current) use of inhaled steroids: Secondary | ICD-10-CM | POA: Diagnosis not present

## 2019-02-23 DIAGNOSIS — Z7984 Long term (current) use of oral hypoglycemic drugs: Secondary | ICD-10-CM | POA: Diagnosis not present

## 2019-02-23 DIAGNOSIS — J45909 Unspecified asthma, uncomplicated: Secondary | ICD-10-CM

## 2019-02-23 DIAGNOSIS — J4540 Moderate persistent asthma, uncomplicated: Secondary | ICD-10-CM

## 2019-02-23 DIAGNOSIS — Z79899 Other long term (current) drug therapy: Secondary | ICD-10-CM

## 2019-02-23 DIAGNOSIS — I10 Essential (primary) hypertension: Secondary | ICD-10-CM

## 2019-02-23 DIAGNOSIS — Z0000 Encounter for general adult medical examination without abnormal findings: Secondary | ICD-10-CM

## 2019-02-23 LAB — POCT GLYCOSYLATED HEMOGLOBIN (HGB A1C): Hemoglobin A1C: 6.5 % — AB (ref 4.0–5.6)

## 2019-02-23 LAB — HM DIABETES EYE EXAM

## 2019-02-23 LAB — GLUCOSE, CAPILLARY: Glucose-Capillary: 88 mg/dL (ref 70–99)

## 2019-02-23 NOTE — Progress Notes (Signed)
   CC: diabetes f/u   HPI:  [name] is a 54 y.o. female with history of chronic asthma, hypertension and diabetes presenting for diabetes follow up. No acute concerns today. Please see problem based charting for assessment and plan.  Past Medical History:  Diagnosis Date  . Asthma, chronic 03/30/2012  . Essential hypertension, benign 03/30/2012  . Hypertension   . Seasonal allergies 03/30/2012  . Stress incontinence, female 03/30/2012  . Tobacco abuse 03/30/2012   Review of Systems:   Review of Systems  Constitutional: Negative for chills, diaphoresis, fever and malaise/fatigue.  Eyes: Negative for blurred vision, double vision and pain.  Respiratory: Negative for shortness of breath and wheezing.   Cardiovascular: Negative for chest pain, palpitations and leg swelling.  Gastrointestinal: Negative for blood in stool, constipation, diarrhea, heartburn, melena, nausea and vomiting.  Genitourinary: Negative for dysuria, flank pain, frequency and hematuria.  Musculoskeletal: Negative for joint pain and myalgias.  Neurological: Negative for dizziness, tingling, sensory change, focal weakness, weakness and headaches.     Physical Exam:  Vitals:   02/23/19 1539  BP: 132/66  Pulse: 96  Temp: 98.7 F (37.1 C)  TempSrc: Oral  SpO2: 97%  Weight: 231 lb 14.4 oz (105.2 kg)  Height: 5\' 1"  (1.549 m)   Physical Exam Vitals reviewed.  Constitutional:      General: She is not in acute distress.    Appearance: Normal appearance. She is obese. She is not toxic-appearing or diaphoretic.  HENT:     Head: Normocephalic and atraumatic.     Mouth/Throat:     Mouth: Mucous membranes are moist.     Pharynx: Oropharynx is clear.  Eyes:     General: No scleral icterus.    Extraocular Movements: Extraocular movements intact.     Conjunctiva/sclera: Conjunctivae normal.  Cardiovascular:     Rate and Rhythm: Normal rate and regular rhythm.     Pulses: Normal pulses.     Heart  sounds: Normal heart sounds. No murmur. No friction rub. No gallop.   Pulmonary:     Effort: Pulmonary effort is normal.     Breath sounds: Normal breath sounds. No wheezing, rhonchi or rales.  Abdominal:     General: Bowel sounds are normal. There is no distension.     Palpations: Abdomen is soft.     Tenderness: There is no abdominal tenderness.  Musculoskeletal:        General: No swelling or tenderness. Normal range of motion.  Skin:    General: Skin is warm and dry.     Capillary Refill: Capillary refill takes less than 2 seconds.     Findings: No bruising or rash.  Neurological:     General: No focal deficit present.     Mental Status: She is alert and oriented to person, place, and time. Mental status is at baseline.     Sensory: No sensory deficit.     Motor: No weakness.      Assessment & Plan:   See Encounters Tab for problem based charting.  Patient discussed with Dr. 

## 2019-02-23 NOTE — Assessment & Plan Note (Signed)
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 She followed up with gynecologist for colposcopy in 08/2018 which was negative cytology. Recommended for repeat pap in 02/2019. Patient notes currently asymptomatic.   Plan Pap smear in February 2021

## 2019-02-23 NOTE — Assessment & Plan Note (Signed)
Patient notes doing well on Advair. She notes only having to use her albuterol maximum 1x/day depending on activity level.  Plan: Continue Advair  Continue montelukast 10mg  daily Continue albuterol prn

## 2019-02-23 NOTE — Assessment & Plan Note (Signed)
Flu vaccine administered today. iFOBT card provided to patient today and instructed to mail in.

## 2019-02-23 NOTE — Patient Instructions (Addendum)
Crystal Briggs,  It was a pleasure seeing you today for your BP and diabetes follow up.  For your diabetes: Your A1c is 6.5. Great job! Please continue to take your metformin as prescribed and continue with weight loss as you have been doing. For your BP: Continue to take your amlodipine as prescribed.  You received the flu vaccine today.  You also had your diabetes retinal eye exam today.  You have also received the iFOBT card today. Please mail it in at your earliest convenience.    Please contact us if you have any questions. Thank you!

## 2019-02-23 NOTE — Assessment & Plan Note (Addendum)
BP 132/66 today. Patient continued on amlodipine and HCTZ. She denies any chest pain, headaches, dizziness or shortness of breath.  Plan: Continue amlodipine 10mg  daily and HCTZ 25mg  daily BP check at next visit Continue to encourage weight loss

## 2019-02-23 NOTE — Assessment & Plan Note (Signed)
A1c improved to 6.5 today. She is on metformin 1000mg  twice daily. She has had 15lb weight loss. She denies any polyuria, polydipsia or polyphagia. No headaches, dizziness, sensory changes noted.   Plan: Continue to encourage weight loss Metformin 1000mg  twice daily Diabetic retinal eye exam today

## 2019-02-23 NOTE — Assessment & Plan Note (Signed)
Patient reports well controlled with Protonix and Pepsid. On exam, no abdominal tenderness and normoactive bowel sounds.  Plan: Continue protonix 40mg  daily and famotidine 20mg  daily

## 2019-02-23 NOTE — Assessment & Plan Note (Signed)
Patient has had 15lb weight loss with dietary changes. She notes that she doesn't exercise much and just walks around the house or grocery shopping.   Plan:  Encourage continued weight loss

## 2019-02-25 NOTE — Progress Notes (Signed)
Internal Medicine Clinic Attending  Case discussed with Dr. Aslam at the time of the visit.  We reviewed the resident's history and exam and pertinent patient test results.  I agree with the assessment, diagnosis, and plan of care documented in the resident's note.  

## 2019-02-26 ENCOUNTER — Other Ambulatory Visit: Payer: Self-pay | Admitting: Dietician

## 2019-02-26 ENCOUNTER — Encounter (INDEPENDENT_AMBULATORY_CARE_PROVIDER_SITE_OTHER): Payer: Medicaid Other | Admitting: Dietician

## 2019-02-26 DIAGNOSIS — E1165 Type 2 diabetes mellitus with hyperglycemia: Secondary | ICD-10-CM

## 2019-02-26 NOTE — Addendum Note (Signed)
Addended by: Baird Cancer on: 02/26/2019 11:34 AM   Modules accepted: Orders

## 2019-03-17 DIAGNOSIS — Z0000 Encounter for general adult medical examination without abnormal findings: Secondary | ICD-10-CM | POA: Diagnosis not present

## 2019-03-26 ENCOUNTER — Other Ambulatory Visit: Payer: Medicaid Other

## 2019-03-26 ENCOUNTER — Other Ambulatory Visit: Payer: Self-pay

## 2019-03-26 DIAGNOSIS — Z0000 Encounter for general adult medical examination without abnormal findings: Secondary | ICD-10-CM

## 2019-03-27 LAB — FECAL OCCULT BLOOD, IMMUNOCHEMICAL: Fecal Occult Bld: NEGATIVE

## 2019-04-01 ENCOUNTER — Ambulatory Visit: Payer: Medicaid Other | Attending: Internal Medicine

## 2019-04-01 DIAGNOSIS — Z23 Encounter for immunization: Secondary | ICD-10-CM

## 2019-04-01 NOTE — Progress Notes (Signed)
   Covid-19 Vaccination Clinic  Name:  Crystal Briggs    MRN: [id number] DOB: 09/05/1965  04/01/2019  Crystal Briggs was observed post Covid-19 immunization for 15 minutes without incident. She was provided with Vaccine Information Sheet and instruction to access the V-Safe system.   Crystal Briggs was instructed to call 911 with any severe reactions post vaccine: [name] Difficulty breathing  . Swelling of face and throat  . A fast heartbeat  . A bad rash all over body  . Dizziness and weakness   Immunizations Administered    Name Date Dose VIS Date Route   Pfizer COVID-19 Vaccine 04/01/2019 11:12 AM 0.3 mL 12/25/2018 Intramuscular   Manufacturer: [employer], [employer]   Lot: [id number]   NDC: [address]-7

## 2019-04-15 ENCOUNTER — Other Ambulatory Visit: Payer: Self-pay | Admitting: *Deleted

## 2019-04-15 DIAGNOSIS — K2100 Gastro-esophageal reflux disease with esophagitis, without bleeding: Secondary | ICD-10-CM

## 2019-04-15 MED ORDER — AMLODIPINE BESYLATE 10 MG PO TABS: 10.0000 mg | ORAL_TABLET | Freq: Every day | ORAL | 3 refills | Status: DC

## 2019-04-15 MED ORDER — PANTOPRAZOLE SODIUM 40 MG PO TBEC: DELAYED_RELEASE_TABLET | ORAL | 3 refills | Status: DC

## 2019-04-20 ENCOUNTER — Other Ambulatory Visit: Payer: Self-pay | Admitting: Internal Medicine

## 2019-04-26 ENCOUNTER — Ambulatory Visit: Payer: Medicaid Other | Attending: Internal Medicine

## 2019-04-26 DIAGNOSIS — Z23 Encounter for immunization: Secondary | ICD-10-CM

## 2019-04-26 NOTE — Progress Notes (Signed)
   Covid-19 Vaccination Clinic  Name:  SHERENA MACHORRO    MRN: [id number] DOB: 29-Jan-1965  04/26/2019  Ms. Muro was observed post Covid-19 immunization for 15 minutes without incident. She was provided with Vaccine Information Sheet and instruction to access the V-Safe system.   Ms. Conaway was instructed to call 911 with any severe reactions post vaccine: [name] Difficulty breathing  . Swelling of face and throat  . A fast heartbeat  . A bad rash all over body  . Dizziness and weakness   Immunizations Administered    Name Date Dose VIS Date Route   Pfizer COVID-19 Vaccine 04/26/2019 10:53 AM 0.3 mL 12/25/2018 Intramuscular   Manufacturer: [employer], [employer]   Lot: [id number]   NDC: [address]-0

## 2019-05-10 ENCOUNTER — Other Ambulatory Visit: Payer: Self-pay | Admitting: *Deleted

## 2019-05-10 MED ORDER — MONTELUKAST SODIUM 10 MG PO TABS: ORAL_TABLET | ORAL | 3 refills | Status: DC

## 2019-06-15 ENCOUNTER — Other Ambulatory Visit: Payer: Self-pay | Admitting: Internal Medicine

## 2019-06-24 ENCOUNTER — Other Ambulatory Visit: Payer: Self-pay | Admitting: *Deleted

## 2019-06-24 DIAGNOSIS — J4530 Mild persistent asthma, uncomplicated: Secondary | ICD-10-CM

## 2019-06-24 MED ORDER — ALBUTEROL SULFATE HFA 108 (90 BASE) MCG/ACT IN AERS: 1.0000 | INHALATION_SPRAY | Freq: Four times a day (QID) | RESPIRATORY_TRACT | 5 refills | Status: DC | PRN

## 2019-06-27 ENCOUNTER — Other Ambulatory Visit: Payer: Self-pay | Admitting: Internal Medicine

## 2019-06-27 DIAGNOSIS — R682 Dry mouth, unspecified: Secondary | ICD-10-CM

## 2019-06-27 DIAGNOSIS — N393 Stress incontinence (female) (male): Secondary | ICD-10-CM

## 2019-06-27 DIAGNOSIS — E1165 Type 2 diabetes mellitus with hyperglycemia: Secondary | ICD-10-CM

## 2019-06-27 DIAGNOSIS — J4540 Moderate persistent asthma, uncomplicated: Secondary | ICD-10-CM

## 2019-06-28 ENCOUNTER — Other Ambulatory Visit: Payer: Self-pay | Admitting: Internal Medicine

## 2019-06-28 DIAGNOSIS — K219 Gastro-esophageal reflux disease without esophagitis: Secondary | ICD-10-CM

## 2019-06-29 ENCOUNTER — Other Ambulatory Visit: Payer: Self-pay | Admitting: *Deleted

## 2019-06-29 DIAGNOSIS — I10 Essential (primary) hypertension: Secondary | ICD-10-CM

## 2019-06-29 MED ORDER — HYDROCHLOROTHIAZIDE 25 MG PO TABS: 25.0000 mg | ORAL_TABLET | Freq: Every day | ORAL | 3 refills | Status: DC

## 2019-08-17 ENCOUNTER — Other Ambulatory Visit: Payer: Self-pay | Admitting: Internal Medicine

## 2019-08-20 ENCOUNTER — Ambulatory Visit: Payer: Medicaid Other | Admitting: Internal Medicine

## 2019-08-20 ENCOUNTER — Encounter: Payer: Self-pay | Admitting: Internal Medicine

## 2019-08-20 ENCOUNTER — Other Ambulatory Visit: Payer: Self-pay

## 2019-08-20 VITALS — BP 137/79 | HR 90 | Wt 232.9 lb

## 2019-08-20 DIAGNOSIS — R600 Localized edema: Secondary | ICD-10-CM | POA: Diagnosis not present

## 2019-08-20 DIAGNOSIS — E119 Type 2 diabetes mellitus without complications: Secondary | ICD-10-CM | POA: Diagnosis not present

## 2019-08-20 DIAGNOSIS — Z23 Encounter for immunization: Secondary | ICD-10-CM

## 2019-08-20 DIAGNOSIS — I10 Essential (primary) hypertension: Secondary | ICD-10-CM | POA: Diagnosis not present

## 2019-08-20 DIAGNOSIS — J4540 Moderate persistent asthma, uncomplicated: Secondary | ICD-10-CM

## 2019-08-20 DIAGNOSIS — L819 Disorder of pigmentation, unspecified: Secondary | ICD-10-CM

## 2019-08-20 LAB — POCT GLYCOSYLATED HEMOGLOBIN (HGB A1C): Hemoglobin A1C: 6.7 % — AB (ref 4.0–5.6)

## 2019-08-20 LAB — GLUCOSE, CAPILLARY: Glucose-Capillary: 104 mg/dL — ABNORMAL HIGH (ref 70–99)

## 2019-08-20 NOTE — Patient Instructions (Signed)
It was nice seeing you today! Thank you for choosing [facility] for your Primary Care.    Today we talked about:   1. Diabetes: You have been doing great with your diabetes. Continue with Metformin  2. Blood Pressure: No changes to your medication today  3. Pigmented spot on your thigh: I have sent a referral to the dermatologist. We will call you to schedule that.

## 2019-08-20 NOTE — Progress Notes (Addendum)
° °  CC: T2DM and HTN follow up  HPI:  [name] is a 54 y.o. with a PMHx as listed below who presents to the clinic for T2DM and HTN follow up.   Please see the Encounters tab for problem-based Assessment & Plan regarding status of patient's acute and chronic conditions.  Past Medical History:  Diagnosis Date   Asthma, chronic 03/30/2012   Essential hypertension, benign 03/30/2012   Hypertension    Seasonal allergies 03/30/2012   Stress incontinence, female 03/30/2012   Tobacco abuse 03/30/2012   Review of Systems: Review of Systems  Constitutional: Positive for weight loss (intentional). Negative for chills and fever.  Respiratory: Negative for shortness of breath and wheezing.   Cardiovascular: Negative for chest pain.  Gastrointestinal: Negative for abdominal pain, diarrhea, nausea and vomiting.  Neurological: Negative for dizziness, focal weakness, weakness and headaches.   Physical Exam:  Vitals:   08/20/19 1045  BP: 137/79  Pulse: 90  SpO2: 100%  Weight: 232 lb 14.4 oz (105.6 kg)   Physical Exam Vitals and nursing note reviewed.  Constitutional:      General: She is not in acute distress.    Appearance: She is obese.  Cardiovascular:     Rate and Rhythm: Normal rate and regular rhythm.     Heart sounds: No murmur heard.   Pulmonary:     Effort: Pulmonary effort is normal. No respiratory distress.     Breath sounds: Rales (crackles on anterior lung fields, not present on any posterior lung field auscultation) present. No wheezing.  Musculoskeletal:     Right lower leg: No edema.     Left lower leg: No edema.  Skin:    General: Skin is warm and dry.     Comments: Large region of hyperpigmentation that involves the upper one third of the right anterior thigh extending up to but not involving the general region.  Affected area is not raised and has no rashes.  No follicular sparing.  Area of hyperpigmentation is even throughout.  Border is irregular but  well delineated. Woods lamp examination is negative  Neurological:     General: No focal deficit present.     Mental Status: She is alert and oriented to person, place, and time. Mental status is at baseline.  Psychiatric:        Mood and Affect: Mood normal.        Behavior: Behavior normal.    Assessment & Plan:   See Encounters Tab for problem based charting.  Patient discussed with Dr. Antony Contras

## 2019-08-21 LAB — MICROALBUMIN / CREATININE URINE RATIO
Creatinine, Urine: 28.6 mg/dL
Microalb/Creat Ratio: <10 mg/g creat (ref 0–29)
Microalbumin, Urine: <3 ug/mL

## 2019-08-23 DIAGNOSIS — L819 Disorder of pigmentation, unspecified: Secondary | ICD-10-CM | POA: Insufficient documentation

## 2019-08-23 NOTE — Assessment & Plan Note (Signed)
Lab Results  Component Value Date   HGBA1C 6.7 (A) 08/20/2019   Crystal Briggs reports no difficulty in taking her daily metformin.  She is working on her diet and has managed to lose some weight as well.  She denies any episodes of hypoglycemia.   Assessment/plan: Diabetes is very well controlled and A1c is within goal.  No proteinuria noted on urinalysis.  No change in medication regimen at this time.    -Metformin 500 mg twice daily

## 2019-08-23 NOTE — Assessment & Plan Note (Signed)
Ms. Lahue notes a several week history of hyperpigmentation on her right thigh that she feels has gradually enlarged.  She denies any pain, erythema, edema, or itchiness on the involved area.  She denies any previous history of a similar hyperpigmentation.  She does note she has been sitting outside in the sun and assumes it more frequently in the past couple weeks.  She also notes that she recently changed a body wash but did not like how made her skin feel and so she switched back.  She tried to make an appointment with dermatology, but was told she needs a referral.  Assessment/plan: Area of hyperpigmentation is not consistent with tinea and Woods lamp examination was negative.  Inconsistent with skin malignancy, so that is low on the differential.  Most likely photo reaction to sitting outside in the sun.  Reassured patient that this is very likely a benign process.  She would like to proceed with dermatology evaluation.  -Referral to dermatology

## 2019-08-23 NOTE — Assessment & Plan Note (Signed)
Patient denies any lower extremity swelling at this time.  Assessment/plan: Appears resolved at this time.  Continue to monitor as needed

## 2019-08-23 NOTE — Assessment & Plan Note (Signed)
BP Readings from Last 3 Encounters:  08/20/19 137/79  02/23/19 132/66  09/10/18 (!) 153/84   Crystal Briggs states she is able to take her amlodipine, HCTZ daily without any difficulty.  She denies any chest pain or shortness of breath at this time.  Assessment/plan: Blood pressure is stable at this time.  No medication changes planned.  -Continue amlodipine 10 mg daily, HCTZ 25 mg daily

## 2019-08-23 NOTE — Assessment & Plan Note (Signed)
Patient denies any frequent wheezing or shortness of breath, but states that when it occurs symptoms are easily alleviated with albuterol inhaler.  She denies any shortness of breath or cough at night.  Assessment/plan: Well-controlled at this time, continue with current management.  -Advair -Albuterol as needed -Singulair

## 2019-08-24 ENCOUNTER — Telehealth: Payer: Self-pay

## 2019-08-24 NOTE — Telephone Encounter (Signed)
Requesting test results, please call pt back.  

## 2019-08-24 NOTE — Addendum Note (Signed)
Addended by: Burnell Blanks on: 08/24/2019 11:25 AM   Modules accepted: Level of Service

## 2019-08-24 NOTE — Progress Notes (Signed)
Internal Medicine Clinic Attending  Case discussed with Dr. Basaraba  At the time of the visit.  We reviewed the resident's history and exam and pertinent patient test results.  I agree with the assessment, diagnosis, and plan of care documented in the resident's note.  

## 2019-09-11 ENCOUNTER — Other Ambulatory Visit: Payer: Self-pay | Admitting: Internal Medicine

## 2019-09-11 DIAGNOSIS — E1165 Type 2 diabetes mellitus with hyperglycemia: Secondary | ICD-10-CM

## 2019-10-08 ENCOUNTER — Other Ambulatory Visit: Payer: Self-pay | Admitting: Internal Medicine

## 2019-11-22 ENCOUNTER — Other Ambulatory Visit: Payer: Self-pay | Admitting: Obstetrics and Gynecology

## 2019-11-22 DIAGNOSIS — Z0000 Encounter for general adult medical examination without abnormal findings: Secondary | ICD-10-CM

## 2019-11-30 ENCOUNTER — Other Ambulatory Visit: Payer: Self-pay

## 2019-11-30 ENCOUNTER — Other Ambulatory Visit: Payer: Self-pay | Admitting: *Deleted

## 2019-11-30 NOTE — Patient Outreach (Signed)
Care Coordination  11/30/2019  Cameran Ahmed [place] 10-Jul-1965 [id number]   Crystal Briggs was referred to the [facility] Managed Care High Risk team for assistance with care coordination and care management services. Care coordination/care management services as part of the Medicaid benefit was offered to the patient today. The patient declined assistance offered today.   Plan: The Medicaid Managed Care High Risk team is available at any time in the future to assist with care coordination/care management services upon referral.   Estanislado Emms RN, BSN [place]  [facility] RN Care Coordinator

## 2019-11-30 NOTE — Patient Instructions (Signed)
Ms Crist,  Thank you for taking time to speak with me today about care coordination and care management services available to you at no cost as part of your Medicaid benefit. These services are voluntary. Our team is available to provide assistance regarding your health care needs at any time. Please do not hesitate to reach out to me if we can be of service to you at any time in the future. [phone]  Estanislado Emms RN, BSN [place]  [facility] RN Care Coordinator

## 2019-12-24 ENCOUNTER — Other Ambulatory Visit: Payer: Self-pay | Admitting: Internal Medicine

## 2019-12-24 DIAGNOSIS — E1165 Type 2 diabetes mellitus with hyperglycemia: Secondary | ICD-10-CM

## 2019-12-24 DIAGNOSIS — R682 Dry mouth, unspecified: Secondary | ICD-10-CM

## 2019-12-24 DIAGNOSIS — J4540 Moderate persistent asthma, uncomplicated: Secondary | ICD-10-CM

## 2019-12-24 DIAGNOSIS — N393 Stress incontinence (female) (male): Secondary | ICD-10-CM

## 2019-12-29 ENCOUNTER — Ambulatory Visit (INDEPENDENT_AMBULATORY_CARE_PROVIDER_SITE_OTHER): Payer: Medicaid Other | Admitting: Dermatology

## 2019-12-29 ENCOUNTER — Other Ambulatory Visit: Payer: Self-pay

## 2019-12-29 DIAGNOSIS — R21 Rash and other nonspecific skin eruption: Secondary | ICD-10-CM | POA: Diagnosis not present

## 2019-12-29 NOTE — Progress Notes (Signed)
   New Patient Visit  Subjective  Crystal Briggs is a 54 y.o. female who presents for the following: Rash (Right leg x ~ 6 months that is itchy at times and the skin in that area is darker).  The following portions of the chart were reviewed this encounter and updated as appropriate:   Tobacco  Allergies  Meds  Problems  Med Hx  Surg Hx  Fam Hx     Review of Systems:  No other skin or systemic complaints except as noted in HPI or Assessment and Plan.  Objective  Well appearing patient in no apparent distress; mood and affect are within normal limits.  A focused examination was performed including right anterior thigh. Relevant physical exam findings are noted in the Assessment and Plan.  Objective  Right Thigh distal (normal skin), Right Thigh proximal (hyperpigmented skin): Hyperpigmented patch.  Images       Assessment & Plan  Rash (2) Right Thigh distal (normal skin); Right Thigh proximal (hyperpigmented skin)  Skin / nail biopsy - Right Thigh distal (normal skin), Right Thigh proximal (hyperpigmented skin) Type of biopsy: punch   Informed consent: discussed and consent obtained   Timeout: patient name, date of birth, surgical site, and procedure verified   Procedure prep:  Patient was prepped and draped in usual sterile fashion (the patient was cleaned and prepped) Prep type:  Isopropyl alcohol Anesthesia: the lesion was anesthetized in a standard fashion   Anesthetic:  1% lidocaine w/ epinephrine 1-100,000 buffered w/ 8.4% NaHCO3 Punch size:  3 mm Suture size:  4-0 Suture type: nylon   Hemostasis achieved with: suture, pressure and aluminum chloride   Outcome: patient tolerated procedure well   Post-procedure details: sterile dressing applied and wound care instructions given   Dressing type: bandage, petrolatum and pressure dressing    Specimen 1 - Surgical pathology Differential Diagnosis: Lichen Planus vs other  Check Margins: No Normal  skin  Specimen 2 - Surgical pathology Differential Diagnosis: Lichen Planus vs other Check Margins: No Hyperpigmented skin  Return in about 1 week (around 01/05/2020) for Biopsy Follow up.  I, Joanie Coddington, CMA, am acting as scribe for Armida Sans, MD .  Documentation: I have reviewed the above documentation for accuracy and completeness, and I agree with the above.  Armida Sans, MD

## 2019-12-29 NOTE — Patient Instructions (Signed)

## 2019-12-29 NOTE — Progress Notes (Deleted)
   Follow-Up Visit   Subjective  Crystal Briggs is a 54 y.o. female who presents for the following: Rash (Right leg x ~ 6 months that is itchy at times and the skin in that area is darker).  ***  The following portions of the chart were reviewed this encounter and updated as appropriate:       Review of Systems:  No other skin or systemic complaints except as noted in HPI or Assessment and Plan.  Objective  Well appearing patient in no apparent distress; mood and affect are within normal limits.  {Exam:34163::"A full examination was performed including scalp, head, eyes, ears, nose, lips, neck, chest, axillae, abdomen, back, buttocks, bilateral upper extremities, bilateral lower extremities, hands, feet, fingers, toes, fingernails, and toenails. All findings within normal limits unless otherwise noted below."}  Objective  Right Thigh distal (normal skin), Right Thigh proximal (hyperpigmented skin): Hyperpigmented patch.  Images        Assessment & Plan  Rash (2) Right Thigh distal (normal skin); Right Thigh proximal (hyperpigmented skin)  Skin / nail biopsy - Right Thigh distal (normal skin), Right Thigh proximal (hyperpigmented skin) Type of biopsy: punch   Punch size:  3 mm Suture size:  4-0  Specimen 1 - Surgical pathology Differential Diagnosis: Lichen Planus vs other  Check Margins: No Normal skin  Specimen 2 - Surgical pathology Differential Diagnosis: Lichen Planus vs other Check Margins: No Hyperpigmented skin   Return in about 1 week (around 01/05/2020) for Biopsy Follow up.

## 2019-12-31 ENCOUNTER — Ambulatory Visit
Admission: RE | Admit: 2019-12-31 | Discharge: 2019-12-31 | Disposition: A | Discharge status: Home / Self Care | Payer: Medicaid Other | Source: Ambulatory Visit | Attending: Obstetrics and Gynecology | Admitting: Obstetrics and Gynecology

## 2019-12-31 ENCOUNTER — Other Ambulatory Visit: Payer: Self-pay

## 2019-12-31 DIAGNOSIS — Z0000 Encounter for general adult medical examination without abnormal findings: Secondary | ICD-10-CM

## 2020-01-04 ENCOUNTER — Encounter: Payer: Self-pay | Admitting: Dermatology

## 2020-01-05 ENCOUNTER — Ambulatory Visit (INDEPENDENT_AMBULATORY_CARE_PROVIDER_SITE_OTHER): Payer: Medicaid Other | Admitting: Dermatology

## 2020-01-05 ENCOUNTER — Other Ambulatory Visit: Payer: Self-pay

## 2020-01-05 DIAGNOSIS — B09 Unspecified viral infection characterized by skin and mucous membrane lesions: Secondary | ICD-10-CM | POA: Diagnosis not present

## 2020-01-05 MED ORDER — MOMETASONE FUROATE 0.1 % EX CREA: 1.0000 "application " | TOPICAL_CREAM | CUTANEOUS | 3 refills | Status: AC

## 2020-01-05 NOTE — Progress Notes (Signed)
   Follow-Up Visit   Subjective  Crystal Briggs is a 54 y.o. 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  The following portions of the chart were reviewed this encounter and updated as appropriate:   Tobacco  Allergies  Meds  Problems  Med Hx  Surg Hx  Fam Hx     Review of Systems:  No other skin or systemic complaints except as noted in HPI or Assessment and Plan.  Objective  Well appearing patient in no apparent distress; mood and affect are within normal limits.  A focused examination was performed including Right leg. Relevant physical exam findings are noted in the Assessment and Plan.  Objective  R ant thigh: Hyperpigmented patch   Assessment & Plan  Viral exanthem R ant thigh Vs Drug eruption with PIPA bx proven Related to COVID vs Vaccine vs drug? Vs other   Chart reviewed. No medications or other causes determined at this time.  Start Mometasone cream bid x 2wks, then decrease to qd 5d/wk  Wound cleansed, sutures removed, wound cleansed and steri strips applied. Discussed pathology results.   mometasone (ELOCON) 0.1 % cream - R ant thigh  Return in about 3 months (around 04/04/2020) for rash f/u.  I, Ardis Rowan, RMA, am acting as scribe for Armida Sans, MD .  Documentation: I have reviewed the above documentation for accuracy and completeness, and I agree with the above.  Armida Sans, MD

## 2020-01-13 ENCOUNTER — Encounter: Payer: Self-pay | Admitting: Dermatology

## 2020-01-19 ENCOUNTER — Other Ambulatory Visit: Payer: Self-pay | Admitting: Internal Medicine

## 2020-01-19 DIAGNOSIS — E785 Hyperlipidemia, unspecified: Secondary | ICD-10-CM

## 2020-01-19 DIAGNOSIS — K2100 Gastro-esophageal reflux disease with esophagitis, without bleeding: Secondary | ICD-10-CM

## 2020-04-03 ENCOUNTER — Other Ambulatory Visit: Payer: Self-pay | Admitting: Dermatology

## 2020-04-03 ENCOUNTER — Other Ambulatory Visit: Payer: Self-pay

## 2020-04-03 ENCOUNTER — Ambulatory Visit (INDEPENDENT_AMBULATORY_CARE_PROVIDER_SITE_OTHER): Payer: Medicaid Other | Admitting: Dermatology

## 2020-04-03 DIAGNOSIS — R21 Rash and other nonspecific skin eruption: Secondary | ICD-10-CM | POA: Diagnosis not present

## 2020-04-03 NOTE — Progress Notes (Signed)
   Follow-Up Visit   Subjective  Crystal Briggs is a 55 y.o. female who presents for the following: Rash (Bx proven viral exanthem vs drug eruption with PIPA -/Patient continues to use Mometasone 0.1% cream QD to aa right thigh because rash hasn't completely resolved).  The following portions of the chart were reviewed this encounter and updated as appropriate:   Tobacco  Allergies  Meds  Problems  Med Hx  Surg Hx  Fam Hx     Review of Systems:  No other skin or systemic complaints except as noted in HPI or Assessment and Plan.  Objective  Well appearing patient in no apparent distress; mood and affect are within normal limits.  A focused examination was performed including the right thigh. Relevant physical exam findings are noted in the Assessment and Plan.  Objective  R ant thigh: Hyperpigmentation   Assessment & Plan  Rash - see biopsy R ant thigh Bx proven viral exanthem vs drug eruption vs other with PIPA - related to COVID vs vaccine vs drug? Vs other  Discussed with patient that the hyperpigmentation from the inflammation/rash she is experiencing may take a long time to improve or resolve. Chronic and persistent with remaining hyperpigmentation.   Recommend Alpha gal testing.   Continue Mometasone 0.1% cream to aa's QD.   Alpha Gal IgE - R ant thigh  Return in about 2 months (around 06/03/2020).  Maylene Roes, CMA, am acting as scribe for Armida Sans, MD .  Documentation: I have reviewed the above documentation for accuracy and completeness, and I agree with the above.  Armida Sans, MD

## 2020-04-03 NOTE — Patient Instructions (Signed)

## 2020-04-05 ENCOUNTER — Encounter: Payer: Self-pay | Admitting: Dermatology

## 2020-04-07 LAB — ALPHA-GAL PANEL
Allergen Lamb IgE: <0.1 kU/L
Beef IgE: <0.1 kU/L
IgE (Immunoglobulin E), Serum: 19 IU/mL (ref 6–495)
O215-IgE Alpha-Gal: <0.1 kU/L
Pork IgE: <0.1 kU/L

## 2020-04-10 ENCOUNTER — Other Ambulatory Visit: Payer: Self-pay | Admitting: Internal Medicine

## 2020-04-10 ENCOUNTER — Other Ambulatory Visit: Payer: Self-pay | Admitting: Student

## 2020-04-10 ENCOUNTER — Telehealth: Payer: Self-pay

## 2020-04-10 ENCOUNTER — Other Ambulatory Visit: Payer: Self-pay

## 2020-04-10 DIAGNOSIS — L309 Dermatitis, unspecified: Secondary | ICD-10-CM

## 2020-04-10 DIAGNOSIS — N393 Stress incontinence (female) (male): Secondary | ICD-10-CM

## 2020-04-10 DIAGNOSIS — I10 Essential (primary) hypertension: Secondary | ICD-10-CM

## 2020-04-10 DIAGNOSIS — J4540 Moderate persistent asthma, uncomplicated: Secondary | ICD-10-CM

## 2020-04-10 DIAGNOSIS — R682 Dry mouth, unspecified: Secondary | ICD-10-CM

## 2020-04-10 NOTE — Progress Notes (Signed)
Pt referred to Allergist in [address] [place]

## 2020-04-10 NOTE — Telephone Encounter (Signed)
-----   Message from David C Kowalski, MD sent at 04/10/2020 10:32 AM EDT ----- Serum IgE = Very High But no Alpha-Gal and no IgE Ab to meats.  May benefit from Allergist evaluation. Please refer pt for allerist appt. Keep follow up here for 2mos 

## 2020-04-10 NOTE — Telephone Encounter (Signed)
-----   Message from Deirdre Evener, MD sent at 04/10/2020 10:32 AM EDT ----- Serum IgE = Very High But no Alpha-Gal and no IgE Ab to meats.  May benefit from Allergist evaluation. Please refer pt for allerist appt. Keep follow up here for 61mos

## 2020-04-10 NOTE — Telephone Encounter (Signed)
Discussed labs with pt, pt would like to be referred to an allergist in [place].

## 2020-04-10 NOTE — Telephone Encounter (Signed)
LM on VM please call back to discuss lab results

## 2020-04-11 ENCOUNTER — Other Ambulatory Visit: Payer: Self-pay | Admitting: Student

## 2020-04-11 DIAGNOSIS — J4540 Moderate persistent asthma, uncomplicated: Secondary | ICD-10-CM

## 2020-04-11 DIAGNOSIS — I10 Essential (primary) hypertension: Secondary | ICD-10-CM

## 2020-04-11 DIAGNOSIS — R682 Dry mouth, unspecified: Secondary | ICD-10-CM

## 2020-04-11 DIAGNOSIS — N393 Stress incontinence (female) (male): Secondary | ICD-10-CM

## 2020-04-17 ENCOUNTER — Other Ambulatory Visit: Payer: Self-pay

## 2020-04-17 DIAGNOSIS — M25561 Pain in right knee: Secondary | ICD-10-CM

## 2020-04-17 DIAGNOSIS — E1165 Type 2 diabetes mellitus with hyperglycemia: Secondary | ICD-10-CM

## 2020-04-17 MED ORDER — FLUTICASONE PROPIONATE 50 MCG/ACT NA SUSP: 1.0000 | Freq: Every day | NASAL | 5 refills | Status: DC | PRN

## 2020-04-17 MED ORDER — DICLOFENAC SODIUM 1 % EX GEL: 2.0000 g | Freq: Four times a day (QID) | CUTANEOUS | 1 refills | Status: DC

## 2020-04-17 MED ORDER — METFORMIN HCL 500 MG PO TABS: 1.0000 | ORAL_TABLET | Freq: Two times a day (BID) | ORAL | 1 refills | Status: DC

## 2020-04-17 MED ORDER — MONTELUKAST SODIUM 10 MG PO TABS: 10.0000 mg | ORAL_TABLET | Freq: Every day | ORAL | 3 refills | Status: DC

## 2020-04-17 NOTE — Telephone Encounter (Signed)
Requested medications sent to patient pharmacy.

## 2020-04-17 NOTE — Telephone Encounter (Signed)
montelukast (SINGULAIR) 10 MG tablet  fluticasone (FLONASE) 50 MCG/ACT nasal spray  metFORMIN (GLUCOPHAGE) 500 MG tablet  diclofenac sodium (VOLTAREN) 1 % GEL  refill request @  [id number] [place], [place] - [address] AT [place] [address] Phone:  [phone]  Fax:  [phone]

## 2020-04-17 NOTE — Telephone Encounter (Signed)
Patient also requesting diclofenac 1% gel, RN unable to select this from med list for reordering.  Some of these meds have recently been ordered, but MD was not a medicaid provider.  Please resend. Thank you, SChaplin, RN,BSN

## 2020-04-18 NOTE — Telephone Encounter (Signed)
Can you please route to Dr. Mcarthur Rossetti?

## 2020-06-05 ENCOUNTER — Ambulatory Visit (INDEPENDENT_AMBULATORY_CARE_PROVIDER_SITE_OTHER): Payer: Medicaid Other | Admitting: Dermatology

## 2020-06-05 ENCOUNTER — Other Ambulatory Visit: Payer: Self-pay

## 2020-06-05 DIAGNOSIS — R21 Rash and other nonspecific skin eruption: Secondary | ICD-10-CM

## 2020-06-05 MED ORDER — PIMECROLIMUS 1 % EX CREA
TOPICAL_CREAM | Freq: Two times a day (BID) | CUTANEOUS | 6 refills | Status: DC
Start: 2020-06-05 — End: 2020-11-28

## 2020-06-05 NOTE — Progress Notes (Signed)
   Follow-Up Visit   Subjective  Crystal Briggs is a 55 y.o. female who presents for the following: Rash (Bx proven viral exanthem vs drug eruption vs other with PIPA - related to COVID vs vaccine vs drug? Vs other follow up - has not seen allergist yet. Discoloration is about the same and she is still using Mometasone/).  The following portions of the chart were reviewed this encounter and updated as appropriate:   Tobacco  Allergies  Meds  Problems  Med Hx  Surg Hx  Fam Hx     Review of Systems:  No other skin or systemic complaints except as noted in HPI or Assessment and Plan.  Objective  Well appearing patient in no apparent distress; mood and affect are within normal limits.  A focused examination was performed including right thigh. Relevant physical exam findings are noted in the Assessment and Plan.  Objective  Right Thigh - Anterior: Hyuperpigmentation   Assessment & Plan  Rash Right Thigh - Anterior Bx proven viral exanthem vs drug eruption vs other with PIPA - related to COVID vs vaccine vs drug? Vs other Chronic and persistent and improving but to goal. Discussed with patient that the hyperpigmentation from the inflammation/rash she is experiencing may take a long time to improve or resolve. Chronic and persistent with remaining hyperpigmentation.   Discontinue Mometasone and start Elidel cream bid  Lab reviewed: Alpha-gal and IgE are Normal Advised patient she does not need to be seen by allergist at this time - will cancel referral.  pimecrolimus (ELIDEL) 1 % cream - Right Thigh - Anterior  Return in about 6 months (around 12/06/2020).  I, Joanie Coddington, CMA, am acting as scribe for Armida Sans, MD .  Documentation: I have reviewed the above documentation for accuracy and completeness, and I agree with the above.  Armida Sans, MD

## 2020-06-05 NOTE — Patient Instructions (Signed)

## 2020-06-10 ENCOUNTER — Encounter: Payer: Self-pay | Admitting: Dermatology

## 2020-07-18 ENCOUNTER — Encounter: Payer: Self-pay | Admitting: *Deleted

## 2020-08-10 ENCOUNTER — Other Ambulatory Visit: Payer: Self-pay | Admitting: Internal Medicine

## 2020-08-10 DIAGNOSIS — E1165 Type 2 diabetes mellitus with hyperglycemia: Secondary | ICD-10-CM

## 2020-08-31 ENCOUNTER — Other Ambulatory Visit: Payer: Self-pay | Admitting: Internal Medicine

## 2020-09-16 ENCOUNTER — Other Ambulatory Visit: Payer: Self-pay | Admitting: Internal Medicine

## 2020-09-16 DIAGNOSIS — E1165 Type 2 diabetes mellitus with hyperglycemia: Secondary | ICD-10-CM

## 2020-09-19 NOTE — Telephone Encounter (Signed)
Last OV 08/20/19 with 6 month return request   Next Appt has been scheduled  With Internal Medicine (Sadia Aslam, MD)10/16/2020 at  9:45 AM

## 2020-09-21 ENCOUNTER — Other Ambulatory Visit: Payer: Self-pay | Admitting: Internal Medicine

## 2020-09-21 DIAGNOSIS — R682 Dry mouth, unspecified: Secondary | ICD-10-CM

## 2020-09-21 DIAGNOSIS — E1165 Type 2 diabetes mellitus with hyperglycemia: Secondary | ICD-10-CM

## 2020-09-21 DIAGNOSIS — N393 Stress incontinence (female) (male): Secondary | ICD-10-CM

## 2020-09-21 NOTE — Telephone Encounter (Signed)
There are refills on Singulair and Elidel cream - pt was called / informed to call the pharmacy.

## 2020-09-21 NOTE — Telephone Encounter (Signed)
ASPIRIN LOW DOSE 81 MG EC tablet,  montelukast (SINGULAIR) 10 MG tablet,  pimecrolimus (ELIDEL) 1 % cream,  diclofenac Sodium (VOLTAREN) 1 % GEL  Refill request @ [id number] - [place], [place] - [address].

## 2020-09-24 ENCOUNTER — Other Ambulatory Visit: Payer: Self-pay | Admitting: Student

## 2020-09-26 MED ORDER — ASPIRIN 81 MG PO TBEC: 81.0000 mg | DELAYED_RELEASE_TABLET | Freq: Every day | ORAL | 3 refills | Status: DC

## 2020-09-26 MED ORDER — DICLOFENAC SODIUM 1 % EX GEL: 2.0000 g | Freq: Four times a day (QID) | CUTANEOUS | 1 refills | Status: AC

## 2020-10-16 ENCOUNTER — Encounter: Payer: Medicaid Other | Admitting: Internal Medicine

## 2020-10-23 ENCOUNTER — Other Ambulatory Visit: Payer: Self-pay | Admitting: Internal Medicine

## 2020-10-23 ENCOUNTER — Ambulatory Visit: Payer: Medicaid Other | Admitting: Internal Medicine

## 2020-10-23 ENCOUNTER — Encounter: Payer: Self-pay | Admitting: Internal Medicine

## 2020-10-23 VITALS — BP 119/55 | HR 91 | Temp 97.8°F | Wt 238.3 lb

## 2020-10-23 DIAGNOSIS — Z23 Encounter for immunization: Secondary | ICD-10-CM

## 2020-10-23 DIAGNOSIS — E119 Type 2 diabetes mellitus without complications: Secondary | ICD-10-CM | POA: Diagnosis not present

## 2020-10-23 DIAGNOSIS — Z0000 Encounter for general adult medical examination without abnormal findings: Secondary | ICD-10-CM | POA: Diagnosis not present

## 2020-10-23 DIAGNOSIS — I10 Essential (primary) hypertension: Secondary | ICD-10-CM

## 2020-10-23 LAB — POCT GLYCOSYLATED HEMOGLOBIN (HGB A1C): Hemoglobin A1C: 6.5 % — AB (ref 4.0–5.6)

## 2020-10-23 LAB — GLUCOSE, CAPILLARY: Glucose-Capillary: 100 mg/dL — ABNORMAL HIGH (ref 70–99)

## 2020-10-23 MED ORDER — AMLODIPINE-ATORVASTATIN 10-40 MG PO TABS: 1.0000 | ORAL_TABLET | Freq: Every day | ORAL | 1 refills | Status: DC

## 2020-10-23 NOTE — Progress Notes (Signed)
   CC: diabetes follow up  HPI:  Crystal Briggs is a 55 y.o. female with PMHx as stated below presenting for evaluation of her diabetes. She reports that her blood sugars have been well controlled. Denies any hypoglycemic episodes. Please see problem based charting for complete assessment and plan.  Past Medical History:  Diagnosis Date   Asthma, chronic 03/30/2012   Essential hypertension, benign 03/30/2012   Hypertension    Seasonal allergies 03/30/2012   Stress incontinence, female 03/30/2012   Tobacco abuse 03/30/2012   Review of Systems:  Negative except as stated in HPI.  Physical Exam:  Vitals:   10/23/20 1027  BP: (!) 119/55  Pulse: 91  SpO2: 96%  Weight: 238 lb 4.8 oz (108.1 kg)   Physical Exam  Constitutional: Middle aged, obese female. Appears well-developed and well-nourished. No distress.  HENT: Normocephalic and atraumatic, Cardiovascular: Normal rate, regular rhythm, S1 and S2 present, no murmurs, rubs, gallops.  Distal pulses intact Respiratory: No respiratory distress,  Lungs are clear to auscultation bilaterally. Musculoskeletal: Normal bulk and tone.  Skin: Warm and dry.  No rash, erythema, lesions noted. Psychiatric: Normal mood and affect.    Assessment & Plan:   See Encounters Tab for problem based charting.  Patient discussed with Dr. Antony Contras

## 2020-10-23 NOTE — Assessment & Plan Note (Addendum)
HbA1c 6.5 today. Patient is on metformin 500mg  daily and reports medication compliance. Blood glucose meter with average glucose of 91. Lowest 62 and highest 152. Patient is also implementing dietary changes. Denies any episodes of hypoglycemia. A1c is currently at goal   Plan: Continue metformin 500mg  daily Urine microalbumin/cr ratio at this visit F/u HbA1c in 3 month Referral to ophthalmology

## 2020-10-23 NOTE — Patient Instructions (Addendum)
Ms Crystal Briggs,  It was a pleasure seeing you in clinic. Today we discussed:   Diabetes:  We are checking on your A1c at this visit. I will call you with the results.  Hypertension: Your blood presssure is very well controlled at this time. I have sent in for a combination pill of amlodipine and atorvastatin (cholesterol medication) to your pharmacy. Please continue the hyrochlorothiazide as well. If your blood pressure remains well controlled, we may be able to decrease the amount of blood pressure medications you are on.  You received the flu vaccine at this visit. I have also ordered your yearly FIT testing for colon cancer screening.   If you have any questions or concerns, please call our clinic at [phone] between 9am-5pm and after hours call [phone] and ask for the internal medicine resident on call. If you feel you are having a medical emergency please call 911.   Thank you, we look forward to helping you remain healthy!

## 2020-10-23 NOTE — Assessment & Plan Note (Signed)
Flu vaccine administered at this visit FIT testing ordered

## 2020-10-23 NOTE — Assessment & Plan Note (Signed)
BP Readings from Last 3 Encounters:  10/23/20 (!) 119/55  08/20/19 137/79  02/23/19 132/66   Patient is on amlodipine 10mg  daily and HCTZ 25mg  daily. She reports medication adherence. She denies any headaches, vision changes, lightheadedness/dizziness, chest pain or focal weakness. Blood pressure is at goal.  Will adjust to combo pill to minimize pill burden.  Plan: Start amlodipine-atorvastatin 10-40mg  daily Continue HCTZ 25mg  daily

## 2020-10-24 LAB — MICROALBUMIN / CREATININE URINE RATIO
Creatinine, Urine: 90.2 mg/dL
Microalb/Creat Ratio: 38 mg/g creat — ABNORMAL HIGH (ref 0–29)
Microalbumin, Urine: 34.3 ug/mL

## 2020-10-25 ENCOUNTER — Telehealth: Payer: Self-pay

## 2020-10-25 NOTE — Telephone Encounter (Signed)
Requesting test results, please call pt back.  

## 2020-10-26 NOTE — Telephone Encounter (Signed)
Pt rtn call about her lab results. 

## 2020-10-26 NOTE — Telephone Encounter (Signed)
Returned call to patient, left voicemail. If patient calls back,  Please inform her that A1c is improved to 6.5 and recommendation to continue taking metformin daily as prescribed. Follow up in 3 months for repeat A1c.   Thank you!

## 2020-10-26 NOTE — Telephone Encounter (Signed)
Called pt back - informed of Dr Joretta Bachelor response.   "Please inform her that A1c is improved to 6.5 and recommendation to continue taking metformin daily as prescribed. Follow up in 3 months for repeat A1c"  She wants to know about the urine / kidneys? Thanks

## 2020-10-30 NOTE — Addendum Note (Signed)
Addended by: Burnell Blanks on: 10/30/2020 01:02 PM   Modules accepted: Level of Service

## 2020-10-30 NOTE — Progress Notes (Signed)
Internal Medicine Clinic Attending ° °Case discussed with Dr. Aslam  At the time of the visit.  We reviewed the resident’s history and exam and pertinent patient test results.  I agree with the assessment, diagnosis, and plan of care documented in the resident’s note.  °

## 2020-11-28 ENCOUNTER — Ambulatory Visit (INDEPENDENT_AMBULATORY_CARE_PROVIDER_SITE_OTHER): Payer: Medicaid Other | Admitting: Dermatology

## 2020-11-28 ENCOUNTER — Other Ambulatory Visit: Payer: Self-pay

## 2020-11-28 ENCOUNTER — Other Ambulatory Visit: Payer: Self-pay | Admitting: Internal Medicine

## 2020-11-28 DIAGNOSIS — L988 Other specified disorders of the skin and subcutaneous tissue: Secondary | ICD-10-CM

## 2020-11-28 DIAGNOSIS — R21 Rash and other nonspecific skin eruption: Secondary | ICD-10-CM | POA: Diagnosis not present

## 2020-11-28 MED ORDER — PIMECROLIMUS 1 % EX CREA: TOPICAL_CREAM | Freq: Two times a day (BID) | CUTANEOUS | 6 refills | Status: DC

## 2020-11-28 NOTE — Progress Notes (Signed)
   Follow-Up Visit   Subjective  Crystal Briggs is a 55 y.o. female who presents for the following: Follow-up (6 mo f/u for Bx proven viral exanthem vs drug eruption vs other with PIPA - related to COVID vs vaccine vs drug? Vs other. Pt reports that it does seem to be clearing up. Pt is treating with elidel BID. ).  The following portions of the chart were reviewed this encounter and updated as appropriate:  Tobacco  Allergies  Meds  Problems  Med Hx  Surg Hx  Fam Hx     Review of Systems: No other skin or systemic complaints except as noted in HPI or Assessment and Plan.  Objective  Well appearing patient in no apparent distress; mood and affect are within normal limits.  A focused examination was performed including right leg and face. Relevant physical exam findings are noted in the Assessment and Plan.  Right Thigh - Anterior Hyperpigmentation     B/l under eyes Rhytides and volume loss.    Assessment & Plan  Rash Right Thigh - Anterior Bx proven viral exanthem vs drug eruption vs other with PIPA - related to COVID vs vaccine vs drug? Vs other Chronic and persistent and improving but not to goal. Continue Elidel cream BID.  Reviewed photo from 04/03/20 visit.   Related Medications pimecrolimus (ELIDEL) 1 % cream Apply topically 2 (two) times daily.  Elastosis of skin B/l under eyes Discussed that treatment would be a blepharoplasty that would need to be done by an ophthalmologist, plastic surgeon or a oculo- plastic surgeon.  Would recommend Crystal Briggs or Crystal Briggs at [facility] or Dr. Delynn Briggs at [facility].   Return in about 6 months (around 05/28/2021) for rash f/u.  Crystal Briggs, CMA, am acting as scribe for Armida Sans, MD. Documentation: I have reviewed the above documentation for accuracy and completeness, and I agree with the above.  Armida Sans, MD

## 2020-11-28 NOTE — Patient Instructions (Addendum)
Would need a Blepharoplasty to treat under eyes.  Recommend Amy [facility] - [facility]   If you have any questions or concerns for your doctor, please call our main line at [phone] and press option 4 to reach your doctor's medical assistant. If no one answers, please leave a voicemail as directed and we will return your call as soon as possible. Messages left after 4 pm will be answered the following business day.   You may also send [place] a message via MyChart. We typically respond to MyChart messages within 1-2 business days.  For prescription refills, please ask your pharmacy to contact our office. Our fax number is [phone].  If you have an urgent issue when the clinic is closed that cannot wait until the next business day, you can page your doctor at the number below.    Please note that while we do our best to be available for urgent issues outside of office hours, we are not available 24/7.   If you have an urgent issue and are unable to reach [place], you may choose to seek medical care at your doctor's office, retail clinic, urgent care center, or emergency room.  If you have a medical emergency, please immediately call 911 or go to the emergency department.  Pager Numbers  - Dr. Gwen Pounds: [phone]  - Dr. Neale Burly: [phone]  - Dr. Roseanne Reno: [phone]  In the event of inclement weather, please call our main line at [phone] for an update on the status of any delays or closures.  Dermatology Medication Tips: Please keep the boxes that topical medications come in in order to help keep track of the instructions about where and how to use these. Pharmacies typically print the medication instructions only on the boxes and not directly on the medication tubes.   If your medication is too expensive, please contact our office at [phone] option 4 or send [place] a message through MyChart.   We are unable to tell what your  co-pay for medications will be in advance as this is different depending on your insurance coverage. However, we may be able to find a substitute medication at lower cost or fill out paperwork to get insurance to cover a needed medication.   If a prior authorization is required to get your medication covered by your insurance company, please allow [place] 1-2 business days to complete this process.  Drug prices often vary depending on where the prescription is filled and some pharmacies may offer cheaper prices.  The website www.goodrx.com contains coupons for medications through different pharmacies. The prices here do not account for what the cost may be with help from insurance (it may be cheaper with your insurance), but the website can give you the price if you did not use any insurance.  - You can print the associated coupon and take it with your prescription to the pharmacy.  - You may also stop by our office during regular business hours and pick up a GoodRx coupon card.  - If you need your prescription sent electronically to a different pharmacy, notify our office through [facility] or by phone at [phone] option 4.

## 2020-12-03 ENCOUNTER — Encounter: Payer: Self-pay | Admitting: Dermatology

## 2020-12-17 ENCOUNTER — Other Ambulatory Visit: Payer: Self-pay | Admitting: Student

## 2020-12-17 DIAGNOSIS — K2100 Gastro-esophageal reflux disease with esophagitis, without bleeding: Secondary | ICD-10-CM

## 2020-12-17 DIAGNOSIS — E785 Hyperlipidemia, unspecified: Secondary | ICD-10-CM

## 2020-12-24 ENCOUNTER — Other Ambulatory Visit: Payer: Self-pay | Admitting: Internal Medicine

## 2020-12-24 DIAGNOSIS — J4540 Moderate persistent asthma, uncomplicated: Secondary | ICD-10-CM

## 2020-12-24 DIAGNOSIS — J4530 Mild persistent asthma, uncomplicated: Secondary | ICD-10-CM

## 2021-02-04 ENCOUNTER — Other Ambulatory Visit: Payer: Self-pay | Admitting: Internal Medicine

## 2021-02-04 DIAGNOSIS — E1165 Type 2 diabetes mellitus with hyperglycemia: Secondary | ICD-10-CM

## 2021-02-07 ENCOUNTER — Other Ambulatory Visit: Payer: Self-pay

## 2021-02-07 DIAGNOSIS — I10 Essential (primary) hypertension: Secondary | ICD-10-CM

## 2021-02-08 ENCOUNTER — Other Ambulatory Visit: Payer: Self-pay | Admitting: Student

## 2021-02-08 DIAGNOSIS — I10 Essential (primary) hypertension: Secondary | ICD-10-CM

## 2021-02-08 MED ORDER — AMLODIPINE-ATORVASTATIN 10-40 MG PO TABS: 1.0000 | ORAL_TABLET | Freq: Every day | ORAL | 1 refills | Status: DC

## 2021-02-10 ENCOUNTER — Other Ambulatory Visit: Payer: Self-pay | Admitting: Internal Medicine

## 2021-02-10 DIAGNOSIS — E1165 Type 2 diabetes mellitus with hyperglycemia: Secondary | ICD-10-CM

## 2021-02-12 NOTE — Telephone Encounter (Signed)
Next appt scheduled 03/07/21 with PCP.

## 2021-02-16 ENCOUNTER — Other Ambulatory Visit: Payer: Self-pay | Admitting: Internal Medicine

## 2021-02-16 DIAGNOSIS — Z1231 Encounter for screening mammogram for malignant neoplasm of breast: Secondary | ICD-10-CM

## 2021-03-03 ENCOUNTER — Other Ambulatory Visit: Payer: Self-pay | Admitting: Internal Medicine

## 2021-03-03 DIAGNOSIS — I10 Essential (primary) hypertension: Secondary | ICD-10-CM

## 2021-03-03 DIAGNOSIS — R682 Dry mouth, unspecified: Secondary | ICD-10-CM

## 2021-03-03 DIAGNOSIS — N393 Stress incontinence (female) (male): Secondary | ICD-10-CM

## 2021-03-05 NOTE — Telephone Encounter (Signed)
Amlodipine was discontinued now a combination medication.

## 2021-03-07 ENCOUNTER — Ambulatory Visit: Payer: Medicaid Other | Admitting: Internal Medicine

## 2021-03-07 ENCOUNTER — Encounter: Payer: Self-pay | Admitting: Internal Medicine

## 2021-03-07 VITALS — BP 126/65 | HR 104 | Ht 61.0 in | Wt 235.0 lb

## 2021-03-07 DIAGNOSIS — I10 Essential (primary) hypertension: Secondary | ICD-10-CM | POA: Diagnosis not present

## 2021-03-07 DIAGNOSIS — E785 Hyperlipidemia, unspecified: Secondary | ICD-10-CM | POA: Diagnosis not present

## 2021-03-07 DIAGNOSIS — R87810 Cervical high risk human papillomavirus (HPV) DNA test positive: Secondary | ICD-10-CM | POA: Diagnosis not present

## 2021-03-07 DIAGNOSIS — Z0000 Encounter for general adult medical examination without abnormal findings: Secondary | ICD-10-CM

## 2021-03-07 DIAGNOSIS — Z23 Encounter for immunization: Secondary | ICD-10-CM | POA: Diagnosis not present

## 2021-03-07 DIAGNOSIS — E119 Type 2 diabetes mellitus without complications: Secondary | ICD-10-CM | POA: Diagnosis not present

## 2021-03-07 LAB — POCT GLYCOSYLATED HEMOGLOBIN (HGB A1C): Hemoglobin A1C: 6.5 % — AB (ref 4.0–5.6)

## 2021-03-07 LAB — GLUCOSE, CAPILLARY: Glucose-Capillary: 123 mg/dL — ABNORMAL HIGH (ref 70–99)

## 2021-03-07 MED ORDER — AMLODIPINE-ATORVASTATIN 10-40 MG PO TABS: 1.0000 | ORAL_TABLET | Freq: Every day | ORAL | 1 refills | Status: DC

## 2021-03-07 NOTE — Assessment & Plan Note (Signed)
Tdap administered at this visit Advised for shingles vaccine at pharmacy Patient advised to follow up mammogram and FIT testing

## 2021-03-07 NOTE — Patient Instructions (Addendum)
Ms Crystal Briggs,  It was a pleasure seeing you in clinic. Today we discussed:   Diabetes: Your A1c is 6.5 today. Great job! Continue metformin 500mg  twice daily as prescribed.   Hypertension: Continue your amlodipine-atorvastatin combination and hydrochlorothiazide daily.  You received your Tdap vaccine at this visit. You may get your shingles vaccine from your local pharmacy.   I am checking some blood work today. I will call you with any abnormal results.  Plan for pap smear at your next visit.    If you have any questions or concerns, please call our clinic at [phone] between 9am-5pm and after hours call [phone] and ask for the internal medicine resident on call. If you feel you are having a medical emergency please call 911.   Thank you, we look forward to helping you remain healthy!

## 2021-03-07 NOTE — Assessment & Plan Note (Signed)
HbA1c stable at 6.5 today. Patient is continued on metformin 500mg  twice daily. Blood glucose monitor with average glucose of 108 with lowest 78 and highest 203. Patient denies any hypoglycemic episodes. A1c currently at goal  Plan: Continue metformin 500mg  bid Follow up A1c in 3 months

## 2021-03-07 NOTE — Assessment & Plan Note (Signed)
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 Recommended for repeat Pap testing in 2021 which she hasn't had yet. Discussed repeat pap smear today; however, would like to defer at this time.   Plan: Pap smear at next visit

## 2021-03-07 NOTE — Assessment & Plan Note (Signed)
Patient is on atorvastatin 40mg  daily and aspirin 81mg  daily. Reports medication compliance.  Plan: Lipid panel today Refilled amlodipine-atorvastatin 40mg  daily

## 2021-03-07 NOTE — Progress Notes (Signed)
° °  CC: hypertension and diabetes follow up   HPI:  [name] is a 56 y.o. female with PMHx as stated below presenting for follow up of her hypertension and diabetes. No acute concerns today. Please see problem based charting for complete assessment and plan.  Past Medical History:  Diagnosis Date   Asthma, chronic 03/30/2012   Essential hypertension, benign 03/30/2012   Hypertension    Seasonal allergies 03/30/2012   Stress incontinence, female 03/30/2012   Tobacco abuse 03/30/2012   Review of Systems:  Negative except as stated in HPI.  Physical Exam:  Vitals:   03/07/21 1022  BP: 126/65  Pulse: (!) 104  SpO2: 96%  Weight: 235 lb (106.6 kg)  Height: 5\' 1"  (1.549 m)   Physical Exam  Constitutional: Middle aged female, no acute distress  HENT: Normocephalic and atraumatic, EOMI, moist mucous membranes Cardiovascular: Normal rate, regular rhythm, S1 and S2 present, no murmurs, rubs, gallops.  Distal pulses intact Respiratory: No respiratory distress, Lungs are clear to auscultation bilaterally. Musculoskeletal: Normal bulk and tone.  Neurological: Is alert and oriented x4, no apparent focal deficits noted. Skin: Warm and dry.  No rash, erythema, lesions noted. Psychiatric: Normal mood and affect.   Assessment & Plan:   See Encounters Tab for problem based charting.  Patient discussed with Dr.  

## 2021-03-07 NOTE — Assessment & Plan Note (Signed)
BP Readings from Last 3 Encounters:  03/07/21 126/65  10/23/20 (!) 119/55  08/20/19 137/79   Patient is on amlodipine 10mg  daily and HCTZ 25mg  daily. Reports medication adherence and denies any symptoms at this time including headache, lightheadedness/dizziness, vision changes, chest pain, shortness of breath or weakness. Blood pressure is at goal.   Plan: Continue amlodipine-atorvastatin 10-40mg  daily Continue HCTZ 25mg  daily BMP today  Follow up in 3 months for BP check

## 2021-03-08 ENCOUNTER — Ambulatory Visit
Admission: RE | Admit: 2021-03-08 | Discharge: 2021-03-08 | Disposition: A | Discharge status: Home / Self Care | Payer: Medicaid Other | Source: Ambulatory Visit | Attending: Obstetrics and Gynecology | Admitting: Obstetrics and Gynecology

## 2021-03-08 ENCOUNTER — Other Ambulatory Visit: Payer: Self-pay

## 2021-03-08 DIAGNOSIS — Z1231 Encounter for screening mammogram for malignant neoplasm of breast: Secondary | ICD-10-CM

## 2021-03-08 LAB — BMP8+ANION GAP
Anion Gap: 16 mmol/L (ref 10.0–18.0)
BUN/Creatinine Ratio: 18 (ref 9–23)
BUN: 10 mg/dL (ref 6–24)
CO2: 26 mmol/L (ref 20–29)
Calcium: 9.5 mg/dL (ref 8.7–10.2)
Chloride: 96 mmol/L (ref 96–106)
Creatinine, Ser: 0.55 mg/dL — ABNORMAL LOW (ref 0.57–1.00)
Glucose: 123 mg/dL — ABNORMAL HIGH (ref 70–99)
Potassium: 3.9 mmol/L (ref 3.5–5.2)
Sodium: 138 mmol/L (ref 134–144)
eGFR: 108 mL/min/{1.73_m2} (ref >59)

## 2021-03-08 LAB — LIPID PANEL
Chol/HDL Ratio: 4.3 ratio (ref 0.0–4.4)
Cholesterol, Total: 188 mg/dL (ref 100–199)
HDL: 44 mg/dL (ref >39)
LDL Chol Calc (NIH): 124 mg/dL — ABNORMAL HIGH (ref 0–99)
Triglycerides: 113 mg/dL (ref 0–149)
VLDL Cholesterol Cal: 20 mg/dL (ref 5–40)

## 2021-03-09 NOTE — Progress Notes (Signed)
Internal Medicine Clinic Attending ° °Case discussed with Dr. Aslam  At the time of the visit.  We reviewed the resident’s history and exam and pertinent patient test results.  I agree with the assessment, diagnosis, and plan of care documented in the resident’s note.  °

## 2021-04-12 ENCOUNTER — Other Ambulatory Visit: Payer: Self-pay | Admitting: Internal Medicine

## 2021-04-23 ENCOUNTER — Other Ambulatory Visit: Payer: Self-pay | Admitting: Internal Medicine

## 2021-04-23 DIAGNOSIS — J4540 Moderate persistent asthma, uncomplicated: Secondary | ICD-10-CM

## 2021-04-25 ENCOUNTER — Other Ambulatory Visit: Payer: Self-pay

## 2021-04-25 DIAGNOSIS — I10 Essential (primary) hypertension: Secondary | ICD-10-CM

## 2021-04-26 ENCOUNTER — Other Ambulatory Visit: Payer: Self-pay | Admitting: Internal Medicine

## 2021-04-26 DIAGNOSIS — I10 Essential (primary) hypertension: Secondary | ICD-10-CM

## 2021-04-26 MED ORDER — AMLODIPINE-ATORVASTATIN 10-40 MG PO TABS: 1.0000 | ORAL_TABLET | Freq: Every day | ORAL | 3 refills | Status: DC

## 2021-04-27 ENCOUNTER — Other Ambulatory Visit: Payer: Self-pay | Admitting: Student

## 2021-04-27 DIAGNOSIS — I10 Essential (primary) hypertension: Secondary | ICD-10-CM

## 2021-04-27 MED ORDER — AMLODIPINE-ATORVASTATIN 10-40 MG PO TABS: 1.0000 | ORAL_TABLET | Freq: Every day | ORAL | 3 refills | Status: DC

## 2021-04-27 NOTE — Progress Notes (Signed)
Ms. Goforth called the after hours number for a refill on her amlodipine-atorvastatin combination. [facility] [place] [place] [facility]. . ?

## 2021-05-01 ENCOUNTER — Telehealth: Payer: Self-pay

## 2021-05-01 MED ORDER — ATORVASTATIN CALCIUM 40 MG PO TABS: 40.0000 mg | ORAL_TABLET | Freq: Every day | ORAL | 3 refills | Status: DC

## 2021-05-01 MED ORDER — AMLODIPINE BESYLATE 10 MG PO TABS: 10.0000 mg | ORAL_TABLET | Freq: Every day | ORAL | 3 refills | Status: DC

## 2021-05-01 NOTE — Telephone Encounter (Signed)
Requesting to speak with a nurse about getting medication refill on amLODipine-atorvastatin (CADUET) 10-40 MG tablet. States the pharmacy is waiting to hear back from the clinic. Please call pt or the pharmacy back.  ?

## 2021-05-01 NOTE — Telephone Encounter (Signed)
[facility], pharmacist, who stated insurance no longer cover the combo pill Amlodipine-Atorvastatin. The doctor can send in 2 separate rxs. ? ?I called pt back - informed of the above. ? ?

## 2021-05-01 NOTE — Telephone Encounter (Signed)
Rx for amlodipine 10mg  daily and atorvastatin 40mg  [facility]  ?

## 2021-05-23 ENCOUNTER — Telehealth: Payer: Self-pay

## 2021-05-23 NOTE — Telephone Encounter (Signed)
Thank you :)

## 2021-05-23 NOTE — Telephone Encounter (Signed)
Please call pt back about inhaler.  ?

## 2021-05-23 NOTE — Telephone Encounter (Signed)
Pt stated the pharmacy will give her the ProAir inhaler which works very well for her. ?[facility] - stated [facility]Air is no longer available; Ventolin has been substituted ; last refilled 5 days ago. ?I called pt back - informed ProAir is no longer available and you have been using Ventolin instead. She stated it does not work as well and wanting something else. Offered her an appt - she already has an appt 5/30 -she stated she will wait until her appt to discuss this with the doctor. ?

## 2021-06-06 ENCOUNTER — Ambulatory Visit (INDEPENDENT_AMBULATORY_CARE_PROVIDER_SITE_OTHER): Payer: Medicaid Other | Admitting: Dermatology

## 2021-06-06 DIAGNOSIS — R21 Rash and other nonspecific skin eruption: Secondary | ICD-10-CM

## 2021-06-06 DIAGNOSIS — L818 Other specified disorders of pigmentation: Secondary | ICD-10-CM | POA: Diagnosis not present

## 2021-06-06 DIAGNOSIS — L819 Disorder of pigmentation, unspecified: Secondary | ICD-10-CM

## 2021-06-06 MED ORDER — PIMECROLIMUS 1 % EX CREA: TOPICAL_CREAM | Freq: Two times a day (BID) | CUTANEOUS | 6 refills | Status: DC

## 2021-06-06 MED ORDER — HYDROQUINONE 4 % EX CREA
TOPICAL_CREAM | CUTANEOUS | 2 refills | Status: DC
Start: 2021-06-06 — End: 2021-07-18

## 2021-06-06 NOTE — Patient Instructions (Signed)

## 2021-06-06 NOTE — Progress Notes (Signed)
   Follow-Up Visit   Subjective  Crystal Briggs is a 56 y.o. female who presents for the following: Follow-up (6 months f/u right leg for Bx proven viral exanthem vs drug eruption vs other with PIPA - related to COVID vs vaccine vs drug? Vs other. Pt reports that it does seem to be clearing up. Pt is treating with elidel BID. )./ Shanda Bumps with Elidel cream with a fair response )  The following portions of the chart were reviewed this encounter and updated as appropriate:   Tobacco  Allergies  Meds  Problems  Med Hx  Surg Hx  Fam Hx     Review of Systems:  No other skin or systemic complaints except as noted in HPI or Assessment and Plan.  Objective  Well appearing patient in no apparent distress; mood and affect are within normal limits.  A focused examination was performed including bilateral legs. Relevant physical exam findings are noted in the Assessment and Plan.  Right Thigh - Anterior Hyperpigmentation    Assessment & Plan  Rash with postinflammatory pigment alteration/dyschromia/hyperpigmentation Right Thigh - Anterior Bx proven viral exanthem vs drug eruption vs other with PIPA - related to COVID vs vaccine vs drug? Vs other Chronic and persistent and improving but not to goal.    Continue Elidel cream BID.  Start Hydroquinone 4% cream apply to discolored areas daily x 30 months -if Rx not covered by insurance, consider skin medicinals product.   Related Medications pimecrolimus (ELIDEL) 1 % cream Apply topically 2 (two) times daily.  Return in about 6 months (around 12/07/2021) for rash in 6-12 months .  IAngelique Holm, CMA, am acting as scribe for Armida Sans, MD .  Documentation: I have reviewed the above documentation for accuracy and completeness, and I agree with the above.  Armida Sans, MD

## 2021-06-08 ENCOUNTER — Other Ambulatory Visit: Payer: Self-pay | Admitting: Internal Medicine

## 2021-06-08 DIAGNOSIS — J4530 Mild persistent asthma, uncomplicated: Secondary | ICD-10-CM

## 2021-06-11 ENCOUNTER — Encounter: Payer: Self-pay | Admitting: Dermatology

## 2021-06-12 ENCOUNTER — Encounter: Payer: Medicaid Other | Admitting: Internal Medicine

## 2021-06-19 ENCOUNTER — Encounter: Payer: Medicaid Other | Admitting: Student

## 2021-06-26 ENCOUNTER — Encounter: Payer: Medicaid Other | Admitting: Internal Medicine

## 2021-06-27 ENCOUNTER — Encounter: Payer: Self-pay | Admitting: Internal Medicine

## 2021-07-16 NOTE — Progress Notes (Addendum)
Subjective:  CC: pap smear  HPI:  Crystal Briggs is a 56 y.o. female with a past medical history stated below and presents today for Pap smear.  She has alos had worsening dyspnea over the last 3 weeks requiring increased frequency of rescue inhaler useage. Please see problem based assessment and plan for additional details.  Past Medical History:  Diagnosis Date   Asthma, chronic 03/30/2012   Essential hypertension, benign 03/30/2012   Hypertension    Seasonal allergies 03/30/2012   Stress incontinence, female 03/30/2012   Tobacco abuse 03/30/2012    Current Outpatient Medications on File Prior to Visit  Medication Sig Dispense Refill   Accu-Chek FastClix Lancets MISC CHECK BLOOD SUGAR 1 TIMES A DAY 102 each 6   amLODipine (NORVASC) 10 MG tablet Take 1 tablet (10 mg total) by mouth daily. 90 tablet 3   atorvastatin (LIPITOR) 40 MG tablet Take 1 tablet (40 mg total) by mouth daily. 90 tablet 3   cetirizine (ZYRTEC) 10 MG tablet TAKE 1 TABLET BY MOUTH EVERY DAY 90 tablet 1   diclofenac Sodium (VOLTAREN) 1 % GEL Apply 2 g topically 4 (four) times daily. 100 g 1   famotidine (PEPCID) 20 MG tablet TAKE 1 TABLET BY MOUTH EVERYDAY AT BEDTIME 90 tablet 1   fluticasone (FLONASE) 50 MCG/ACT nasal spray PLACE 1 SPRAY INTO BOTH NOSTRILS DAILY AS NEEDED FOR ALLERGIES OR RHINITIS. 16 mL 5   glucose blood (ACCU-CHEK GUIDE) test strip CHECK BLOOD SUGAR TWICE A DAY 50 strip 25   metFORMIN (GLUCOPHAGE) 500 MG tablet TAKE 1 TABLET BY MOUTH 2 TIMES DAILY WITH A MEAL. 180 tablet 1   mometasone (ELOCON) 0.1 % cream Apply 1 application topically as directed. Bid to aa Right thigh for 2 weeks, then decrease to qd 5 days a week 50 g 3   montelukast (SINGULAIR) 10 MG tablet Take 1 tablet (10 mg total) by mouth at bedtime. 90 tablet 3   montelukast (SINGULAIR) 10 MG tablet TAKE 1 TABLET BY MOUTH EVERYDAY AT BEDTIME 90 tablet 3   oxybutynin (DITROPAN-XL) 5 MG 24 hr tablet TAKE 1 TABLET BY MOUTH TWICE A  DAY 180 tablet 1   pantoprazole (PROTONIX) 40 MG tablet TAKE 1 TABLET BY MOUTH EVERY DAY BEFORE BREAKFAST 90 tablet 3   pimecrolimus (ELIDEL) 1 % cream Apply topically 2 (two) times daily. 100 g 6   polyethylene glycol (MIRALAX / GLYCOLAX) packet TAKE 17 GRAMS MIXED IN LIQUID EVERY DAY 28 packet 0   PROAIR HFA 108 (90 Base) MCG/ACT inhaler INHALE 1-2 PUFFS INTO THE LUNGS EVERY 6 (SIX) HOURS AS NEEDED FOR SHORTNESS OF BREATH. 8.5 each 5   No current facility-administered medications on file prior to visit.    No family history on file.  Social History   Socioeconomic History   Marital status: Single    Spouse name: Not on file   Number of children: Not on file   Years of education: Not on file   Highest education level: Not on file  Occupational History   Not on file  Tobacco Use   Smoking status: Every Day    Packs/day: 0.75    Years: 20.00    Total pack years: 15.00    Types: Cigarettes   Smokeless tobacco: Never   Tobacco comments:    12 cigs per day  Substance and Sexual Activity   Alcohol use: No    Alcohol/week: 0.0 standard drinks of alcohol   Drug use: No  Sexual activity: Yes  Other Topics Concern   Not on file  Social History Narrative   Not on file   Social Determinants of Health   Financial Resource Strain: Not on file  Food Insecurity: Not on file  Transportation Needs: Not on file  Physical Activity: Not on file  Stress: Not on file  Social Connections: Not on file  Intimate Partner Violence: Not on file    Review of Systems: ROS negative except for what is noted on the assessment and plan.  Objective:   Vitals:   07/18/21 0900 07/18/21 0921 07/18/21 0922  BP: (!) 151/68 (!) 170/112 (!) 148/66  Pulse: 94 90 89  Temp: 98.2 F (36.8 C)    TempSrc: Oral    SpO2: 93%    Weight: 238 lb (108 kg)    Height: '5\' 1"'  (1.549 m)      Physical Exam: Constitutional: well-appearing, sitting in chair Neck: no JVD Cardiovascular: regular rate and  rhythm, no m/r/g Pulmonary/Chest: normal work of breathing on room air, lungs clear to auscultation bilaterally Abdominal: soft, non-tender, non-distended MSK: normal bulk and tone Skin: warm and dry GU: no erythema or discharge present of external genitalia, dry, atrophied appearing vaginal mucosa and cervix.  no discharge or blood noted in vaginal vault.  Pap smear obtained.  Female nursing staff present during patient's pelvic examination    Assessment & Plan:  Type 2 diabetes mellitus ([place]) Patient presents with history of well-controlled type 2 diabetes.  Hemoglobin A1c is stable at 6.5 today.  She reports adherence to metformin 500 mg twice daily.  She has continued to check her blood sugar.  Today we talked about no longer needing to check blood sugar as she is on a low risk medication with metformin. A/P: Continue metformin 500 mg twice daily HgbA1c has been stable for last year, follow-up A1c in 6 months.  Essential hypertension, benign BP Readings from Last 3 Encounters:  07/18/21 (!) 148/66  03/07/21 126/65  10/23/20 (!) 119/55   Patient currently takes amlodipine 10 mg daily and hydrochlorothiazide 25 mg daily.  She reports adherence with this medication.  Blood pressure is elevated at 151/68 to 148/66 when this was rechecked.  She has a blood pressure cuff at home but has not checked it recently.  She denies headache, vision changes, and chest pain. A/P: Patient presents with stage II hypertension.  Risk factors include obesity with BMI of 44 and sedentary lifestyle. -Start benazepril-hydrochlorothiazide 20-12.5 mg daily -Continue amlodipine 10 mg daily -Referral placed to exercise program -Follow-up in 4 weeks for blood pressure recheck and BMP.  Would consider titrating combination pill if blood pressure remains uncontrolled at that time.  Addendum 7/5: Received message from pharmacy that Arva Chafe is not on formulary for insurance. -Updated medication list and  sent in lisinopril-hydrochlorothiazide 20-12.5 mg  Severe obesity (BMI >= 40) (HCC) Patient with BMI of 44.  Weight loss would also help with blood pressure and diabetes control.  -Provider referral exercise program  Cervical high risk HPV (human papillomavirus) test positive Patient with history of high risk HPV on Pap smear in February 2020.  Colposcopy was done in August of that year with negative cytology.  She is here today to repeat Pap smear.  She is not currently sexually active and declines STI testing at this time. A/P: -Pap smear with HPV testing  Addendum 7/12: Pap was negative for HPV and negative cytology. She will need follow-up PAP in 3 years per ASCCP guidelines.  Female nursing staff present during patient's pelvic examination  Asthma, chronic Patient presents with several weeks of increased shortness of breath.  PFT in 2017 consistent with reactive airway disease.  She states that her symptoms typically worsen in the summer months.  She has been using Advair inhaler once daily with albuterol about twice daily.  Albuterol inhaler does relieve her symptoms for short period of time.  She is a current smoker, smokes about 1/2 pack for the last 20+ years.  She denies increased coughing, sputum production, PND, and orthopnea. VS: Oxygen saturation of 94% On physical exam she has normal inspiratory effort on room air, no wheezing or rales present in lungs bilaterally, no JVD, no edema in lower extremities bilaterally. A: Her physical exam is not consistent with asthma exacerbation.  Differentials also include COPD exacerbation with her smoking history and heart failure exacerbation.  She does not have history of heart failure and denies symptoms of PND orthopnea, weight is stable from prior year.  I think this could be a progression of asthma to moderate persistent range.  We talked about smoking cessation and she is interested in medications to help her stop smoking.. P: I talked  with patient about using Advair daily and as needed for rescue inhaler.  She endorsed understanding, but would also like to keep albuterol on hand as she is already purchased inhaler for this. -Advair (ICS/LABA) once daily and as needed for rescue inhaler -Albuterol inhaler as needed -Chantix -Nicotine patch -Follow-up in 4 weeks  Colon cancer screening She has never had a colonoscopy or completed FOBT.   -FOBT kit and follow-up in 4 weeks   Patient discussed with Dr. Hansel Starling Haislee Corso, D.O. [facility]  PGY-2 Pager: [phone]  Phone: [phone] Date 07/25/2021  Time 11:41 AM

## 2021-07-17 ENCOUNTER — Other Ambulatory Visit: Payer: Self-pay | Admitting: Internal Medicine

## 2021-07-18 ENCOUNTER — Ambulatory Visit: Payer: Medicaid Other | Admitting: Internal Medicine

## 2021-07-18 ENCOUNTER — Other Ambulatory Visit (HOSPITAL_COMMUNITY)
Admission: RE | Admit: 2021-07-18 | Discharge: 2021-07-18 | Disposition: A | Discharge status: Home / Self Care | Payer: Medicaid Other | Source: Ambulatory Visit | Attending: Internal Medicine | Admitting: Internal Medicine

## 2021-07-18 ENCOUNTER — Telehealth: Payer: Self-pay

## 2021-07-18 ENCOUNTER — Encounter: Payer: Self-pay | Admitting: Internal Medicine

## 2021-07-18 ENCOUNTER — Other Ambulatory Visit: Payer: Self-pay | Admitting: Internal Medicine

## 2021-07-18 VITALS — BP 148/66 | HR 89 | Temp 98.2°F | Ht 61.0 in | Wt 238.0 lb

## 2021-07-18 DIAGNOSIS — J4540 Moderate persistent asthma, uncomplicated: Secondary | ICD-10-CM | POA: Diagnosis not present

## 2021-07-18 DIAGNOSIS — I10 Essential (primary) hypertension: Secondary | ICD-10-CM | POA: Diagnosis not present

## 2021-07-18 DIAGNOSIS — F17210 Nicotine dependence, cigarettes, uncomplicated: Secondary | ICD-10-CM

## 2021-07-18 DIAGNOSIS — Z7984 Long term (current) use of oral hypoglycemic drugs: Secondary | ICD-10-CM | POA: Diagnosis not present

## 2021-07-18 DIAGNOSIS — Z720 Tobacco use: Secondary | ICD-10-CM

## 2021-07-18 DIAGNOSIS — Z124 Encounter for screening for malignant neoplasm of cervix: Secondary | ICD-10-CM | POA: Diagnosis present

## 2021-07-18 DIAGNOSIS — E119 Type 2 diabetes mellitus without complications: Secondary | ICD-10-CM | POA: Diagnosis present

## 2021-07-18 DIAGNOSIS — Z6841 Body Mass Index (BMI) 40.0 and over, adult: Secondary | ICD-10-CM | POA: Diagnosis not present

## 2021-07-18 DIAGNOSIS — R87810 Cervical high risk human papillomavirus (HPV) DNA test positive: Secondary | ICD-10-CM

## 2021-07-18 DIAGNOSIS — Z1211 Encounter for screening for malignant neoplasm of colon: Secondary | ICD-10-CM

## 2021-07-18 LAB — GLUCOSE, CAPILLARY: Glucose-Capillary: 123 mg/dL — ABNORMAL HIGH (ref 70–99)

## 2021-07-18 LAB — POCT GLYCOSYLATED HEMOGLOBIN (HGB A1C): Hemoglobin A1C: 6.5 % — AB (ref 4.0–5.6)

## 2021-07-18 MED ORDER — LISINOPRIL-HYDROCHLOROTHIAZIDE 20-12.5 MG PO TABS: 1.0000 | ORAL_TABLET | Freq: Every day | ORAL | 11 refills | Status: DC

## 2021-07-18 MED ORDER — VARENICLINE TARTRATE 0.5 MG PO TABS: ORAL_TABLET | ORAL | 0 refills | Status: AC

## 2021-07-18 MED ORDER — FLUTICASONE-SALMETEROL 500-50 MCG/ACT IN AEPB: INHALATION_SPRAY | RESPIRATORY_TRACT | 2 refills | Status: DC

## 2021-07-18 MED ORDER — NICOTINE 21 MG/24HR TD PT24: 21.0000 mg | MEDICATED_PATCH | TRANSDERMAL | 1 refills | Status: DC

## 2021-07-18 MED ORDER — BENAZEPRIL-HYDROCHLOROTHIAZIDE 20-12.5 MG PO TABS: 1.0000 | ORAL_TABLET | Freq: Every day | ORAL | 11 refills | Status: DC

## 2021-07-18 NOTE — Assessment & Plan Note (Addendum)
Patient presents with several weeks of increased shortness of breath.  PFT in 2017 consistent with reactive airway disease.  She states that her symptoms typically worsen in the summer months.  She has been using Advair inhaler once daily with albuterol about twice daily.  Albuterol inhaler does relieve her symptoms for short period of time.  She is a current smoker, smokes about 1/2 pack for the last 20+ years.  She denies increased coughing, sputum production, PND, and orthopnea. VS: Oxygen saturation of 94% On physical exam she has normal inspiratory effort on room air, no wheezing or rales present in lungs bilaterally, no JVD, no edema in lower extremities bilaterally. A: Her physical exam is not consistent with asthma exacerbation.  Differentials also include COPD exacerbation with her smoking history and heart failure exacerbation.  She does not have history of heart failure and denies symptoms of PND orthopnea, weight is stable from prior year.  I think this could be a progression of asthma to moderate persistent range.  We talked about smoking cessation and she is interested in medications to help her stop smoking.. P: I talked with patient about using Advair daily and as needed for rescue inhaler.  She endorsed understanding, but would also like to keep albuterol on hand as she is already purchased inhaler for this. -Advair (ICS/LABA) once daily and as needed for rescue inhaler -Albuterol inhaler as needed -Chantix -Nicotine patch -Follow-up in 4 weeks

## 2021-07-18 NOTE — Assessment & Plan Note (Addendum)
Patient presents with history of well-controlled type 2 diabetes.  Hemoglobin A1c is stable at 6.5 today.  She reports adherence to metformin 500 mg twice daily.  She has continued to check her blood sugar.  Today we talked about no longer needing to check blood sugar as she is on a low risk medication with metformin. A/P: Continue metformin 500 mg twice daily HgbA1c has been stable for last year, follow-up A1c in 6 months.

## 2021-07-18 NOTE — Telephone Encounter (Signed)
Returned her call to discuss PREP program referral; she wants to attend at [place], will ask Pam RN [facility] to contact her with schedule at [facility].

## 2021-07-18 NOTE — Assessment & Plan Note (Signed)
She has never had a colonoscopy or completed FOBT.   -FOBT kit and follow-up in 4 weeks

## 2021-07-18 NOTE — Telephone Encounter (Signed)
Called to discuss PREP program referral, left voicemail  

## 2021-07-18 NOTE — Telephone Encounter (Signed)
Pharmacy comment: Alternative Requested:NON FORMULARY.

## 2021-07-18 NOTE — Assessment & Plan Note (Addendum)
BP Readings from Last 3 Encounters:  07/18/21 (!) 148/66  03/07/21 126/65  10/23/20 (!) 119/55   Patient currently takes amlodipine 10 mg daily and hydrochlorothiazide 25 mg daily.  She reports adherence with this medication.  Blood pressure is elevated at 151/68 to 148/66 when this was rechecked.  She has a blood pressure cuff at home but has not checked it recently.  She denies headache, vision changes, and chest pain. A/P: Patient presents with stage II hypertension.  Risk factors include obesity with BMI of 44 and sedentary lifestyle. -Start benazepril-hydrochlorothiazide 20-12.5 mg daily -Continue amlodipine 10 mg daily -Referral placed to exercise program -Follow-up in 4 weeks for blood pressure recheck and BMP.  Would consider titrating combination pill if blood pressure remains uncontrolled at that time.  Addendum 7/5: Received message from pharmacy that Merla Riches is not on formulary for insurance. -Updated medication list and sent in lisinopril-hydrochlorothiazide 20-12.5 mg

## 2021-07-18 NOTE — Patient Instructions (Signed)
Thank you, [name] for allowing Crystal Briggs to provide your care today. Today we discussed:  Blood pressure: This was high today. Goal is less than 130/80. Please check BP in the morning for the next few weeks and bring log in. I have sent in a combination pill with benazepril and Hydrochlorothiazide. Continue taking amlodipine as well.  Obesity: Decreasing weight will also help with blood pressure. I have referred you to an exercise program through the [facility]. They will call in a few days.  Asthma: Use Advair inhaler once daily and as need for rescue inhaler.  Tobacco use: I think that stopping smoking will help with your Asthma. Please try chantix pills and nicotine patches. Follow-up in 4 weeks.  Colon cancer screening: Paper test for blood in stool. If positive you will need colonoscopy.  Cervical cancer screening: I will call with results of pap smear      I have ordered the following labs for you:   Lab Orders         Fecal occult blood, imunochemical         Glucose, capillary         POC Hbg A1C       Referrals ordered today:    Referral Orders         Amb Referral To Provider Referral Exercise Program (P.R.E.P)      I have ordered the following medication/changed the following medications:   Stop the following medications: Medications Discontinued During This Encounter  Medication Reason   aspirin (ASPIRIN LOW DOSE) 81 MG EC tablet Discontinued by provider   hydrochlorothiazide (HYDRODIURIL) 25 MG tablet Change in therapy   ADVAIR DISKUS 500-50 MCG/ACT AEPB Reorder     Start the following medications: Meds ordered this encounter  Medications   varenicline (CHANTIX) 0.5 MG tablet    Sig: Take 1 tablet (0.5 mg total) by mouth daily for 3 days, THEN 1 tablet (0.5 mg total) 2 (two) times daily for 3 days, THEN 2 tablets (1 mg total) 2 (two) times daily.    Dispense:  249 tablet    Refill:  0   nicotine (NICODERM CQ - DOSED IN MG/24 HOURS) 21 mg/24hr patch     Sig: Place 1 patch (21 mg total) onto the skin daily.    Dispense:  30 patch    Refill:  1   fluticasone-salmeterol (ADVAIR DISKUS) 500-50 MCG/ACT AEPB    Sig: Use daily and as needed for shortness of breath symptoms.    Dispense:  180 each    Refill:  2   benazepril-hydrochlorthiazide (LOTENSIN HCT) 20-12.5 MG tablet    Sig: Take 1 tablet by mouth daily.    Dispense:  30 tablet    Refill:  11     Follow up:  4 weeks    Remember: please check blood pressure 1 time daily and bring log in   We look forward to seeing you next time. Please call our clinic at [phone] if you have any questions or concerns. The best time to call is Monday-Friday from 9am-4pm, but there is someone available 24/7. If after hours or the weekend, call the main hospital number and ask for the Internal Medicine Resident On-Call. If you need medication refills, please notify your pharmacy one week in advance and they will send Crystal Briggs a request.   Thank you for trusting me with your care. Wishing you the best!   Rudene Christians, DO [facility]

## 2021-07-18 NOTE — Assessment & Plan Note (Signed)
Patient with BMI of 44.  Weight loss would also help with blood pressure and diabetes control.  -Provider referral exercise program

## 2021-07-18 NOTE — Assessment & Plan Note (Addendum)
Patient with history of high risk HPV on Pap smear in February 2020.  Colposcopy was done in August of that year with negative cytology.  She is here today to repeat Pap smear.  She is not currently sexually active and declines STI testing at this time. A/P: -Pap smear with HPV testing  Addendum 7/12: Pap was negative for HPV and negative cytology. She will need follow-up PAP in 3 years per ASCCP guidelines.  Female nursing staff present during patient's pelvic examination

## 2021-07-20 ENCOUNTER — Telehealth: Payer: Self-pay

## 2021-07-20 LAB — CYTOLOGY - PAP
Comment: NEGATIVE
Diagnosis: NEGATIVE
High risk HPV: NEGATIVE

## 2021-07-20 NOTE — Telephone Encounter (Signed)
LVMT requesting call back to discuss next PREP class starting on 08/13/21

## 2021-07-24 NOTE — Progress Notes (Signed)
Internal Medicine Clinic Attending  Case discussed with Dr. Masters  At the time of the visit.  We reviewed the resident's history and exam and pertinent patient test results.  I agree with the assessment, diagnosis, and plan of care documented in the resident's note.  

## 2021-09-09 ENCOUNTER — Other Ambulatory Visit: Payer: Self-pay | Admitting: Internal Medicine

## 2021-09-09 DIAGNOSIS — N393 Stress incontinence (female) (male): Secondary | ICD-10-CM

## 2021-09-09 DIAGNOSIS — J4530 Mild persistent asthma, uncomplicated: Secondary | ICD-10-CM

## 2021-09-09 DIAGNOSIS — R682 Dry mouth, unspecified: Secondary | ICD-10-CM

## 2021-09-12 ENCOUNTER — Other Ambulatory Visit: Payer: Self-pay | Admitting: Student

## 2021-10-19 ENCOUNTER — Other Ambulatory Visit: Payer: Self-pay | Admitting: Internal Medicine

## 2021-10-19 DIAGNOSIS — J4540 Moderate persistent asthma, uncomplicated: Secondary | ICD-10-CM

## 2021-10-19 DIAGNOSIS — E1165 Type 2 diabetes mellitus with hyperglycemia: Secondary | ICD-10-CM

## 2021-10-26 ENCOUNTER — Other Ambulatory Visit: Payer: Self-pay | Admitting: Internal Medicine

## 2021-10-26 DIAGNOSIS — Z720 Tobacco use: Secondary | ICD-10-CM

## 2021-12-10 ENCOUNTER — Encounter: Payer: Medicaid Other | Admitting: Student

## 2021-12-10 ENCOUNTER — Encounter: Payer: Self-pay | Admitting: Student

## 2021-12-13 ENCOUNTER — Other Ambulatory Visit: Payer: Self-pay | Admitting: Internal Medicine

## 2021-12-13 DIAGNOSIS — J4540 Moderate persistent asthma, uncomplicated: Secondary | ICD-10-CM

## 2021-12-13 DIAGNOSIS — K2100 Gastro-esophageal reflux disease with esophagitis, without bleeding: Secondary | ICD-10-CM

## 2021-12-13 DIAGNOSIS — E1165 Type 2 diabetes mellitus with hyperglycemia: Secondary | ICD-10-CM

## 2021-12-17 ENCOUNTER — Other Ambulatory Visit: Payer: Self-pay | Admitting: Student

## 2021-12-17 DIAGNOSIS — J4540 Moderate persistent asthma, uncomplicated: Secondary | ICD-10-CM

## 2021-12-17 DIAGNOSIS — K2100 Gastro-esophageal reflux disease with esophagitis, without bleeding: Secondary | ICD-10-CM

## 2021-12-17 DIAGNOSIS — E1165 Type 2 diabetes mellitus with hyperglycemia: Secondary | ICD-10-CM

## 2021-12-17 MED ORDER — CETIRIZINE HCL 10 MG PO TABS: 10.0000 mg | ORAL_TABLET | Freq: Every day | ORAL | 1 refills | Status: AC

## 2021-12-17 MED ORDER — FLUTICASONE-SALMETEROL 500-50 MCG/ACT IN AEPB: INHALATION_SPRAY | RESPIRATORY_TRACT | 2 refills | Status: AC

## 2021-12-17 MED ORDER — ACCU-CHEK GUIDE VI STRP: ORAL_STRIP | 25 refills | Status: DC

## 2021-12-17 MED ORDER — PANTOPRAZOLE SODIUM 40 MG PO TBEC: DELAYED_RELEASE_TABLET | ORAL | 3 refills | Status: AC

## 2021-12-17 NOTE — Telephone Encounter (Signed)
Return pt's call - informed pt refills were requested 11/30 and we give the doctors 48 hrs to refill meds (not including the w/e). And to keep her appt on the 6th; stated she will.  Also stated she had a cough for 2 months and no one will give her any medication for it.  Next appt scheduled 12/19/21 with PCP.  Also requesting a refill on ASA - not on current med list; per chart discontinued 07/18/21.

## 2021-12-17 NOTE — Telephone Encounter (Signed)
Pt requesting a call back. Pt states she called in on 12/13/2021 and wants a nurse to call her back about why her medications have not been called in yet.   Aspirin 81 mg Oral 5 times daily, SWALLOW WHOLE.  Glucose Blood USE TO CHECK BLOOD SUGAR TWICE A DAY  Pantoprazole Sodium TAKE 1 TABLET BY MOUTH EVERY DAY BEFORE BREAKFAST  Cetirizine HCl 10 MG TAKE 1 TABLET BY MOUTH EVERY DAY  Fluticasone-Salmeterol 500-50 MC  [id number] [place], [place] - [address] AT [facility] OF [address] [place], [place] [address] Phone: [phone]  Fax: [phone]

## 2021-12-19 ENCOUNTER — Telehealth: Payer: Self-pay | Admitting: *Deleted

## 2021-12-19 ENCOUNTER — Other Ambulatory Visit: Payer: Self-pay

## 2021-12-19 ENCOUNTER — Encounter: Payer: Self-pay | Admitting: Internal Medicine

## 2021-12-19 ENCOUNTER — Ambulatory Visit (INDEPENDENT_AMBULATORY_CARE_PROVIDER_SITE_OTHER): Payer: Medicaid Other | Admitting: Internal Medicine

## 2021-12-19 VITALS — BP 135/63 | HR 88 | Temp 98.8°F | Ht 61.0 in | Wt 276.2 lb

## 2021-12-19 DIAGNOSIS — Z7984 Long term (current) use of oral hypoglycemic drugs: Secondary | ICD-10-CM

## 2021-12-19 DIAGNOSIS — E119 Type 2 diabetes mellitus without complications: Secondary | ICD-10-CM

## 2021-12-19 DIAGNOSIS — R053 Chronic cough: Secondary | ICD-10-CM | POA: Diagnosis not present

## 2021-12-19 DIAGNOSIS — F17210 Nicotine dependence, cigarettes, uncomplicated: Secondary | ICD-10-CM | POA: Diagnosis not present

## 2021-12-19 DIAGNOSIS — J4540 Moderate persistent asthma, uncomplicated: Secondary | ICD-10-CM | POA: Diagnosis present

## 2021-12-19 DIAGNOSIS — I10 Essential (primary) hypertension: Secondary | ICD-10-CM

## 2021-12-19 LAB — GLUCOSE, CAPILLARY: Glucose-Capillary: 80 mg/dL (ref 70–99)

## 2021-12-19 MED ORDER — HYDROCHLOROTHIAZIDE 25 MG PO TABS: 25.0000 mg | ORAL_TABLET | Freq: Every day | ORAL | 11 refills | Status: DC

## 2021-12-19 NOTE — Progress Notes (Unsigned)
Subjective:  CC: productive cough  HPI:  CrystalBellatrix Crystal Briggs is a 56 y.o. female with a past medical history stated below and presents today for productive cough since July. Please see problem based assessment and plan for additional details.  Past Medical History:  Diagnosis Date   Asthma, chronic 03/30/2012   Essential hypertension, benign 03/30/2012   Hypertension    Seasonal allergies 03/30/2012   Stress incontinence, female 03/30/2012   Tobacco abuse 03/30/2012    Current Outpatient Medications on File Prior to Visit  Medication Sig Dispense Refill   Accu-Chek FastClix Lancets MISC CHECK BLOOD SUGAR 1 TIMES A DAY 102 each 6   amLODipine (NORVASC) 10 MG tablet Take 1 tablet (10 mg total) by mouth daily. 90 tablet 3   ASPIRIN LOW DOSE 81 MG tablet TAKE 1 TABLET (81 MG TOTAL) BY MOUTH DAILY. SWALLOW WHOLE. 90 tablet 3   atorvastatin (LIPITOR) 40 MG tablet Take 1 tablet (40 mg total) by mouth daily. 90 tablet 3   cetirizine (ZYRTEC) 10 MG tablet Take 1 tablet (10 mg total) by mouth daily. 90 tablet 1   diclofenac Sodium (VOLTAREN) 1 % GEL Apply 2 g topically 4 (four) times daily. 100 g 1   famotidine (PEPCID) 20 MG tablet TAKE 1 TABLET BY MOUTH EVERYDAY AT BEDTIME 90 tablet 1   fluticasone (FLONASE) 50 MCG/ACT nasal spray PLACE 1 SPRAY INTO BOTH NOSTRILS DAILY AS NEEDED FOR ALLERGIES OR RHINITIS. 16 mL 5   fluticasone-salmeterol (ADVAIR DISKUS) 500-50 MCG/ACT AEPB Use daily and as needed for shortness of breath symptoms. 180 each 2   glucose blood (ACCU-CHEK GUIDE) test strip CHECK BLOOD SUGAR TWICE A DAY 50 strip 25   metFORMIN (GLUCOPHAGE) 500 MG tablet TAKE 1 TABLET BY MOUTH 2 TIMES DAILY WITH A MEAL. 180 tablet 1   mometasone (ELOCON) 0.1 % cream Apply 1 application topically as directed. Bid to aa Right thigh for 2 weeks, then decrease to qd 5 days a week 50 g 3   montelukast (SINGULAIR) 10 MG tablet Take 1 tablet (10 mg total) by mouth at bedtime. 90 tablet 3   nicotine  (NICODERM CQ - DOSED IN MG/24 HOURS) 21 mg/24hr patch PLACE 1 PATCH ONTO THE SKIN DAILY. 28 patch 1   oxybutynin (DITROPAN-XL) 5 MG 24 hr tablet TAKE 1 TABLET BY MOUTH TWICE A DAY 180 tablet 1   pantoprazole (PROTONIX) 40 MG tablet Take one tablet daily 90 tablet 3   pimecrolimus (ELIDEL) 1 % cream Apply topically 2 (two) times daily. 100 g 6   polyethylene glycol (MIRALAX / GLYCOLAX) packet TAKE 17 GRAMS MIXED IN LIQUID EVERY DAY 28 packet 0   No current facility-administered medications on file prior to visit.    No family history on file.  Social History   Socioeconomic History   Marital status: Single    Spouse name: Not on file   Number of children: Not on file   Years of education: Not on file   Highest education level: Not on file  Occupational History   Not on file  Tobacco Use   Smoking status: Every Day    Packs/day: 0.75    Years: 20.00    Total pack years: 15.00    Types: Cigarettes   Smokeless tobacco: Never   Tobacco comments:    12 cigs per day  Substance and Sexual Activity   Alcohol use: No    Alcohol/week: 0.0 standard drinks of alcohol   Drug use: No  Sexual activity: Yes  Other Topics Concern   Not on file  Social History Narrative   Not on file   Social Determinants of Health   Financial Resource Strain: Not on file  Food Insecurity: Food Insecurity Present (12/19/2021)   Hunger Vital Sign    Worried About Running Out of Food in the Last Year: Sometimes true    Ran Out of Food in the Last Year: Sometimes true  Transportation Needs: No Transportation Needs (12/19/2021)   PRAPARE - Administrator, Civil Service (Medical): No    Lack of Transportation (Non-Medical): No  Physical Activity: Not on file  Stress: Not on file  Social Connections: Moderately Integrated (12/19/2021)   Social Connection and Isolation Panel [NHANES]    Frequency of Communication with Friends and Family: More than three times a week    Frequency of Social  Gatherings with Friends and Family: More than three times a week    Attends Religious Services: More than 4 times per year    Active Member of [employer] or Organizations: No    Attends Banker Meetings: Never    Marital Status: Living with partner  Intimate Partner Violence: Not At Risk (12/19/2021)   Humiliation, Afraid, Rape, and Kick questionnaire    Fear of Current or Ex-Partner: No    Emotionally Abused: No    Physically Abused: No    Sexually Abused: No    Review of Systems: ROS negative except for what is noted on the assessment and plan.  Objective:   Vitals:   12/19/21 1427 12/19/21 1434  BP: (!) 143/83 135/63  Pulse: (!) 103 88  Temp: 98.8 F (37.1 C)   TempSrc: Oral   SpO2: 97%   Weight: 276 lb 3.2 oz (125.3 kg)   Height: 5\' 1"  (1.549 m)     Physical Exam: Constitutional: well-appearing Cardiovascular: regular rate and rhythm, no m/r/g Pulmonary/Chest: normal work of breathing on room air, mild expiratory wheezing present diffusely in upper and lower lung spaces bilaterally Abdominal: soft, non-tender, non-distended MSK: normal bulk and tone Skin: warm and dry  Assessment & Plan:  Chronic cough Ms. Manolis presents with several months history of productive cough.  She was seen in clinic in July and it had increased shortness of breath.  She uses Advair at least twice daily with mild improvement in symptoms.  Her asthma is typically worse in the summer months.  Since July the shortness of breath has been persistent and she has felt like she has had a cold with a productive cough.  She has tried Mucinex without improvement in symptoms.  She has a history of smoking and is using nicotine patches to decrease cravings.  She has decreased amount from 1/2 pack daily to 6 cigarettes daily.  She is not interested in Chantix at this time. Assessment: PFT was completed in 2017 and was consistent with reactive airway disease.  She has continued to smoke for the last  6 years.  Differentials for chronic cough include worsening of asthma versus additional lung structure damage with COPD in setting of her smoking history.  She was also started on an ACE inhibitor in July and this could be contributing to cough although would not expect this to be a productive cough. She also has a history of GERD but takes pantoprazole and pepcid with improvement in symptoms. Plan: Repeat PFT Continue Advair 500-50 QD and PRN for relief of symptoms Discontinue combo pill with lisinopril Start HCTZ 25 mg -  follow-up in 4 weeks  Type 2 diabetes mellitus (HCC) Repeat A1c at follow-up.  Essential hypertension, benign BP Readings from Last 3 Encounters:  12/19/21 135/63  07/18/21 (!) 148/66  03/07/21 126/65    Current medications include amlodipine 10 mg and Lisinopril 20- hctz 12.5 mg that was started in July. She has a cough that started in July.  P: Discontinue combo pill with lisinopril Continue amlodipine 10 mg Start HCTZ 25 mg Recheck BMP at follow-up in 4 weeks  Patient discussed with Dr. Marjorie Smolder Eeshan Verbrugge, D.O. [facility]  PGY-2 Pager: [phone]  Phone: [phone] Date 12/20/2021  Time 3:59 PM

## 2021-12-19 NOTE — Patient Instructions (Addendum)
Thank you, Ms.Chancie M Ballman for allowing [place] to provide your care today.   Chronic cough I would like to repeat pulmonary function test. You have some wheezing on exam today and I am wondering if your could have both asthma and COPD from smoking. You were started on a blood pressure medication called lisinopril in July. Sometime this medication can increase coughing. I would like to discontinue this medication.  Blood pressure I have discontinued Zestoretic and sent in HCTZ 25 mg.  Smoking Continue decreasing the amount you are smoking. If you become interested we can also think about trying chantix which decreases cravings for smoking.  I have ordered the following labs for you:   Lab Orders         Glucose, capillary       I have ordered the following medication/changed the following medications:   Stop the following medications: Medications Discontinued During This Encounter  Medication Reason   montelukast (SINGULAIR) 10 MG tablet    VENTOLIN HFA 108 (90 Base) MCG/ACT inhaler Change in therapy     Start the following medications: No orders of the defined types were placed in this encounter.   Follow-up in 4 weeks.  We look forward to seeing you next time. Please call our clinic at [phone] if you have any questions or concerns. The best time to call is Monday-Friday from 9am-4pm, but there is someone available 24/7. If after hours or the weekend, call the main hospital number and ask for the Internal Medicine Resident On-Call. If you need medication refills, please notify your pharmacy one week in advance and they will send [place] a request.   Thank you for trusting me with your care. Wishing you the best!   Rudene Christians, DO [facility]

## 2021-12-19 NOTE — Telephone Encounter (Signed)
Call to respiratory therapy.  Message left that patient needs to be scheduled for PFT's.

## 2021-12-20 DIAGNOSIS — R053 Chronic cough: Secondary | ICD-10-CM | POA: Insufficient documentation

## 2021-12-20 NOTE — Assessment & Plan Note (Addendum)
BP Readings from Last 3 Encounters:  12/19/21 135/63  07/18/21 (!) 148/66  03/07/21 126/65    Current medications include amlodipine 10 mg and Lisinopril 20- hctz 12.5 mg that was started in July. She has a cough that started in July.  P: Discontinue combo pill with lisinopril Continue amlodipine 10 mg Start HCTZ 25 mg Recheck BMP at follow-up in 4 weeks

## 2021-12-20 NOTE — Assessment & Plan Note (Signed)
Repeat A1c at follow-up.

## 2021-12-20 NOTE — Assessment & Plan Note (Addendum)
Crystal Briggs presents with several months history of productive cough.  She was seen in clinic in July and it had increased shortness of breath.  She uses Advair at least twice daily with mild improvement in symptoms.  Her asthma is typically worse in the summer months.  Since July the shortness of breath has been persistent and she has felt like she has had a cold with a productive cough.  She has tried Mucinex without improvement in symptoms.  She has a history of smoking and is using nicotine patches to decrease cravings.  She has decreased amount from 1/2 pack daily to 6 cigarettes daily.  She is not interested in Chantix at this time. Assessment: PFT was completed in 2017 and was consistent with reactive airway disease.  She has continued to smoke for the last 6 years.  Differentials for chronic cough include worsening of asthma versus additional lung structure damage with COPD in setting of her smoking history.  She was also started on an ACE inhibitor in July and this could be contributing to cough although would not expect this to be a productive cough. She also has a history of GERD but takes pantoprazole and pepcid with improvement in symptoms. Plan: Repeat PFT Continue Advair 500-50 QD and PRN for relief of symptoms Discontinue combo pill with lisinopril Start HCTZ 25 mg -follow-up in 4 weeks

## 2021-12-24 LAB — HM DIABETES EYE EXAM

## 2021-12-25 NOTE — Progress Notes (Signed)
Internal Medicine Clinic Attending  Case discussed with Dr. Masters  At the time of the visit.  We reviewed the resident's history and exam and pertinent patient test results.  I agree with the assessment, diagnosis, and plan of care documented in the resident's note.  

## 2021-12-25 NOTE — Progress Notes (Shared)
[facility] - Clinic Note  01/01/2022     CHIEF COMPLAINT Patient presents for No chief complaint on file.   HISTORY OF PRESENT ILLNESS: Crystal Briggs is a 56 y.o. female who presents to the clinic today for:     Referring physician: Morene Crocker, MD [address] [place],  [place] [address]  HISTORICAL INFORMATION:   Selected notes from the MEDICAL RECORD NUMBER Referred by Dr. Nedra Hai:  Ocular Hx- PMH-    CURRENT MEDICATIONS: No current outpatient medications on file. (Ophthalmic Drugs)   No current facility-administered medications for this visit. (Ophthalmic Drugs)   Current Outpatient Medications (Other)  Medication Sig   Accu-Chek FastClix Lancets MISC CHECK BLOOD SUGAR 1 TIMES A DAY   amLODipine (NORVASC) 10 MG tablet Take 1 tablet (10 mg total) by mouth daily.   ASPIRIN LOW DOSE 81 MG tablet TAKE 1 TABLET (81 MG TOTAL) BY MOUTH DAILY. SWALLOW WHOLE.   atorvastatin (LIPITOR) 40 MG tablet Take 1 tablet (40 mg total) by mouth daily.   cetirizine (ZYRTEC) 10 MG tablet Take 1 tablet (10 mg total) by mouth daily.   diclofenac Sodium (VOLTAREN) 1 % GEL Apply 2 g topically 4 (four) times daily.   famotidine (PEPCID) 20 MG tablet TAKE 1 TABLET BY MOUTH EVERYDAY AT BEDTIME   fluticasone (FLONASE) 50 MCG/ACT nasal spray PLACE 1 SPRAY INTO BOTH NOSTRILS DAILY AS NEEDED FOR ALLERGIES OR RHINITIS.   fluticasone-salmeterol (ADVAIR DISKUS) 500-50 MCG/ACT AEPB Use daily and as needed for shortness of breath symptoms.   glucose blood (ACCU-CHEK GUIDE) test strip CHECK BLOOD SUGAR TWICE A DAY   hydrochlorothiazide (HYDRODIURIL) 25 MG tablet Take 1 tablet (25 mg total) by mouth daily.   metFORMIN (GLUCOPHAGE) 500 MG tablet TAKE 1 TABLET BY MOUTH 2 TIMES DAILY WITH A MEAL.   mometasone (ELOCON) 0.1 % cream Apply 1 application topically as directed. Bid to aa Right thigh for 2 weeks, then decrease to qd 5 days a week   montelukast (SINGULAIR) 10 MG tablet  Take 1 tablet (10 mg total) by mouth at bedtime.   nicotine (NICODERM CQ - DOSED IN MG/24 HOURS) 21 mg/24hr patch PLACE 1 PATCH ONTO THE SKIN DAILY.   oxybutynin (DITROPAN-XL) 5 MG 24 hr tablet TAKE 1 TABLET BY MOUTH TWICE A DAY   pantoprazole (PROTONIX) 40 MG tablet Take one tablet daily   pimecrolimus (ELIDEL) 1 % cream Apply topically 2 (two) times daily.   polyethylene glycol (MIRALAX / GLYCOLAX) packet TAKE 17 GRAMS MIXED IN LIQUID EVERY DAY   No current facility-administered medications for this visit. (Other)      REVIEW OF SYSTEMS:    ALLERGIES Allergies  Allergen Reactions   Tomato Rash    PAST MEDICAL HISTORY Past Medical History:  Diagnosis Date   Asthma, chronic 03/30/2012   Essential hypertension, benign 03/30/2012   Hypertension    Seasonal allergies 03/30/2012   Stress incontinence, female 03/30/2012   Tobacco abuse 03/30/2012   No past surgical history on file.  FAMILY HISTORY No family history on file.  SOCIAL HISTORY Social History   Tobacco Use   Smoking status: Every Day    Packs/day: 0.75    Years: 20.00    Total pack years: 15.00    Types: Cigarettes   Smokeless tobacco: Never   Tobacco comments:    12 cigs per day  Substance Use Topics   Alcohol use: No    Alcohol/week: 0.0 standard drinks of alcohol  Drug use: No         OPHTHALMIC EXAM:  Not recorded     IMAGING AND PROCEDURES  Imaging and Procedures for 01/01/2022           ASSESSMENT/PLAN:  No diagnosis found.  1.  2.  3.  Ophthalmic Meds Ordered this visit:  No orders of the defined types were placed in this encounter.      No follow-ups on file.  There are no Patient Instructions on file for this visit.   Explained the diagnoses, plan, and follow up with the patient and they expressed understanding.  Patient expressed understanding of the importance of proper follow up care.   This document serves as a record of services personally performed by  Gardiner Sleeper, MD, PhD. It was created on their behalf by Renaldo Reel, [place] an ophthalmic technician. The creation of this record is the provider's dictation and/or activities during the visit.    Electronically signed by:  Renaldo Reel, COT  12.12.23 10:48 AM   Gardiner Sleeper, M.D., Ph.D. Diseases & Surgery of the Retina and [place] Diabetic [place] @TODAY @     Abbreviations: M myopia (nearsighted); A astigmatism; H hyperopia (farsighted); P presbyopia; Mrx spectacle prescription;  CTL contact lenses; OD right eye; OS left eye; OU both eyes  XT exotropia; ET esotropia; PEK punctate epithelial keratitis; PEE punctate epithelial erosions; DES dry eye syndrome; MGD meibomian gland dysfunction; ATs artificial tears; PFAT's preservative free artificial tears; [place] nuclear sclerotic cataract; PSC posterior subcapsular cataract; ERM epi-retinal membrane; PVD posterior vitreous detachment; RD retinal detachment; DM diabetes mellitus; DR diabetic retinopathy; NPDR non-proliferative diabetic retinopathy; PDR proliferative diabetic retinopathy; CSME clinically significant macular edema; DME diabetic macular edema; dbh dot blot hemorrhages; CWS cotton wool spot; POAG primary open angle glaucoma; C/D cup-to-disc ratio; HVF humphrey visual field; GVF goldmann visual field; OCT optical coherence tomography; IOP intraocular pressure; BRVO Branch retinal vein occlusion; CRVO central retinal vein occlusion; CRAO central retinal artery occlusion; BRAO branch retinal artery occlusion; RT retinal tear; SB scleral buckle; PPV pars plana vitrectomy; VH Vitreous hemorrhage; PRP panretinal laser photocoagulation; IVK intravitreal kenalog; VMT vitreomacular traction; MH Macular hole;  NVD neovascularization of the disc; NVE neovascularization elsewhere; AREDS age related eye disease study; ARMD age related macular degeneration; POAG primary open angle glaucoma; EBMD epithelial/anterior basement  membrane dystrophy; ACIOL anterior chamber intraocular lens; IOL intraocular lens; PCIOL posterior chamber intraocular lens; Phaco/IOL phacoemulsification with intraocular lens placement; [place] photorefractive keratectomy; LASIK laser assisted in situ keratomileusis; HTN hypertension; DM diabetes mellitus; COPD chronic obstructive pulmonary disease

## 2021-12-26 ENCOUNTER — Encounter (HOSPITAL_COMMUNITY): Payer: Medicaid Other

## 2021-12-27 ENCOUNTER — Ambulatory Visit (HOSPITAL_COMMUNITY)
Admission: RE | Admit: 2021-12-27 | Discharge: 2021-12-27 | Disposition: A | Discharge status: Home / Self Care | Payer: Medicaid Other | Source: Ambulatory Visit | Attending: Internal Medicine | Admitting: Internal Medicine

## 2021-12-27 ENCOUNTER — Other Ambulatory Visit: Payer: Medicaid Other

## 2021-12-27 DIAGNOSIS — J4540 Moderate persistent asthma, uncomplicated: Secondary | ICD-10-CM | POA: Insufficient documentation

## 2021-12-27 DIAGNOSIS — Z1211 Encounter for screening for malignant neoplasm of colon: Secondary | ICD-10-CM

## 2021-12-27 LAB — PULMONARY FUNCTION TEST
DL/VA % pred: 67 %
DL/VA: 2.97 ml/min/mmHg/L
DLCO unc % pred: 39 %
DLCO unc: 7.11 ml/min/mmHg
FEF 25-75 Post: 0.69 L/sec
FEF 25-75 Pre: 0.69 L/sec
FEF2575-%Change-Post: 0 %
FEF2575-%Pred-Post: 30 %
FEF2575-%Pred-Pre: 30 %
FEV1-%Change-Post: -1 %
FEV1-%Pred-Post: 41 %
FEV1-%Pred-Pre: 41 %
FEV1-Post: 0.93 L
FEV1-Pre: 0.94 L
FEV1FVC-%Change-Post: 0 %
FEV1FVC-%Pred-Pre: 94 %
FEV6-%Change-Post: -1 %
FEV6-%Pred-Post: 44 %
FEV6-%Pred-Pre: 45 %
FEV6-Post: 1.25 L
FEV6-Pre: 1.27 L
FEV6FVC-%Pred-Post: 103 %
FEV6FVC-%Pred-Pre: 103 %
FVC-%Change-Post: -1 %
FVC-%Pred-Post: 43 %
FVC-%Pred-Pre: 43 %
FVC-Post: 1.25 L
FVC-Pre: 1.27 L
Post FEV1/FVC ratio: 74 %
Post FEV6/FVC ratio: 100 %
Pre FEV1/FVC ratio: 74 %
Pre FEV6/FVC Ratio: 100 %
RV % pred: 56 %
RV: 0.98 L
TLC % pred: 66 %
TLC: 2.97 L

## 2021-12-27 MED ORDER — ALBUTEROL SULFATE (2.5 MG/3ML) 0.083% IN NEBU
2.5000 mg | INHALATION_SOLUTION | Freq: Once | RESPIRATORY_TRACT | Status: AC
  Administered 2021-12-27: 2.5 mg via RESPIRATORY_TRACT

## 2021-12-29 LAB — FECAL OCCULT BLOOD, IMMUNOCHEMICAL: Fecal Occult Bld: NEGATIVE

## 2022-01-01 ENCOUNTER — Encounter (INDEPENDENT_AMBULATORY_CARE_PROVIDER_SITE_OTHER): Payer: Medicaid Other | Admitting: Ophthalmology

## 2022-01-01 ENCOUNTER — Encounter (INDEPENDENT_AMBULATORY_CARE_PROVIDER_SITE_OTHER): Payer: Self-pay

## 2022-01-01 DIAGNOSIS — H3581 Retinal edema: Secondary | ICD-10-CM

## 2022-01-02 ENCOUNTER — Ambulatory Visit (INDEPENDENT_AMBULATORY_CARE_PROVIDER_SITE_OTHER): Payer: Medicaid Other | Admitting: Ophthalmology

## 2022-01-02 ENCOUNTER — Other Ambulatory Visit (INDEPENDENT_AMBULATORY_CARE_PROVIDER_SITE_OTHER): Payer: Medicaid Other | Admitting: Internal Medicine

## 2022-01-02 DIAGNOSIS — H35033 Hypertensive retinopathy, bilateral: Secondary | ICD-10-CM | POA: Diagnosis not present

## 2022-01-02 DIAGNOSIS — R053 Chronic cough: Secondary | ICD-10-CM

## 2022-01-02 DIAGNOSIS — H3563 Retinal hemorrhage, bilateral: Secondary | ICD-10-CM

## 2022-01-02 DIAGNOSIS — E113393 Type 2 diabetes mellitus with moderate nonproliferative diabetic retinopathy without macular edema, bilateral: Secondary | ICD-10-CM | POA: Diagnosis not present

## 2022-01-02 DIAGNOSIS — I10 Essential (primary) hypertension: Secondary | ICD-10-CM | POA: Diagnosis not present

## 2022-01-02 DIAGNOSIS — H25813 Combined forms of age-related cataract, bilateral: Secondary | ICD-10-CM

## 2022-01-02 DIAGNOSIS — H3581 Retinal edema: Secondary | ICD-10-CM

## 2022-01-02 LAB — HM DIABETES EYE EXAM

## 2022-01-02 NOTE — Progress Notes (Signed)
[facility] - Clinic Note  01/02/2022     CHIEF COMPLAINT Patient presents for Retina Evaluation   HISTORY OF PRESENT ILLNESS: Crystal Briggs is a 56 y.o. female who presents to the clinic today for:   HPI     Retina Evaluation   In both eyes.  This started 5 days ago.  Duration of 5 days.  I, the attending physician,  performed the HPI with the patient and updated documentation appropriately.        Comments   Retina eval per Dr Dione Booze for diabetes pt is reporting no vision changes noticed she has noticed some flashes in the left eye but no floaters pt last blood sugar check was a month ago she don't remember the last reading last A1C unknown       Last edited by Rennis Chris, MD on 01/03/2022  2:14 PM.     Patient reports no vision changes. Last A1c was 6.5 on 07.05.23. Highest A1c was around 12 in 2020.  Referring physician: Morene Crocker, MD [address] [place],  [place] [address]  HISTORICAL INFORMATION:   Selected notes from the MEDICAL RECORD NUMBER Referred by Dr. Laruth Bouchard for intraretinal hemorrhages OU LEE:  Ocular Hx- PMH-    CURRENT MEDICATIONS: No current outpatient medications on file. (Ophthalmic Drugs)   No current facility-administered medications for this visit. (Ophthalmic Drugs)   Current Outpatient Medications (Other)  Medication Sig   Accu-Chek FastClix Lancets MISC CHECK BLOOD SUGAR 1 TIMES A DAY   amLODipine (NORVASC) 10 MG tablet Take 1 tablet (10 mg total) by mouth daily.   ASPIRIN LOW DOSE 81 MG tablet TAKE 1 TABLET (81 MG TOTAL) BY MOUTH DAILY. SWALLOW WHOLE.   atorvastatin (LIPITOR) 40 MG tablet Take 1 tablet (40 mg total) by mouth daily.   cetirizine (ZYRTEC) 10 MG tablet Take 1 tablet (10 mg total) by mouth daily.   diclofenac Sodium (VOLTAREN) 1 % GEL Apply 2 g topically 4 (four) times daily.   famotidine (PEPCID) 20 MG tablet TAKE 1 TABLET BY MOUTH EVERYDAY AT BEDTIME   fluticasone (FLONASE) 50  MCG/ACT nasal spray PLACE 1 SPRAY INTO BOTH NOSTRILS DAILY AS NEEDED FOR ALLERGIES OR RHINITIS.   fluticasone-salmeterol (ADVAIR DISKUS) 500-50 MCG/ACT AEPB Use daily and as needed for shortness of breath symptoms.   glucose blood (ACCU-CHEK GUIDE) test strip CHECK BLOOD SUGAR TWICE A DAY   hydrochlorothiazide (HYDRODIURIL) 25 MG tablet Take 1 tablet (25 mg total) by mouth daily.   metFORMIN (GLUCOPHAGE) 500 MG tablet TAKE 1 TABLET BY MOUTH 2 TIMES DAILY WITH A MEAL.   mometasone (ELOCON) 0.1 % cream Apply 1 application topically as directed. Bid to aa Right thigh for 2 weeks, then decrease to qd 5 days a week   montelukast (SINGULAIR) 10 MG tablet Take 1 tablet (10 mg total) by mouth at bedtime.   nicotine (NICODERM CQ - DOSED IN MG/24 HOURS) 21 mg/24hr patch PLACE 1 PATCH ONTO THE SKIN DAILY.   oxybutynin (DITROPAN-XL) 5 MG 24 hr tablet TAKE 1 TABLET BY MOUTH TWICE A DAY   pantoprazole (PROTONIX) 40 MG tablet Take one tablet daily   pimecrolimus (ELIDEL) 1 % cream Apply topically 2 (two) times daily.   polyethylene glycol (MIRALAX / GLYCOLAX) packet TAKE 17 GRAMS MIXED IN LIQUID EVERY DAY   No current facility-administered medications for this visit. (Other)   REVIEW OF SYSTEMS: ROS   Positive for: Endocrine, Eyes Negative for: Constitutional, Gastrointestinal, Neurological, Skin,  Genitourinary, Musculoskeletal, HENT, Cardiovascular, Respiratory, Psychiatric, Allergic/Imm, Heme/Lymph Last edited by Annalee Genta D, COT on 01/02/2022 10:21 AM.     ALLERGIES Allergies  Allergen Reactions   Tomato Rash   PAST MEDICAL HISTORY Past Medical History:  Diagnosis Date   Asthma, chronic 03/30/2012   Essential hypertension, benign 03/30/2012   Hypertension    Seasonal allergies 03/30/2012   Stress incontinence, female 03/30/2012   Tobacco abuse 03/30/2012   History reviewed. No pertinent surgical history.  FAMILY HISTORY History reviewed. No pertinent family history.  SOCIAL  HISTORY Social History   Tobacco Use   Smoking status: Every Day    Packs/day: 0.75    Years: 20.00    Total pack years: 15.00    Types: Cigarettes   Smokeless tobacco: Never   Tobacco comments:    12 cigs per day  Substance Use Topics   Alcohol use: No    Alcohol/week: 0.0 standard drinks of alcohol   Drug use: No       OPHTHALMIC EXAM:  Base Eye Exam     Visual Acuity (Snellen - Linear)       Right Left   Dist [place] 20/25 -2 20/25 -2   Dist ph [place] NI NI         Tonometry (Tonopen, 9:01 AM)       Right Left   Pressure 19 16         Pupils       Pupils Dark Light Shape React APD   Right PERRL 3 2 Round Brisk None   Left PERRL 3 2 Round Brisk None         Visual Fields       Left Right    Full Full         Extraocular Movement       Right Left    Full, Ortho Full, Ortho         Neuro/Psych     Oriented x3: Yes   Mood/Affect: Normal         Dilation     Both eyes: 2.5% Phenylephrine @ 9:01 AM           Slit Lamp and Fundus Exam     Slit Lamp Exam       Right Left   Lids/Lashes milld dermatochalasis milld dermatochalasis   Conjunctiva/Sclera mild pinguecula, mild melanosis mild melanosis   Cornea mild arcus mild arcus   Anterior Chamber deep and clear, narrow temporal angle deep and clear, narrow temporal angle   Iris round and dilated, no NVI round and dilated, no NVI   Lens 2+ NS, 2+CS 2+ NS, 2+CS   Anterior Vitreous mld syneresis mld syneresis         Fundus Exam       Right Left   Disc pink and sharp, +SVP pink and sharp   C/D Ratio 0.2 0.2   Macula flat, good foveal reflex, scattered IRH/DBH, white-centered IRH inferior macula good foveal reflex, scattered DBH, CWS superonasal macula, white-centered IRH inferotemporal macula   Vessels attenuated, tortuous, copper wiring, A/V crossing changes, mild dilation of venules attenuated, tortuous, copper wiring, A/V crossing changes, mild dilation of venules   Periphery  attached, 360 DBH, white centered [facility] temporally attached, 360 DBH, white centered [facility] temporally           IMAGING AND PROCEDURES  Imaging and Procedures for 01/02/2022  OCT, Retina - OU - Both Eyes       Right Eye  Quality was good. Central Foveal Thickness: 242. Progression has no prior data. Findings include normal foveal contour, no IRF, no SRF.   Left Eye Quality was good. Central Foveal Thickness: 240. Progression has no prior data. Findings include normal foveal contour, no IRF, no SRF.   Notes *Images captured and stored on drive  Diagnosis / Impression:  NFP, no IRF/SRF OU  Clinical management:  See below  Abbreviations: NFP - Normal foveal profile. CME - cystoid macular edema. PED - pigment epithelial detachment. IRF - intraretinal fluid. SRF - subretinal fluid. EZ - ellipsoid zone. ERM - epiretinal membrane. ORA - outer retinal atrophy. ORT - outer retinal tubulation. SRHM - subretinal hyper-reflective material. IRHM - intraretinal hyper-reflective material      Fluorescein Angiography Optos (Transit OD)       Right Eye Progression has no prior data. Early phase findings include microaneurysm. Mid/Late phase findings include leakage, microaneurysm (Mild leakage temporal periphery, no NV).   Left Eye Progression has no prior data. Early phase findings include microaneurysm. Mid/Late phase findings include leakage, microaneurysm (Mild leakage temporal periphery, no NV).   Notes **Images stored on drive**  Impression: Moderate NPDR OU  Scattered leaking MA OU mild leakage temporal periphery OU no NV OU           ASSESSMENT/PLAN:    ICD-10-CM   1. Moderate nonproliferative diabetic retinopathy of both eyes without macular edema associated with type 2 diabetes mellitus (HCC)  E11.3393 OCT, Retina - OU - Both Eyes    2. Essential hypertension  I10     3. Retinal hemorrhage of both eyes  H35.63     4. Hypertensive retinopathy of both eyes  H35.033  Fluorescein Angiography Optos (Transit OD)    5. Combined forms of age-related cataract of both eyes  H25.813       1. Moderate nonproliferative diabetic retinopathy w/o DME, both eyes - The incidence, risk factors for progression, natural history and treatment options for diabetic retinopathy were discussed with patient.   - The need for close monitoring of blood glucose, blood pressure, and serum lipids, avoiding cigarette or any type of tobacco, and the need for long term follow up was also discussed with patient. - exam shows scattered IRH/DBH, white-centered IRH inferior macula OD and white-centered IRH temporal periphery; scattered DBH, CWS superonasal macula, white-centered IRH inferotemporal macula and white-centered IRH temporal periphery - FA 12.20.23 shows moderate NPDR OU without macular edema, late phase shows mild temporal leakage OU - OCT no diabetic macular edema, both eyes  - no retinal or ophthalmic interventions indicated or recommended  - f/u in 3 mos -- DFE/OCT  2,3. Hypertensive retinopathy OU - discussed importance of tight BP control - monitor  4. Retinal hemorrhages / Lucina Mellow Spots OU  - dilated exam shows white centered retinal hemorrhages OU, greatest temporally  - white-centered hemorrhages (Roth Spots) can be seen in a variety of systemic conditions w/ a wide differential that includes:  - Subacute bacterial endocarditis, Leukemia, Myeloma, Anemia, Anoxia, Carbon monoxide poisoning, Hypertension, Diabetic retinopathy, HIV retinopathy, Vitamin B12 deficiency, Intracranial hemorrhage from arteriovenous malformation or aneurysm - recommend limited work up to rule out some of these more concerning etiologies: CBC w/ diff, HIV, B12 - will discuss with PCP to coordinate lab work - no retinal or ophthalmic interventions indicated or recommended at this time - will monitor hemorrhages  5. Age related cataracts OU  - The symptoms of cataract, surgical options, and  treatments and risks were discussed with  patient. - discussed diagnosis and progression - monitor  Ophthalmic Meds Ordered this visit:  No orders of the defined types were placed in this encounter.    Return in about 3 months (around 04/03/2022) for DFE, OCT.  There are no Patient Instructions on file for this visit.   Explained the diagnoses, plan, and follow up with the patient and they expressed understanding.  Patient expressed understanding of the importance of proper follow up care.   This document serves as a record of services personally performed by Karie ChimeraBrian G. Brendyn Mclaren, MD, PhD. It was created on their behalf by Glee ArvinAmanda J. Manson PasseyBrown, OA an ophthalmic technician. The creation of this record is the provider's dictation and/or activities during the visit.    Electronically signed by: Glee ArvinAmanda J. Manson PasseyBrown, [place] 12.20.2023 2:44 PM  Karie ChimeraBrian G. Liza Czerwinski, M.D., Ph.D. Diseases & Surgery of the Retina and Vitreous [facility]  I have reviewed the above documentation for accuracy and completeness, and I agree with the above. Karie ChimeraBrian G. Doha Boling, M.D., Ph.D. 01/03/22 2:44 PM  Abbreviations: M myopia (nearsighted); A astigmatism; H hyperopia (farsighted); P presbyopia; Mrx spectacle prescription;  CTL contact lenses; OD right eye; OS left eye; OU both eyes  XT exotropia; ET esotropia; PEK punctate epithelial keratitis; PEE punctate epithelial erosions; DES dry eye syndrome; MGD meibomian gland dysfunction; ATs artificial tears; PFAT's preservative free artificial tears; NSC nuclear sclerotic cataract; PSC posterior subcapsular cataract; ERM epi-retinal membrane; PVD posterior vitreous detachment; RD retinal detachment; DM diabetes mellitus; DR diabetic retinopathy; NPDR non-proliferative diabetic retinopathy; PDR proliferative diabetic retinopathy; CSME clinically significant macular edema; DME diabetic macular edema; dbh dot blot hemorrhages; CWS cotton wool spot; POAG primary open angle  glaucoma; C/D cup-to-disc ratio; HVF humphrey visual field; GVF goldmann visual field; OCT optical coherence tomography; IOP intraocular pressure; BRVO Branch retinal vein occlusion; CRVO central retinal vein occlusion; CRAO central retinal artery occlusion; BRAO branch retinal artery occlusion; RT retinal tear; SB scleral buckle; PPV pars plana vitrectomy; VH Vitreous hemorrhage; PRP panretinal laser photocoagulation; IVK intravitreal kenalog; VMT vitreomacular traction; MH Macular hole;  NVD neovascularization of the disc; NVE neovascularization elsewhere; AREDS age related eye disease study; ARMD age related macular degeneration; POAG primary open angle glaucoma; EBMD epithelial/anterior basement membrane dystrophy; ACIOL anterior chamber intraocular lens; IOL intraocular lens; PCIOL posterior chamber intraocular lens; Phaco/IOL phacoemulsification with intraocular lens placement; PRK photorefractive keratectomy; LASIK laser assisted in situ keratomileusis; HTN hypertension; DM diabetes mellitus; COPD chronic obstructive pulmonary disease

## 2022-01-02 NOTE — Progress Notes (Signed)
Patient presented to clinic with chronic cough for the last 3 months and worsening dyspnea.  PFT ordered and showed moderately severe striction with severe diffusion defect.  Called and reviewed results with patient.  Differentials include ILD.  Patient is a long-term smoker.  Will plan to get high-resolution CT.

## 2022-01-03 ENCOUNTER — Encounter (INDEPENDENT_AMBULATORY_CARE_PROVIDER_SITE_OTHER): Payer: Self-pay | Admitting: Ophthalmology

## 2022-01-04 ENCOUNTER — Other Ambulatory Visit: Payer: Self-pay | Admitting: Student

## 2022-01-04 DIAGNOSIS — H3563 Retinal hemorrhage, bilateral: Secondary | ICD-10-CM

## 2022-01-04 NOTE — Progress Notes (Signed)
Patient recently evaluated in ophthalmology clinic by Dr. Rennis Chris, who identified retinal hemorrhages otherwise known as roth spots during retinal evaluation. Dr. Vanessa Barbara forwarded his findings to me as PCP to ensure further close follow up for further evaluation.   Today, I called the patient who was aware of retinal findings. She is currently feeling at her baseline. She denied fevers, chills, night sweats, general fatigue, shortness of breath, chest pain, palpitations, or changes in vision. No photophobia, HA, or morning time nausea or vomiting. No new arthralgias, myalgias, or weight loss. She denied dental cleanings or GU instrumentation in the past 6 months. Her BP and DM has been managed with outpatient medications, and she remains compliant with them. She denies hx of IV drug use.   A: Ddx for retinal hemorrhages includes bacterial endocarditis, HTN vs diabetic retinopathy, HIV, Leukemia, anemia, myeloma, among others. Patient is not currently exhibiting symptoms of acute distress or systemic infection. However, the high risk some of this ddx carry and need for prompt management, discussed the need for prompt laboratory work up with patient. She is currently on holiday but will be back in town on Monday. Patient agreed to present to the [facility] clinic for laboratory only visit. Will plan management pending laboratory findings  Plan:  follow up 01/09/2022 lab only visit results -cbc with differential -Blood cultures x2 -HIV, B12   Patient was discussed with Dr. Lupita Leash, MD [facility] Internal Medicine Program - PGY-1

## 2022-01-15 ENCOUNTER — Other Ambulatory Visit: Payer: Medicaid Other

## 2022-01-16 ENCOUNTER — Encounter: Payer: Self-pay | Admitting: Dietician

## 2022-01-17 ENCOUNTER — Other Ambulatory Visit: Payer: Self-pay

## 2022-01-17 ENCOUNTER — Observation Stay (HOSPITAL_COMMUNITY)
Admission: EM | Admit: 2022-01-17 | Discharge: 2022-01-19 | Disposition: A | Discharge status: Home / Self Care | Payer: Medicaid Other | Attending: Internal Medicine | Admitting: Internal Medicine

## 2022-01-17 ENCOUNTER — Encounter (HOSPITAL_COMMUNITY): Payer: Self-pay | Admitting: Emergency Medicine

## 2022-01-17 ENCOUNTER — Other Ambulatory Visit (INDEPENDENT_AMBULATORY_CARE_PROVIDER_SITE_OTHER): Payer: Medicaid Other

## 2022-01-17 ENCOUNTER — Emergency Department (HOSPITAL_COMMUNITY): Payer: Medicaid Other

## 2022-01-17 DIAGNOSIS — F17210 Nicotine dependence, cigarettes, uncomplicated: Secondary | ICD-10-CM | POA: Diagnosis not present

## 2022-01-17 DIAGNOSIS — R0602 Shortness of breath: Secondary | ICD-10-CM | POA: Diagnosis not present

## 2022-01-17 DIAGNOSIS — I10 Essential (primary) hypertension: Secondary | ICD-10-CM | POA: Insufficient documentation

## 2022-01-17 DIAGNOSIS — Z79899 Other long term (current) drug therapy: Secondary | ICD-10-CM | POA: Diagnosis not present

## 2022-01-17 DIAGNOSIS — D509 Iron deficiency anemia, unspecified: Principal | ICD-10-CM

## 2022-01-17 DIAGNOSIS — J45909 Unspecified asthma, uncomplicated: Secondary | ICD-10-CM | POA: Insufficient documentation

## 2022-01-17 DIAGNOSIS — D72829 Elevated white blood cell count, unspecified: Secondary | ICD-10-CM | POA: Diagnosis not present

## 2022-01-17 DIAGNOSIS — E876 Hypokalemia: Secondary | ICD-10-CM | POA: Diagnosis not present

## 2022-01-17 DIAGNOSIS — N393 Stress incontinence (female) (male): Secondary | ICD-10-CM | POA: Insufficient documentation

## 2022-01-17 DIAGNOSIS — D649 Anemia, unspecified: Principal | ICD-10-CM | POA: Diagnosis present

## 2022-01-17 DIAGNOSIS — Z7984 Long term (current) use of oral hypoglycemic drugs: Secondary | ICD-10-CM | POA: Insufficient documentation

## 2022-01-17 DIAGNOSIS — E119 Type 2 diabetes mellitus without complications: Secondary | ICD-10-CM | POA: Diagnosis not present

## 2022-01-17 DIAGNOSIS — Z7952 Long term (current) use of systemic steroids: Secondary | ICD-10-CM | POA: Diagnosis not present

## 2022-01-17 DIAGNOSIS — H3563 Retinal hemorrhage, bilateral: Secondary | ICD-10-CM

## 2022-01-17 DIAGNOSIS — Z7982 Long term (current) use of aspirin: Secondary | ICD-10-CM | POA: Insufficient documentation

## 2022-01-17 LAB — COMPREHENSIVE METABOLIC PANEL
ALT: 15 U/L (ref 0–44)
AST: 23 U/L (ref 15–41)
Albumin: 2.9 g/dL — ABNORMAL LOW (ref 3.5–5.0)
Alkaline Phosphatase: 76 U/L (ref 38–126)
Anion gap: 13 (ref 5–15)
BUN: 7 mg/dL (ref 6–20)
CO2: 28 mmol/L (ref 22–32)
Calcium: 8.5 mg/dL — ABNORMAL LOW (ref 8.9–10.3)
Chloride: 93 mmol/L — ABNORMAL LOW (ref 98–111)
Creatinine, Ser: 0.63 mg/dL (ref 0.44–1.00)
GFR, Estimated: >60 mL/min (ref >60)
Glucose, Bld: 96 mg/dL (ref 70–99)
Potassium: 2.9 mmol/L — ABNORMAL LOW (ref 3.5–5.1)
Sodium: 134 mmol/L — ABNORMAL LOW (ref 135–145)
Total Bilirubin: 0.6 mg/dL (ref 0.3–1.2)
Total Protein: 6.8 g/dL (ref 6.5–8.1)

## 2022-01-17 LAB — CBC WITH DIFFERENTIAL/PLATELET
Abs Immature Granulocytes: 0.05 10*3/uL (ref 0.00–0.07)
Abs Immature Granulocytes: 0 10*3/uL (ref 0.00–0.07)
Basophils Absolute: 0 10*3/uL (ref 0.0–0.1)
Basophils Absolute: 0 10*3/uL (ref 0.0–0.1)
Basophils Relative: 0 %
Basophils Relative: 0 %
Eosinophils Absolute: 0.2 10*3/uL (ref 0.0–0.5)
Eosinophils Absolute: 0 10*3/uL (ref 0.0–0.5)
Eosinophils Relative: 2 %
Eosinophils Relative: 0 %
HCT: 15.6 % — ABNORMAL LOW (ref 36.0–46.0)
HCT: 15.8 % — ABNORMAL LOW (ref 36.0–46.0)
Hemoglobin: 4.4 g/dL — CL (ref 12.0–15.0)
Hemoglobin: 4.4 g/dL — CL (ref 12.0–15.0)
Immature Granulocytes: 1 %
Lymphocytes Relative: 22 %
Lymphocytes Relative: 22 %
Lymphs Abs: 2.1 10*3/uL (ref 0.7–4.0)
Lymphs Abs: 2.2 10*3/uL (ref 0.7–4.0)
MCH: 15.6 pg — ABNORMAL LOW (ref 26.0–34.0)
MCH: 15.3 pg — ABNORMAL LOW (ref 26.0–34.0)
MCHC: 28.2 g/dL — ABNORMAL LOW (ref 30.0–36.0)
MCHC: 27.8 g/dL — ABNORMAL LOW (ref 30.0–36.0)
MCV: 55.3 fL — ABNORMAL LOW (ref 80.0–100.0)
MCV: 55.1 fL — ABNORMAL LOW (ref 80.0–100.0)
Monocytes Absolute: 0.8 10*3/uL (ref 0.1–1.0)
Monocytes Absolute: 0.4 10*3/uL (ref 0.1–1.0)
Monocytes Relative: 8 %
Monocytes Relative: 4 %
Neutro Abs: 6.6 10*3/uL (ref 1.7–7.7)
Neutro Abs: 7.3 10*3/uL (ref 1.7–7.7)
Neutrophils Relative %: 67 %
Neutrophils Relative %: 74 %
Platelets: 582 10*3/uL — ABNORMAL HIGH (ref 150–400)
Platelets: 579 10*3/uL — ABNORMAL HIGH (ref 150–400)
RBC: 2.82 MIL/uL — ABNORMAL LOW (ref 3.87–5.11)
RBC: 2.87 MIL/uL — ABNORMAL LOW (ref 3.87–5.11)
RDW: 27.2 % — ABNORMAL HIGH (ref 11.5–15.5)
RDW: 27.5 % — ABNORMAL HIGH (ref 11.5–15.5)
Smear Review: INCREASED
WBC: 9.8 10*3/uL (ref 4.0–10.5)
WBC: 9.8 10*3/uL (ref 4.0–10.5)
nRBC: 0.9 % — ABNORMAL HIGH (ref 0.0–0.2)
nRBC: 0.7 % — ABNORMAL HIGH (ref 0.0–0.2)
nRBC: 1 /100 WBC — ABNORMAL HIGH

## 2022-01-17 LAB — PREPARE RBC (CROSSMATCH)

## 2022-01-17 LAB — TECHNOLOGIST SMEAR REVIEW: Plt Morphology: INCREASED

## 2022-01-17 LAB — DIRECT ANTIGLOBULIN TEST (NOT AT ARMC)
DAT, IgG: NEGATIVE
DAT, complement: NEGATIVE

## 2022-01-17 LAB — IRON AND TIBC
Iron: 15 ug/dL — ABNORMAL LOW (ref 28–170)
Saturation Ratios: 3 % — ABNORMAL LOW (ref 10.4–31.8)
TIBC: 482 ug/dL — ABNORMAL HIGH (ref 250–450)
UIBC: 467 ug/dL

## 2022-01-17 LAB — POC OCCULT BLOOD, ED: Fecal Occult Bld: NEGATIVE

## 2022-01-17 LAB — LACTATE DEHYDROGENASE: LDH: 207 U/L — ABNORMAL HIGH (ref 98–192)

## 2022-01-17 LAB — ABO/RH: ABO/RH(D): A POS

## 2022-01-17 MED ORDER — FLUTICASONE FUROATE-VILANTEROL 200-25 MCG/ACT IN AEPB
1.0000 | INHALATION_SPRAY | Freq: Every day | RESPIRATORY_TRACT | Status: DC
  Administered 2022-01-19: 1 via RESPIRATORY_TRACT
  Filled 2022-01-17: qty 28

## 2022-01-17 MED ORDER — FAMOTIDINE 20 MG PO TABS
20.0000 mg | ORAL_TABLET | Freq: Every day | ORAL | Status: DC
  Administered 2022-01-17 – 2022-01-18 (×2): 20 mg via ORAL
  Filled 2022-01-17 (×2): qty 1

## 2022-01-17 MED ORDER — IPRATROPIUM-ALBUTEROL 0.5-2.5 (3) MG/3ML IN SOLN
3.0000 mL | Freq: Once | RESPIRATORY_TRACT | Status: AC
  Administered 2022-01-17: 3 mL via RESPIRATORY_TRACT
  Filled 2022-01-17: qty 3

## 2022-01-17 MED ORDER — FLUTICASONE FUROATE-VILANTEROL 200-25 MCG/ACT IN AEPB: 1.0000 | INHALATION_SPRAY | Freq: Every day | RESPIRATORY_TRACT | Status: DC

## 2022-01-17 MED ORDER — LORATADINE 10 MG PO TABS
10.0000 mg | ORAL_TABLET | Freq: Every day | ORAL | Status: DC
  Administered 2022-01-17 – 2022-01-19 (×3): 10 mg via ORAL
  Filled 2022-01-17 (×3): qty 1

## 2022-01-17 MED ORDER — SODIUM CHLORIDE 0.9% IV SOLUTION: Freq: Once | INTRAVENOUS | Status: AC

## 2022-01-17 MED ORDER — METFORMIN HCL 500 MG PO TABS
500.0000 mg | ORAL_TABLET | Freq: Two times a day (BID) | ORAL | Status: DC
  Administered 2022-01-18 – 2022-01-19 (×3): 500 mg via ORAL
  Filled 2022-01-17 (×3): qty 1

## 2022-01-17 MED ORDER — ATORVASTATIN CALCIUM 40 MG PO TABS
40.0000 mg | ORAL_TABLET | Freq: Every day | ORAL | Status: DC
  Administered 2022-01-17 – 2022-01-19 (×3): 40 mg via ORAL
  Filled 2022-01-17 (×3): qty 1

## 2022-01-17 MED ORDER — PANTOPRAZOLE SODIUM 40 MG PO TBEC
40.0000 mg | DELAYED_RELEASE_TABLET | Freq: Every morning | ORAL | Status: DC
  Administered 2022-01-18 – 2022-01-19 (×2): 40 mg via ORAL
  Filled 2022-01-17 (×2): qty 1

## 2022-01-17 MED ORDER — POTASSIUM CHLORIDE CRYS ER 20 MEQ PO TBCR
40.0000 meq | EXTENDED_RELEASE_TABLET | Freq: Once | ORAL | Status: AC
  Administered 2022-01-17: 40 meq via ORAL
  Filled 2022-01-17: qty 2

## 2022-01-17 MED ORDER — ORAL CARE MOUTH RINSE: 15.0000 mL | OROMUCOSAL | Status: DC | PRN

## 2022-01-17 MED ORDER — OXYBUTYNIN CHLORIDE ER 5 MG PO TB24
5.0000 mg | ORAL_TABLET | Freq: Two times a day (BID) | ORAL | Status: DC
  Administered 2022-01-17 – 2022-01-19 (×4): 5 mg via ORAL
  Filled 2022-01-17 (×5): qty 1

## 2022-01-17 MED ORDER — MONTELUKAST SODIUM 10 MG PO TABS
10.0000 mg | ORAL_TABLET | Freq: Every day | ORAL | Status: DC
  Administered 2022-01-17 – 2022-01-18 (×2): 10 mg via ORAL
  Filled 2022-01-17 (×2): qty 1

## 2022-01-17 NOTE — H&P (Signed)
Date: 01/17/2022               Patient Name:  Crystal Briggs MRN: [id number]  DOB: Mar 17, 1965 Age / Sex: 56 y.o., female   PCP: Romana Juniper, MD              Medical Service: Internal Medicine Teaching Service              Attending Physician: Dr. Sid Falcon, MD    First Contact: Leonette Nutting, MS 3 Pager: [phone]  Second Contact: Dr. Stann Mainland Pager: [phone]  Third Contact Dr. Howie Ill Pager: [phone]       After Hours (After 5p/  First Contact Pager: [phone]  weekends / holidays): Second Contact Pager: [phone]   Chief Complaint: Abnormal lab result  History of Present Illness: Crystal Briggs is a 57 y.o. with pmhx of asthma, hypertension, stress incontinence, T2DM, and tobacco use, who presents with abnormal lab result. She was getting routine blood work in the hospital from [facility] for retinal changes. She received notice that her tests came back with hemoglobin of 4.4 and was told to come directly to the ED. She denies a past history of low hemoglobin. She reports itchy legs bilaterally x3 weeks that began when she was taking a hot sitz bath with epsom salt. She endorses intermittent SOB that began approximately around the time of a cold in July. She is unable to work in the house or shop in [place] without taking breaks. She denies orthopnea. She endorses bilateral leg swelling that improves with elevation. She has been chewing ice but is unsure when this began. She denies starting new medications or supplements. She denies melena, hematuria, or hematochezia. She has not had a colonoscopy. She does yearly FIT tests that have been negative. She reports menopause was at age 56.  She denies hematuria, dysuria, headache, lightheadedness, vision changes, chest pain, abdominal pain, and joint pain. She has not had recent travel or wooded travel.  ED course: CBC notable for hypochromic microcytic anemia with elevated plts 579. Iron 15, TIBC 482, Saturation 3%, LDH 207,  Haptoglobin pending. Smear notable for teardrop cells and target cells. K+ 2.9. Albumin 2.9. In the ED, patient was wheezing and hypoxic to 90%. She was administered duoneb and placed on 2L [place]. FOBT is negative. She was transfused 3u of blood.   Meds:  Amlodipine 10mg  HCTZ 25 mg Lipitor 40mg  Metformin 500mg  Asa 81mg  Pepcid 20mg  Flonase Advair Singulair 10mg  Oxybutynin 5mg  Pantoprazole 40mg  Zyrtec 10mg  Voltaren gel  No outpatient medications have been marked as taking for the 01/17/22 encounter [facility] Encounter).   Allergies: Allergies as of 01/17/2022 - Review Complete 01/17/2022  Allergen Reaction Noted   Tomato Rash 09/10/2018   Past Medical History:  Diagnosis Date   Asthma, chronic 03/30/2012   Essential hypertension, benign 03/30/2012   Hypertension    Seasonal allergies 03/30/2012   Stress incontinence, female 03/30/2012   Tobacco abuse 03/30/2012    Family History: No cancer. Mom with history of low blood counts.  Social History:  Lives at home with daughter. She does not drive. Is independent with ADLs. She smokes 1/2ppd. She drinks alcohol socially.  Review of Systems: A complete ROS was negative except as per HPI.   Physical Exam: Blood pressure (!) 129/59, pulse 88, temperature 98 F (36.7 C), temperature source Oral, resp. rate 20, last menstrual period 12/14/2015, SpO2 98 %. Gen: Well-appearing laying in bed in no acute distress. HEENT: Pale bilateral conjunctiva. Jaundiced sclera. No  oral jaundice. Cardio: Normal rate. No irregular rhythm. No murmurs or rubs. Normal pedal pulses. Pulm: Bilateral wheezing present. Crackles at bilateral bases. Normal work of breathing. Abd: Nontender, nondistended. Normal bowel sounds. Extr: Mild bilateral lower extremity edema. Skin changes noted on legs: dry, scaly, hardened skin  EKG: personally reviewed my interpretation is rate 90, normal rhythm, normal axis, normal intervals, no ischemic changes.  CXR: personally  reviewed my interpretation is no concern for pneumonia.  Assessment & Plan by Problem:  Crystal Briggs is a 57 y.o. with pmhx of asthma, hypertension, T2DM, stress incontinence, and tobacco use who presents with new onset acute anemia of unclear etiology.  #Microcytic anemia with target and teardrop cells Patient noted to have retinal hemorrhages and roth spots at ophthalmology appointment and was recommended to get work-up including CBC w/ diff. At lab today, patient with Hgb 4.4 and sent to ED with symptoms of wheezing, dyspnea on exertion and jaundice. Patient with new hypochromic microcytic anemia with Hgb 4.4. Plt 579. LDH 207. Haptoglobin pending. Iron 15. TIBC 482. 3% saturation. Smear with teardrop cells and target cells. The differential diagnosis for microcytic anemia includes iron deficiency anemia, anemia of chronic disease, or thalassemias. The differential for target cells on blood smear include liver disease, asplenia, and thalassemia. Other possible diagnoses include acute bleed and hemolytic anemia. Lower suspicion of GI bleed with absence of symptoms and FOBT negative.  Less concern for leukemia given absence of pancytopenia and weight loss. Low concern for TTP or consumptive conditions given thrombocytosis. Plan -Transfusing 3u RBCs now, expect 3u increase in Hgb tomorrow -F/u pst transfusion H & H -Retic count and ferritin -Gel electrophoresis to assess thalassemia/Hgb C -Rule out hemolytic anemia with Coombs test/DAT -Consider further work-up such as EGD/colonoscopy for bleed -Continue supportive respiratory therapy, wean O2 as tolerated -CBC tomorrow, transfusions as needed, goal Hgb 7  #Asthma with new chronic cough She takes Advair daily and Albuterol PRN. PFTs in 12/27/2021 notable for moderately severe restriction with severe diffusion defect with concern for ILD. In ED patient hypoxic to 90% and wheezing. On 2L O2 with SpO2 in high 90s. Administered duoneb.   -Continue home meds -Continue supportive respiratory therapy, wean O2 as tolerated.  #Hypokalemia K+ 2.9. Repletion in ED with 40 mEq oral once.  #Bilateral LE pruritus and LE skin changes Patient with 3 weeks of lower extremity itching. She also notes skin changes and bilateral lower extremity edema. This is likely venous insufficiency given edema, skin changes, scaly/dry/hardened skin, and pruritus. Normal pedal pulses and absence of claudication symptoms reduces likelihood of PAD. Edema could be HF, BNP pending, no recent Echo documented in EHR. -Consider emollient for skin  #Hypertension Patient takes Amlodipine 10mg  and HCTZ 25mg  at home.  #T2DM Patient takes Metformin 500mg  BID. Continue home medication. Last A1C 6.5 (07/18/21).  #Tobacco Use Patient smokes 1/2ppd. -Encourage cessation  #Stress Incontinence Patient takes oxybutynin 5mg . Continue home dose.  Diet: Carb modified IVFs: None DVT: SCDs  Dispo: Admit patient to Observation with expected length of stay less than 2 midnights.  Signed: Leonette Nutting, Medical Student 01/17/2022, 3:50 PM  Pager: [phone] Attestation for Student Documentation:  I personally was present and performed or re-performed the history, physical exam and medical decision-making activities of this service and have verified that the service and findings are accurately documented in the student's note.  Iona Coach, MD 01/17/2022, 6:57 PM

## 2022-01-17 NOTE — Progress Notes (Signed)
Patient sent to clinic for lab work based from [facility] for retinal changes that required further evaluation. Labs drawn this morning, patient was feeling tired. I received a critical result of a hgb of 4.7g. I spoke with the patient and her sister by phone and asked them to come to the ED for transfusion. I have added on a technologist smear to the blood sample to evaluate for hemolysis. Cause of the anemia is unknown at this time, will need evaluation to find and treat the underlying cause. Patient and sister understand. Direct admission is not available today because of a long wait list for hospital beds, so unfortunately they will have to come to the ED.

## 2022-01-17 NOTE — Progress Notes (Signed)
Patient received from ED via stretcher.  Pt is alert and oriented x4, ambulatory.  Walked to the bathroom then to bed with minimal to no assistance.  Noted short of breath on exertion but denied difficulty breathing.  2nd unit of PRBC currently transfusing, tolerated well by patient.  Assisted in position of comfort in bed, 2L [place] in use, saturation 99%.  Oriented to room and unit routine, no complaints at this time.  Needs addressed.

## 2022-01-17 NOTE — ED Notes (Signed)
Patient placed on 2L [place].

## 2022-01-17 NOTE — Addendum Note (Signed)
Addended by: Truddie Crumble on: 01/17/2022 10:11 AM   Modules accepted: Orders

## 2022-01-17 NOTE — Hospital Course (Addendum)
History of Present Illness: Crystal Briggs is a 57 y.o. with pmhx of asthma, hypertension, stress incontinence, T2DM, and tobacco use, who presents with abnormal lab result.Patient noted to have retinal hemorrhages and roth spots at ophthalmology appointment and was recommended to get work-up including CBC w/ diff. At lab 01/17/22, patient with Hgb 4.4 and sent to ED with symptoms of wheezing, dyspnea on exertion and jaundice. She denies a past history of low hemoglobin. She reports itchy legs bilaterally x3 weeks that began when she was taking a hot sitz bath with epsom salt. She endorses intermittent SOB that began approximately around the time of a cold in July. She is unable to work in the house or shop in [place] without taking breaks. She denies orthopnea. She endorses bilateral leg swelling that improves with elevation. She has been chewing ice but is unsure when this began. She denies starting new medications or supplements. She denies melena, hematuria, or hematochezia. She endorses dark stools x2 in the past few weeks. She has not had a colonoscopy. She does yearly FOBT tests that have been negative. She reports menopause was at age 38 with no new vaginal bleeding.   She denies hematuria, dysuria, headache, lightheadedness, vision changes, chest pain, abdominal pain, and joint pain. She denies unintentional weight loss, poor oral intake or loose stools. She has not had recent travel or wooded travel.  #Iron deficiency anemia #Microcytic hypochromic anemia with teardrop and target cells Patient with new hypochromic microcytic anemia with Hgb 4.4. Plt 579. LDH 207. Haptoglobin 264. Iron 15. TIBC 482. 3% saturation. Smear with teardrop cells and target cells. Retic count 0.3, ferritin 5. Iron deficit 2300 total but found to be 1700 accounting for transfusion. Absolute retic count 0.1 indicating hypoproliferation. DAT negative, no hemolysis. The differential for her iron deficiency anemia includes GI  bleed, Celiac's, and poor PO. Do need to rule out malignancy given her age,history of dark stools, and has only gotten annual FOBT for colon cancer screening which have poor sensistivity. On 1/5, patient received 3u of pRBCs with good symptomatic and H/H improvement following transfusion: 4.4 >  6.5>  7.9. On 1/6, patient was started on IV iron infusion 250mg . She completed 2 days of iron infusion without side effects. GI recommending outpatient work-up unless Hemoglobin decreased. It has remained stable at 7.4. Outpatient follow-up with GI was coordinated***   #Asthma with new chronic cough She was started on home medications of Advair daily, Albuterol PRN, and Montelukast 10mg  at night. PFTs in 12/27/2021 notable for moderately severe restriction with severe diffusion defect with concern for ILD. In ED patient hypoxic to 90% and wheezing. She was placed on 2L O2 with SpO2 in high 90s. She was administered duoneb x1 in the ED. She was weaned to room air 1/5 and her respiratory status remained stable.  #Leukocytosis Patient's WBC is 13.1 today from 11.7 yesterday. Her CXR on admission was negative for pneumonia. She denies symptoms of cough and dysuria. She is afebrile. Will continue to monitor for symptoms. Consider f/u outpatient.  #Hypokalemia Potassium was low and repletion ordered.   #Bilateral LE pruritus and LE skin changes Patient with 3 weeks of lower extremity itching. She also notes skin changes and bilateral lower extremity edema. This is likely venous insufficiency given edema, skin changes, scaly/dry/hardened skin, and pruritus. Normal pedal pulses and absence of claudication symptoms reduces likelihood of PAD. Camphor-menthol was started for itching.   #Hypertension Patient takes Amlodipine 10mg  and HCTZ 25mg  at home.   #  T2DM Patient takes Metformin 500mg  BID. Continue home medication. Last A1C 6.5 (07/18/21).   #Tobacco Use Patient smokes 1/2ppd.   #Stress Incontinence Patient  takes oxybutynin 5mg . Continue home dose.

## 2022-01-17 NOTE — ED Triage Notes (Signed)
Pt reports she was told to come to the ER due to low hemoglobin. Pts Hgb 4.4. Pt reports SHOB on exertion. Denies dizziness

## 2022-01-17 NOTE — H&P (Incomplete)
     Date: 01/17/2022               Patient Name:  Crystal Briggs MRN: [id number]  DOB: February 08, 1965 Age / Sex: 57 y.o., female   PCP: Romana Juniper, MD         Medical Service: Internal Medicine Teaching Service         Attending Physician: Dr. Truett Mainland Livingston Diones, MD    First Contact: Dr. [name] Pager: [phone]  Second Contact: Dr. 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 ED Course: Hgb to 4.4 with MCV 55.3, Teardrop and target cells, Meds: *** No outpatient medications have been marked as taking for the 01/17/22 encounter [facility] Encounter).     Allergies: Allergies as of 01/17/2022 - Review Complete 01/17/2022  Allergen Reaction Noted   Tomato Rash 09/10/2018   Past Medical History:  Diagnosis Date   Asthma, chronic 03/30/2012   Essential hypertension, benign 03/30/2012   Hypertension    Seasonal allergies 03/30/2012   Stress incontinence, female 03/30/2012   Tobacco abuse 03/30/2012    Family History: ***  Social History: ***  Review of Systems: A complete ROS was negative except as per HPI. ***  Physical Exam: Blood pressure 115/64, pulse 86, temperature 98.9 F (37.2 C), temperature source Oral, resp. rate 19, last menstrual period 12/14/2015, SpO2 91 %. ***  EKG: personally reviewed my interpretation is***  CXR: personally reviewed my interpretation is***  Assessment & Plan by Problem: Active Problems:   * No active hospital problems. *  Microcytic anemia Hgb to 4.4 with MCV 55.3. Teardrop and target cells noted on smear. Target cells commonly seen in  sickle cell, thalassemia, liver disease. LDH Haptoglobin  Chronic coughDyspnea PFTs with moderate restrictive disease  Bilateral retinal hemorrhage Saw by Dr. Coralyn Pear 12/20 and found to have this. Differential includes: Subacute bacterial endocarditis, Leukemia, Myeloma, Anemia, Anoxia, Carbon monoxide poisoning, Hypertension, Diabetic retinopathy, HIV retinopathy, Vitamin B12 deficiency, Intracranial hemorrhage from arteriovenous malformation or aneurysm   Dispo: Admit patient to {STATUS:3044014::"Observation with expected length of stay less than 2 midnights.","Inpatient with expected length of stay greater than 2 midnights."}  Signed: Iona Coach, MD 01/17/2022, 2:34 PM  Pager: Iona Coach, MD Internal Medicine Resident, PGY-1 [facility]  Pager: [phone] After 5pm on weekdays and 1pm on weekends: On Call pager: [phone]

## 2022-01-17 NOTE — ED Notes (Signed)
5N called requesting purpleman, Charge RN to review and let us know.

## 2022-01-17 NOTE — Patient Instructions (Signed)
Patient instructed to come to the ED for management of severe anemia, she is on her way now.

## 2022-01-17 NOTE — ED Provider Notes (Addendum)
[facility] EMERGENCY DEPARTMENT Provider Note  CSN: [id number] Arrival date & time: 01/17/22 1026  Chief Complaint(s) Abnormal Lab  HPI Crystal Briggs is a 57 y.o. female with history of asthma and hypertension, diabetes presenting to the emergency department with abnormal lab..  Patient reports that he had her labs checked today and was called that she has a hemoglobin of 4.4 and told to come to the emergency department.  She does report around 2 weeks of increasing weakness, some shortness of breath with exertion.  No chest pain, coughing up blood, blood in her stool, black stool, vomiting blood, heavy vaginal bleeding.  She has otherwise been well.  No fevers or chills.  No nausea or vomiting.  Symptoms moderate   Past Medical History Past Medical History:  Diagnosis Date   Asthma, chronic 03/30/2012   Essential hypertension, benign 03/30/2012   Hypertension    Seasonal allergies 03/30/2012   Stress incontinence, female 03/30/2012   Tobacco abuse 03/30/2012   Patient Active Problem List   Diagnosis Date Noted   Chronic cough 12/20/2021   Colon cancer screening 07/18/2021   Hyperpigmentation 08/23/2019   Cervical high risk HPV (human papillomavirus) test positive 09/10/2018   Type 2 diabetes mellitus (HCC) 02/17/2018   Bilateral lower extremity edema 07/22/2017   Osteoarthritis of right knee 11/19/2016   Chronic constipation 11/19/2016   Trace edema 06/18/2016   GERD (gastroesophageal reflux disease) 09/12/2015   Severe obesity (BMI >= 40) (HCC) 11/04/2014   H/O seasonal allergies 10/22/2014   Healthcare maintenance 07/07/2014   Chronic lower back pain 09/30/2013   Asthma, chronic 03/30/2012   Essential hypertension, benign 03/30/2012   Stress incontinence, female 03/30/2012   Cigarette smoker 03/30/2012   Hyperlipidemia 03/30/2012   Home Medication(s) Prior to Admission medications   Medication Sig Start Date End Date Taking? Authorizing Provider   Accu-Chek FastClix Lancets MISC CHECK BLOOD SUGAR 1 TIMES A DAY 02/14/21   Tyson Alias, MD  amLODipine (NORVASC) 10 MG tablet Take 1 tablet (10 mg total) by mouth daily. 05/01/21 05/01/22  Eliezer Bottom, MD  ASPIRIN LOW DOSE 81 MG tablet TAKE 1 TABLET (81 MG TOTAL) BY MOUTH DAILY. SWALLOW WHOLE. 12/17/21   Morene Crocker, MD  atorvastatin (LIPITOR) 40 MG tablet Take 1 tablet (40 mg total) by mouth daily. 05/01/21 05/01/22  Eliezer Bottom, MD  cetirizine (ZYRTEC) 10 MG tablet Take 1 tablet (10 mg total) by mouth daily. 12/17/21   Morene Crocker, MD  diclofenac Sodium (VOLTAREN) 1 % GEL Apply 2 g topically 4 (four) times daily. 09/26/20   Eliezer Bottom, MD  famotidine (PEPCID) 20 MG tablet TAKE 1 TABLET BY MOUTH EVERYDAY AT BEDTIME 06/28/19   Aslam, Leanna Sato, MD  fluticasone (FLONASE) 50 MCG/ACT nasal spray PLACE 1 SPRAY INTO BOTH NOSTRILS DAILY AS NEEDED FOR ALLERGIES OR RHINITIS. 07/18/21   Masters, Florentina Addison, DO  fluticasone-salmeterol (ADVAIR DISKUS) 500-50 MCG/ACT AEPB Use daily and as needed for shortness of breath symptoms. 12/17/21   Morene Crocker, MD  glucose blood (ACCU-CHEK GUIDE) test strip CHECK BLOOD SUGAR TWICE A DAY 12/17/21   Morene Crocker, MD  hydrochlorothiazide (HYDRODIURIL) 25 MG tablet Take 1 tablet (25 mg total) by mouth daily. 12/19/21   Masters, Katie, DO  metFORMIN (GLUCOPHAGE) 500 MG tablet TAKE 1 TABLET BY MOUTH 2 TIMES DAILY WITH A MEAL. 02/07/21   Rehman, Areeg N, DO  mometasone (ELOCON) 0.1 % cream Apply 1 application topically as directed. Bid to aa Right thigh for 2 weeks, then  decrease to qd 5 days a week 01/05/20   Ralene Bathe, MD  montelukast (SINGULAIR) 10 MG tablet Take 1 tablet (10 mg total) by mouth at bedtime. 04/17/20   Harvie Heck, MD  nicotine (NICODERM CQ - DOSED IN MG/24 HOURS) 21 mg/24hr patch PLACE 1 PATCH ONTO THE SKIN DAILY. 10/27/21   Romana Juniper, MD  oxybutynin (DITROPAN-XL) 5 MG 24 hr tablet TAKE 1 TABLET BY MOUTH  TWICE A DAY 09/12/21   Romana Juniper, MD  pantoprazole (PROTONIX) 40 MG tablet Take one tablet daily 12/17/21   Romana Juniper, MD  pimecrolimus (ELIDEL) 1 % cream Apply topically 2 (two) times daily. 06/06/21   Ralene Bathe, MD  polyethylene glycol (MIRALAX / Floria Raveling) packet TAKE 17 GRAMS MIXED IN LIQUID EVERY DAY 05/06/17   Shela Leff, MD                                                                                                                                    Past Surgical History History reviewed. No pertinent surgical history. Family History History reviewed. No pertinent family history.  Social History Social History   Tobacco Use   Smoking status: Every Day    Packs/day: 0.75    Years: 20.00    Total pack years: 15.00    Types: Cigarettes   Smokeless tobacco: Never   Tobacco comments:    12 cigs per day  Substance Use Topics   Alcohol use: No    Alcohol/week: 0.0 standard drinks of alcohol   Drug use: No   Allergies Tomato  Review of Systems Review of Systems  All other systems reviewed and are negative.   Physical Exam Vital Signs  I have reviewed the triage vital signs BP (!) 118/59   Pulse 86   Temp 98 F (36.7 C) (Oral)   Resp 16   LMP 12/14/2015   SpO2 100%  Physical Exam Vitals and nursing note reviewed.  Constitutional:      General: She is not in acute distress.    Appearance: She is well-developed.  HENT:     Head: Normocephalic and atraumatic.     Mouth/Throat:     Mouth: Mucous membranes are moist.  Eyes:     Pupils: Pupils are equal, round, and reactive to light.  Cardiovascular:     Rate and Rhythm: Normal rate and regular rhythm.     Heart sounds: No murmur heard. Pulmonary:     Effort: Pulmonary effort is normal. No respiratory distress.     Breath sounds: Wheezing (mild diffuse wheezing) present.  Abdominal:     General: Abdomen is flat.     Palpations: Abdomen is soft.     Tenderness: There is  no abdominal tenderness.  Musculoskeletal:        General: No tenderness.     Right lower leg: 1+ Pitting Edema present.     Left lower leg: 1+ Pitting  Edema present.  Skin:    General: Skin is warm and dry.  Neurological:     General: No focal deficit present.     Mental Status: She is alert. Mental status is at baseline.  Psychiatric:        Mood and Affect: Mood normal.        Behavior: Behavior normal.     ED Results and Treatments Labs (all labs ordered are listed, but only abnormal results are displayed) Labs Reviewed  CBC WITH DIFFERENTIAL/PLATELET - Abnormal; Notable for the following components:      Result Value   RBC 2.87 (*)    Hemoglobin 4.4 (*)    HCT 15.8 (*)    MCV 55.1 (*)    MCH 15.3 (*)    MCHC 27.8 (*)    RDW 27.5 (*)    Platelets 579 (*)    nRBC 0.7 (*)    nRBC 1 (*)    All other components within normal limits  COMPREHENSIVE METABOLIC PANEL - Abnormal; Notable for the following components:   Sodium 134 (*)    Potassium 2.9 (*)    Chloride 93 (*)    Calcium 8.5 (*)    Albumin 2.9 (*)    All other components within normal limits  HAPTOGLOBIN  LACTATE DEHYDROGENASE  IRON AND TIBC  OCCULT BLOOD X 1 CARD TO LAB, STOOL  BRAIN NATRIURETIC PEPTIDE  POC OCCULT BLOOD, ED  TYPE AND SCREEN  ABO/RH  PREPARE RBC (CROSSMATCH)                                                                                                                          Radiology DG Chest Port 1 View  Result Date: 01/17/2022 CLINICAL DATA:  Wheezing and hypoxia EXAM: PORTABLE CHEST 1 VIEW COMPARISON:  X-ray 11/03/2014 FINDINGS: Enlarged cardiopericardial silhouette with some vascular congestion. No pneumothorax, effusion. Overlapping cardiac leads. Degenerative changes of the right shoulder. IMPRESSION: Enlarged cardiopericardial silhouette with developing mild vascular congestion compared to prior Electronically Signed   By: Karen Kays M.D.   On: 01/17/2022 14:08    Pertinent  labs & imaging results that were available during my care of the patient were reviewed by me and considered in my medical decision making (see MDM for details).  Medications Ordered in ED Medications  0.9 %  sodium chloride infusion (Manually program via Guardrails IV Fluids) (has no administration in time range)  ipratropium-albuterol (DUONEB) 0.5-2.5 (3) MG/3ML nebulizer solution 3 mL (has no administration in time range)  potassium chloride SA (KLOR-CON M) CR tablet 40 mEq (has no administration in time range)  Procedures .Critical Care  Performed by: Cristie Hem, MD Authorized by: Cristie Hem, MD   Critical care provider statement:    Critical care time (minutes):  30   Critical care was necessary to treat or prevent imminent or life-threatening deterioration of the following conditions: severe anemia.   Critical care was time spent personally by me on the following activities:  Development of treatment plan with patient or surrogate, discussions with consultants, evaluation of patient's response to treatment, examination of patient, ordering and review of laboratory studies, ordering and review of radiographic studies, ordering and performing treatments and interventions, pulse oximetry, re-evaluation of patient's condition and review of old charts   Care discussed with: admitting provider     (including critical care time)  Medical Decision Making / ED Course   MDM:  57 year old female presenting to the emergency department with new onset anemia.  Patient well-appearing, vital signs notable for borderline hypoxia.  Patient is slightly wheezy.  Will give neb as patient has history of asthma.  She does have some trace peripheral edema on exam.  Unclear cause, no obvious bleeding source.  Will check hemolysis labs as well.  Her fecal  occult blood test is negative.  Patient consented for blood, will transfuse.  Given significant anemia patient will need admission for further workup to identify cause of anemia.  Labs not consistent with TTP.  Will discuss with internal medicine team as patient follows with internal medicine.  Clinical Course as of 01/17/22 1442  Thu Jan 17, 2022  1440 Discussed with internal medicine teaching service who will admit.  [WS]    Clinical Course User Index [WS] Cristie Hem, MD     Additional history obtained: -Additional history obtained from family -External records from outside source obtained and reviewed including: Chart review including previous notes, labs, imaging, consultation notes including office visit 12/202/23   Lab Tests: -I ordered, reviewed, and interpreted labs.   The pertinent results include:   Labs Reviewed  CBC WITH DIFFERENTIAL/PLATELET - Abnormal; Notable for the following components:      Result Value   RBC 2.87 (*)    Hemoglobin 4.4 (*)    HCT 15.8 (*)    MCV 55.1 (*)    MCH 15.3 (*)    MCHC 27.8 (*)    RDW 27.5 (*)    Platelets 579 (*)    nRBC 0.7 (*)    nRBC 1 (*)    All other components within normal limits  COMPREHENSIVE METABOLIC PANEL - Abnormal; Notable for the following components:   Sodium 134 (*)    Potassium 2.9 (*)    Chloride 93 (*)    Calcium 8.5 (*)    Albumin 2.9 (*)    All other components within normal limits  HAPTOGLOBIN  LACTATE DEHYDROGENASE  IRON AND TIBC  OCCULT BLOOD X 1 CARD TO LAB, STOOL  BRAIN NATRIURETIC PEPTIDE  POC OCCULT BLOOD, ED  TYPE AND SCREEN  ABO/RH  PREPARE RBC (CROSSMATCH)    Notable for severe anemia, mild hypokalemia, negative POCT     Imaging Studies ordered: I ordered imaging studies including CXR On my interpretation imaging demonstrates mild vascular congestion I independently visualized and interpreted imaging. I agree with the radiologist interpretation   Medicines ordered and  prescription drug management: Meds ordered this encounter  Medications   0.9 %  sodium chloride infusion (Manually program via Guardrails IV Fluids)   ipratropium-albuterol (DUONEB) 0.5-2.5 (3) MG/3ML nebulizer solution 3 mL  potassium chloride SA (KLOR-CON M) CR tablet 40 mEq    -I have reviewed the patients home medicines and have made adjustments as needed   Consultations Obtained: I requested consultation with the IMTS,  and discussed lab and imaging findings as well as pertinent plan - they recommend: admission   Cardiac Monitoring: The patient was maintained on a cardiac monitor.  I personally viewed and interpreted the cardiac monitored which showed an underlying rhythm of: NSR  Social Determinants of Health:  Diagnosis or treatment significantly limited by social determinants of health: obesity   Reevaluation: After the interventions noted above, I reevaluated the patient and found that they have improved  Co morbidities that complicate the patient evaluation  Past Medical History:  Diagnosis Date   Asthma, chronic 03/30/2012   Essential hypertension, benign 03/30/2012   Hypertension    Seasonal allergies 03/30/2012   Stress incontinence, female 03/30/2012   Tobacco abuse 03/30/2012      Dispostion: Disposition decision including need for hospitalization was considered, and patient admitted to the hospital.    Final Clinical Impression(s) / ED Diagnoses Final diagnoses:  Symptomatic anemia     This chart was dictated using voice recognition software.  Despite best efforts to proofread,  errors can occur which can change the documentation meaning.    Lonell Grandchild, MD 01/17/22 1441    Lonell Grandchild, MD 01/17/22 [phone]

## 2022-01-17 NOTE — ED Provider Triage Note (Signed)
Emergency Medicine Provider Triage Evaluation Note  Crystal Briggs , a 57 y.o. female  was evaluated in triage.  Pt complains of abnormal lab.  Was evaluated by primary care provider who obtained labs and was found to have a low hemoglobin.  Was told to come to the ED for further evaluation.  Has associated shortness of breath with exertion.  Denies dizziness, palpitations, chest pain, lightheadedness, hemoptysis, hematochezia, blood in vomit.  Denies past medical history of iron deficiency anemia.  Review of patient chart notes that patient hgb was at 4.4 this morning.   Review of Systems  Positive:  Negative:   Physical Exam  BP (!) 115/50   Pulse 88   Temp 98.9 F (37.2 C) (Oral)   Resp (!) 22   LMP 12/14/2015   SpO2 90%  Gen:   Awake, no distress   Resp:  Normal effort  MSK:   Moves extremities without difficulty  Other:   Medical Decision Making  Medically screening exam initiated at 12:16 PM.  Appropriate orders placed.  Hani Patnode Spiers was informed that the remainder of the evaluation will be completed by another provider, this initial triage assessment does not replace that evaluation, and the importance of remaining in the ED until their evaluation is complete.  12:18 PM - Discussed with RN that patient is in need of a room immediately. RN aware and working on room placement.    Eilan Mcinerny A, PA-C 01/17/22 1222

## 2022-01-17 NOTE — Addendum Note (Signed)
Addended by: Lalla Brothers T on: 01/17/2022 10:17 AM   Modules accepted: Orders

## 2022-01-17 NOTE — Addendum Note (Signed)
Addended by: Truddie Crumble on: 01/17/2022 08:57 AM   Modules accepted: Orders

## 2022-01-18 ENCOUNTER — Encounter: Payer: Medicaid Other | Admitting: Student

## 2022-01-18 DIAGNOSIS — D509 Iron deficiency anemia, unspecified: Secondary | ICD-10-CM

## 2022-01-18 LAB — HIV ANTIBODY (ROUTINE TESTING W REFLEX)
HIV Screen 4th Generation wRfx: NONREACTIVE
HIV Screen 4th Generation wRfx: NONREACTIVE

## 2022-01-18 LAB — CBC
HCT: 24.8 % — ABNORMAL LOW (ref 36.0–46.0)
Hemoglobin: 7.9 g/dL — ABNORMAL LOW (ref 12.0–15.0)
MCH: 20.2 pg — ABNORMAL LOW (ref 26.0–34.0)
MCHC: 31.9 g/dL (ref 30.0–36.0)
MCV: 63.4 fL — ABNORMAL LOW (ref 80.0–100.0)
Platelets: 482 10*3/uL — ABNORMAL HIGH (ref 150–400)
RBC: 3.91 MIL/uL (ref 3.87–5.11)
RDW: 33.7 % — ABNORMAL HIGH (ref 11.5–15.5)
WBC: 11.7 10*3/uL — ABNORMAL HIGH (ref 4.0–10.5)
nRBC: 0.7 % — ABNORMAL HIGH (ref 0.0–0.2)

## 2022-01-18 LAB — BASIC METABOLIC PANEL
Anion gap: 10 (ref 5–15)
Anion gap: 12 (ref 5–15)
BUN: <5 mg/dL — ABNORMAL LOW (ref 6–20)
BUN: <5 mg/dL — ABNORMAL LOW (ref 6–20)
CO2: 27 mmol/L (ref 22–32)
CO2: 26 mmol/L (ref 22–32)
Calcium: 8.6 mg/dL — ABNORMAL LOW (ref 8.9–10.3)
Calcium: 8.5 mg/dL — ABNORMAL LOW (ref 8.9–10.3)
Chloride: 98 mmol/L (ref 98–111)
Chloride: 94 mmol/L — ABNORMAL LOW (ref 98–111)
Creatinine, Ser: 0.7 mg/dL (ref 0.44–1.00)
Creatinine, Ser: 0.69 mg/dL (ref 0.44–1.00)
GFR, Estimated: >60 mL/min (ref >60)
GFR, Estimated: >60 mL/min (ref >60)
Glucose, Bld: 92 mg/dL (ref 70–99)
Glucose, Bld: 119 mg/dL — ABNORMAL HIGH (ref 70–99)
Potassium: 3.6 mmol/L (ref 3.5–5.1)
Potassium: 3.7 mmol/L (ref 3.5–5.1)
Sodium: 135 mmol/L (ref 135–145)
Sodium: 132 mmol/L — ABNORMAL LOW (ref 135–145)

## 2022-01-18 LAB — COMPREHENSIVE METABOLIC PANEL
ALT: 14 U/L (ref 0–44)
AST: 22 U/L (ref 15–41)
Albumin: 2.8 g/dL — ABNORMAL LOW (ref 3.5–5.0)
Alkaline Phosphatase: 74 U/L (ref 38–126)
Anion gap: 10 (ref 5–15)
BUN: 6 mg/dL (ref 6–20)
CO2: 29 mmol/L (ref 22–32)
Calcium: 8.6 mg/dL — ABNORMAL LOW (ref 8.9–10.3)
Chloride: 94 mmol/L — ABNORMAL LOW (ref 98–111)
Creatinine, Ser: 0.57 mg/dL (ref 0.44–1.00)
GFR, Estimated: >60 mL/min (ref >60)
Glucose, Bld: 113 mg/dL — ABNORMAL HIGH (ref 70–99)
Potassium: 3.2 mmol/L — ABNORMAL LOW (ref 3.5–5.1)
Sodium: 133 mmol/L — ABNORMAL LOW (ref 135–145)
Total Bilirubin: 0.7 mg/dL (ref 0.3–1.2)
Total Protein: 6.8 g/dL (ref 6.5–8.1)

## 2022-01-18 LAB — HEMOGLOBIN AND HEMATOCRIT, BLOOD
HCT: 21 % — ABNORMAL LOW (ref 36.0–46.0)
HCT: 24.4 % — ABNORMAL LOW (ref 36.0–46.0)
Hemoglobin: 6.5 g/dL — CL (ref 12.0–15.0)
Hemoglobin: 7.4 g/dL — ABNORMAL LOW (ref 12.0–15.0)

## 2022-01-18 LAB — HEPATITIS PANEL, ACUTE
HCV Ab: NONREACTIVE
Hep A IgM: NONREACTIVE
Hep B C IgM: NONREACTIVE
Hepatitis B Surface Ag: NONREACTIVE

## 2022-01-18 LAB — RETIC PANEL
Immature Retic Fract: 24.3 % — ABNORMAL HIGH (ref 2.3–15.9)
RBC.: 3.3 MIL/uL — ABNORMAL LOW (ref 3.87–5.11)
Retic Ct Pct: 0.3 % — ABNORMAL LOW (ref 0.4–3.1)
Reticulocyte Hemoglobin: 19.6 pg — ABNORMAL LOW (ref >27.9)

## 2022-01-18 LAB — VITAMIN B12: Vitamin B-12: 447 pg/mL (ref 232–1245)

## 2022-01-18 LAB — HAPTOGLOBIN: Haptoglobin: 264 mg/dL (ref 33–346)

## 2022-01-18 LAB — FERRITIN: Ferritin: 5 ng/mL — ABNORMAL LOW (ref 11–307)

## 2022-01-18 LAB — BRAIN NATRIURETIC PEPTIDE: B Natriuretic Peptide: 124.7 pg/mL — ABNORMAL HIGH (ref 0.0–100.0)

## 2022-01-18 MED ORDER — POTASSIUM CHLORIDE CRYS ER 20 MEQ PO TBCR
40.0000 meq | EXTENDED_RELEASE_TABLET | Freq: Two times a day (BID) | ORAL | Status: AC
  Administered 2022-01-18 (×2): 40 meq via ORAL
  Filled 2022-01-18 (×2): qty 2

## 2022-01-18 MED ORDER — CAMPHOR-MENTHOL 0.5-0.5 % EX LOTN
TOPICAL_LOTION | CUTANEOUS | Status: DC | PRN
  Filled 2022-01-18: qty 222

## 2022-01-18 MED ORDER — SODIUM CHLORIDE 0.9 % IV SOLN
250.0000 mg | Freq: Every day | INTRAVENOUS | Status: DC
  Administered 2022-01-18 – 2022-01-19 (×2): 250 mg via INTRAVENOUS
  Filled 2022-01-18 (×2): qty 20

## 2022-01-18 NOTE — Consult Note (Signed)
Referring Provider: Dr. Saverio Danker Primary Care Physician:  Pcp, No Primary Gastroenterologist:  Dr. Therisa Doyne  Reason for Consultation:  Anemia  HPI: Crystal Briggs is a 57 y.o. female with symptomatic anemia with SOB and weakness without any overt bleeding. Heme NEG. Hgb 4.4 with low MCV. Transfusion of 3 U PRBCs to 7.9. Denies abdominal pain, N/V, hematemesis, hematochezia, or melena. Denies NSAIDs. Rare alcohol. See in 2018 by Dr. Therisa Doyne and screening colon recommended but she cancelled the procedure and has never had a colonoscopy. Receiving iron infusion at this time.  Past Medical History:  Diagnosis Date   Asthma, chronic 03/30/2012   Essential hypertension, benign 03/30/2012   Hypertension    Seasonal allergies 03/30/2012   Stress incontinence, female 03/30/2012   Tobacco abuse 03/30/2012    History reviewed. No pertinent surgical history.  Prior to Admission medications   Medication Sig Start Date End Date Taking? Authorizing Provider  acetaminophen (TYLENOL) 500 MG tablet Take 500 mg by mouth daily as needed for fever or headache (pain).   Yes [provider]  albuterol (VENTOLIN HFA) 108 (90 Base) MCG/ACT inhaler Inhale 2 puffs into the lungs every 6 (six) hours as needed for wheezing or shortness of breath.   Yes [provider]  amLODipine (NORVASC) 10 MG tablet Take 1 tablet (10 mg total) by mouth daily. Patient taking differently: Take 10 mg by mouth in the morning. 05/01/21 05/01/22 Yes Aslam, Sadia, MD  ASPIRIN LOW DOSE 81 MG tablet TAKE 1 TABLET (81 MG TOTAL) BY MOUTH DAILY. SWALLOW WHOLE. Patient taking differently: Take 81 mg by mouth in the morning. 12/17/21  Yes Romana Juniper, MD  atorvastatin (LIPITOR) 40 MG tablet Take 1 tablet (40 mg total) by mouth daily. Patient taking differently: Take 40 mg by mouth in the morning. 05/01/21 05/01/22 Yes Aslam, Loralyn Freshwater, MD  cetirizine (ZYRTEC) 10 MG tablet Take 1 tablet (10 mg total) by mouth daily. Patient taking  differently: Take 10 mg by mouth in the morning. 12/17/21  Yes Romana Juniper, MD  diclofenac Sodium (VOLTAREN) 1 % GEL Apply 2 g topically 4 (four) times daily. Patient taking differently: Apply 2 g topically 4 (four) times daily as needed (pain). 09/26/20  Yes Aslam, Loralyn Freshwater, MD  famotidine (PEPCID) 20 MG tablet TAKE 1 TABLET BY MOUTH EVERYDAY AT BEDTIME Patient taking differently: Take 20 mg by mouth at bedtime. 06/28/19  Yes Aslam, Sadia, MD  fluticasone (FLONASE) 50 MCG/ACT nasal spray PLACE 1 SPRAY INTO BOTH NOSTRILS DAILY AS NEEDED FOR ALLERGIES OR RHINITIS. 07/18/21  Yes Masters, Katie, DO  fluticasone-salmeterol (ADVAIR DISKUS) 500-50 MCG/ACT AEPB Use daily and as needed for shortness of breath symptoms. Patient taking differently: Inhale 1 puff into the lungs 2 (two) times daily. 12/17/21  Yes Romana Juniper, MD  hydrochlorothiazide (HYDRODIURIL) 25 MG tablet Take 1 tablet (25 mg total) by mouth daily. Patient taking differently: Take 25 mg by mouth at bedtime. 12/19/21  Yes Masters, Katie, DO  metFORMIN (GLUCOPHAGE) 500 MG tablet TAKE 1 TABLET BY MOUTH 2 TIMES DAILY WITH A MEAL. Patient taking differently: Take 500 mg by mouth 2 (two) times daily with a meal. 02/07/21  Yes Rehman, Areeg N, DO  mometasone (ELOCON) 0.1 % cream Apply 1 application topically as directed. Bid to aa Right thigh for 2 weeks, then decrease to qd 5 days a week Patient taking differently: Apply 1 application  topically See admin instructions. Apply topically 5 days a week 01/05/20  Yes Ralene Bathe, MD  montelukast [facility])  10 MG tablet Take 1 tablet (10 mg total) by mouth at bedtime. Patient taking differently: Take 10 mg by mouth in the morning. 04/17/20  Yes Aslam, Loralyn Freshwater, MD  naproxen sodium (ALEVE) 220 MG tablet Take 220 mg by mouth daily as needed.   Yes [provider]  oxybutynin (DITROPAN-XL) 5 MG 24 hr tablet TAKE 1 TABLET BY MOUTH TWICE A DAY Patient taking differently: Take 5 mg by  mouth 2 (two) times daily. 09/12/21  Yes Romana Juniper, MD  pantoprazole (PROTONIX) 40 MG tablet Take one tablet daily Patient taking differently: Take 40 mg by mouth in the morning. 12/17/21  Yes Romana Juniper, MD  pimecrolimus (ELIDEL) 1 % cream Apply topically 2 (two) times daily. 06/06/21  Yes Ralene Bathe, MD  polyethylene glycol (MIRALAX / GLYCOLAX) packet TAKE 17 GRAMS MIXED IN LIQUID EVERY DAY Patient taking differently: Take 17 g by mouth daily as needed (constipation). 05/06/17  Yes Shela Leff, MD    Scheduled Meds:  atorvastatin  40 mg Oral Daily   famotidine  20 mg Oral QHS   fluticasone furoate-vilanterol  1 puff Inhalation Daily   loratadine  10 mg Oral Daily   metFORMIN  500 mg Oral BID WC   montelukast  10 mg Oral QHS   oxybutynin  5 mg Oral BID   pantoprazole  40 mg Oral q AM   potassium chloride  40 mEq Oral BID   Continuous Infusions:  ferric gluconate (FERRLECIT) IVPB 250 mg (01/18/22 1550)   PRN Meds:.camphor-menthol, mouth rinse  Allergies as of 01/17/2022 - Review Complete 01/17/2022  Allergen Reaction Noted   Tomato Itching and Rash 09/10/2018    History reviewed. No pertinent family history.  Social History   Socioeconomic History   Marital status: Single    Spouse name: Not on file   Number of children: Not on file   Years of education: Not on file   Highest education level: Not on file  Occupational History   Not on file  Tobacco Use   Smoking status: Every Day    Packs/day: 0.75    Years: 20.00    Total pack years: 15.00    Types: Cigarettes   Smokeless tobacco: Never   Tobacco comments:    12 cigs per day  Substance and Sexual Activity   Alcohol use: No    Alcohol/week: 0.0 standard drinks of alcohol   Drug use: No   Sexual activity: Yes  Other Topics Concern   Not on file  Social History Narrative   Not on file   Social Determinants of Health   Financial Resource Strain: Not on file  Food  Insecurity: No Food Insecurity (01/17/2022)   Hunger Vital Sign    Worried About Running Out of Food in the Last Year: Never true    Ran Out of Food in the Last Year: Never true  Recent Concern: Food Insecurity - Food Insecurity Present (12/19/2021)   Hunger Vital Sign    Worried About Running Out of Food in the Last Year: Sometimes true    Ran Out of Food in the Last Year: Sometimes true  Transportation Needs: No Transportation Needs (01/17/2022)   PRAPARE - Hydrologist (Medical): No    Lack of Transportation (Non-Medical): No  Physical Activity: Not on file  Stress: Not on file  Social Connections: Moderately Integrated (12/19/2021)   Social Connection and Isolation Panel [NHANES]    Frequency of Communication with Friends and Family:  More than three times a week    Frequency of Social Gatherings with Friends and Family: More than three times a week    Attends Religious Services: More than 4 times per year    Active Member of [employer] or Organizations: No    Attends Archivist Meetings: Never    Marital Status: Living with partner  Intimate Partner Violence: Not At Risk (01/17/2022)   Humiliation, Afraid, Rape, and Kick questionnaire    Fear of Current or Ex-Partner: No    Emotionally Abused: No    Physically Abused: No    Sexually Abused: No    Review of Systems: All negative except as stated above in HPI.  Physical Exam: Vital signs: Vitals:   01/18/22 1503 01/18/22 1504  BP: (!) 141/98 (!) 141/98  Pulse: 90 91  Resp:  15  Temp:  98 F (36.7 C)  SpO2: 94% 96%   Last BM Date : 01/16/22 General:   Lethargic, obese, no acute distress  Head: normocephalic, atraumatic Eyes: anicteric sclera ENT: oropharynx clear Neck: supple, nontender Lungs:  Clear throughout to auscultation.   No wheezes, crackles, or rhonchi. No acute distress. Heart:  Regular rate and rhythm; no murmurs, clicks, rubs,  or gallops. Abdomen: soft, nontender,  nondistended, +BS  Rectal:  Deferred Ext: no edema  GI:  Lab Results: Recent Labs    01/17/22 0908 01/17/22 1240 01/18/22 0028 01/18/22 0652  WBC 9.8 9.8  --  11.7*  HGB 4.4* 4.4* 6.5* 7.9*  HCT 15.6* 15.8* 21.0* 24.8*  PLT 582* 579*  --  482*   BMET Recent Labs    01/17/22 1240 01/18/22 0028  NA 134* 133*  K 2.9* 3.2*  CL 93* 94*  CO2 28 29  GLUCOSE 96 113*  BUN 7 6  CREATININE 0.63 0.57  CALCIUM 8.5* 8.6*   LFT Recent Labs    01/18/22 0028  PROT 6.8  ALBUMIN 2.8*  AST 22  ALT 14  ALKPHOS 74  BILITOT 0.7   PT/INR No results for input(s): "LABPROT", "INR" in the last 72 hours.   Studies/Results: DG Chest Port 1 View  Result Date: 01/17/2022 CLINICAL DATA:  Wheezing and hypoxia EXAM: PORTABLE CHEST 1 VIEW COMPARISON:  X-ray 11/03/2014 FINDINGS: Enlarged cardiopericardial silhouette with some vascular congestion. No pneumothorax, effusion. Overlapping cardiac leads. Degenerative changes of the right shoulder. IMPRESSION: Enlarged cardiopericardial silhouette with developing mild vascular congestion compared to prior Electronically Signed   By: Jill Side M.D.   On: 01/17/2022 14:08    Impression/Plan: Severe symptomatic microcytic anemia without any overt bleeding - needs a GI work up for her anemia but no signs of overt bleeding to need it urgently this weekend. If her Hgb remains stable then do as an outpt in the near future. If her anemia worsens then will need EGD/colonoscopy as an inpt early next week. If Hgb drops on AM labs please change her to clear liquid diet. Dr. Watt Climes on call for [place] this weekend, please call him if Hgb worsens.    LOS: 1 day   Lear Ng  01/18/2022, 4:43 PM  Questions please call [phone]

## 2022-01-18 NOTE — TOC Initial Note (Signed)
Transition of Care [facility]) - Initial/Assessment Note    Patient Details  Name: Crystal Briggs MRN: [id number] Date of Birth: Sep 02, 1965  Transition of [facility]) CM/SW Contact:    Ninfa Meeker, RN Phone Number: 01/18/2022, 11:13 AM  Clinical Narrative:                  Transition of Care Screening Note:Transition of Care [facility]) Department has reviewed patient and no TOC needs have been identified at this time. We will continue to monitor patient advancement through Interdisciplinary progressions and if new patient needs arise, please place a consult.        Patient Goals and CMS Choice            Expected Discharge Plan and Services                                              Prior Living Arrangements/Services                       Activities of Daily Living Home Assistive Devices/Equipment: None ADL Screening (condition at time of admission) Patient's cognitive ability adequate to safely complete daily activities?: Yes Is the patient deaf or have difficulty hearing?: No Does the patient have difficulty seeing, even when wearing glasses/contacts?: No Does the patient have difficulty concentrating, remembering, or making decisions?: No Patient able to express need for assistance with ADLs?: Yes Does the patient have difficulty dressing or bathing?: No Independently performs ADLs?: Yes (appropriate for developmental age) Does the patient have difficulty walking or climbing stairs?: No Weakness of Legs: None Weakness of Arms/Hands: None  Permission Sought/Granted                  Emotional Assessment              Admission diagnosis:  Symptomatic anemia [D64.9] Patient Active Problem List   Diagnosis Date Noted   Symptomatic anemia 01/17/2022   Chronic cough 12/20/2021   Colon cancer screening 07/18/2021   Hyperpigmentation 08/23/2019   Cervical high risk HPV (human papillomavirus) test positive 09/10/2018   Type 2 diabetes  mellitus ([place]) 02/17/2018   Bilateral lower extremity edema 07/22/2017   Osteoarthritis of right knee 11/19/2016   Chronic constipation 11/19/2016   Trace edema 06/18/2016   GERD (gastroesophageal reflux disease) 09/12/2015   Severe obesity (BMI >= 40) ([place]) 11/04/2014   H/O seasonal allergies 10/22/2014   Healthcare maintenance 07/07/2014   Chronic lower back pain 09/30/2013   Asthma, chronic 03/30/2012   Essential hypertension, benign 03/30/2012   Stress incontinence, female 03/30/2012   Cigarette smoker 03/30/2012   Hyperlipidemia 03/30/2012   PCP:  Pcp, No Pharmacy:   [id number] - [place], [place] [address] [place] [address] Phone: [phone] Fax: [phone]     Social Determinants of Health (SDOH) Social History: SDOH Screenings   Food Insecurity: No Food Insecurity (01/17/2022)  Recent Concern: Food Insecurity - Food Insecurity Present (12/19/2021)  Housing: Low Risk  (01/17/2022)  Transportation Needs: No Transportation Needs (01/17/2022)  Utilities: Not At Risk (01/17/2022)  Depression (PHQ2-9): Low Risk  (07/18/2021)  Social Connections: Moderately Integrated (12/19/2021)  Tobacco Use: High Risk (01/17/2022)   SDOH Interventions:     Readmission Risk Interventions     No data to display

## 2022-01-18 NOTE — Progress Notes (Signed)
Subjective: States she is feeling "wonderful" and feels better than yesterday. Denies SOB. She endorses wheezing. Has not been getting her inhalers while here. She reports leg itching without leg swelling. She denies dizziness, lightheadedness, chest pain, or belly pain. Has not had a bowel movement since admission. She saw dark brown stools once or twice weeks ago. She has no vaginal bleeding. She takes Tylenol, last took Alleve and Advil months ago.  She reports intentional weight loss of 10-15 lbs by eating better. She denies bloating or loose stools. Her diet is pretty balanced with fruits and vegetables.   Objective: Vital signs in last 24 hours: Vitals:   01/18/22 0030 01/18/22 0215 01/18/22 0251 01/18/22 0527  BP: 120/66 121/69 114/61 132/64  Pulse: 84 88 84 85  Resp: 18 20 16 16   Temp: 98.2 F (36.8 C) 98.1 F (36.7 C) 98.2 F (36.8 C) 98.2 F (36.8 C)  TempSrc: Oral Oral Oral Oral  SpO2: 96% 94% 100% 91%  Weight:      Height:       Weight change:   Intake/Output Summary (Last 24 hours) at 01/18/2022 0720 Last data filed at 01/18/2022 0865 Gross per 24 hour  Intake 745.67 ml  Output --  Net 745.67 ml   Physical exam General: Well-appearing in no acute distress. Sitting upright in chair. Lungs: Normal wob,Wheezing noted in all lung fields bilaterally, no crackles appreciated. Cardio: Normal rate. Normal rhythm. No murmurs. Abd: Nontender, nondistended.  Extr: Lower extremity skin is dry, cracked, and hardened.  Assessment/Plan: Crystal Briggs is a 57 y.o. with pmhx of asthma, hypertension, T2DM, stress incontinence, and tobacco use who presents with low Hgb 4.4, now with microcytic hypochromic anemia consistent with iron deficiency anemia with unknown etiology.  #Iron deficiency anemia #Microcytic hypochromic anemia with teardrop and target cells Patient noted to have retinal hemorrhages and roth spots at ophthalmology appointment and was recommended to get  work-up including CBC w/ diff. At lab 01/17/22, patient with Hgb 4.4 and sent to ED with symptoms of wheezing, dyspnea on exertion and jaundice. Patient with new hypochromic microcytic anemia with Hgb 4.4. Plt 579. LDH 207. Haptoglobin 264. Iron 15. TIBC 482. 3% saturation. Smear with teardrop cells and target cells. Retic count 0.3, ferritin 5. Iron deficit 2300 total but found to be 1700 accounting for transfusion. Absolute retic count 0.1 indicating hypoproliferation. DAT negative, no hemolysis. The differential for her iron deficiency anemia includes GI bleed, Celiac's, and poor PO. Do need to rule out malignancy given her age,history of dark stools, and has only gotten annual FOBT for colon cancer screening which have poor sensistivity. Patient with good symptomatic and H/H improvement following transfusion of 3u pRBCs: 4.4 >  6.5>  7.9. Will continue to monitor CBC and symptoms. Will consult GI. -Start IV iron infusion 250mg  QID -CBC daily -GI consult pending  #Asthma with new chronic cough She takes Advair daily, Albuterol PRN, and Montelukast 10mg  at night. PFTs in 12/27/2021 notable for moderately severe restriction with severe diffusion defect with concern for ILD. In ED patient hypoxic to 90% and wheezing. She was placed on 2L O2 with SpO2 in high 90s. She was administered duoneb x1 in the ED. Today, patient denies dyspnea. Her respiratory status has improved and her SpO2 remains in the 90s on room air. She endorses wheezing. -Continue home meds, restarted Montelukast today. -Continue supportive respiratory therapy as needed   #Hypokalemia K+ 3.2. Repletion ordered.   #Bilateral LE pruritus and LE skin changes  Patient with 3 weeks of lower extremity itching. She also notes skin changes and bilateral lower extremity edema. This is likely venous insufficiency given edema, skin changes, scaly/dry/hardened skin, and pruritus. Normal pedal pulses and absence of claudication symptoms reduces  likelihood of PAD. -Start camphor-menthol PRN for itching   #Hypertension Patient takes Amlodipine 10mg  and HCTZ 25mg  at home.   #T2DM Patient takes Metformin 500mg  BID. Continue home medication. Last A1C 6.5 (07/18/21).   #Tobacco Use Patient smokes 1/2ppd. -Encourage cessation   #Stress Incontinence Patient takes oxybutynin 5mg . Continue home dose.   Diet: Carb modified IVFs: None DVT: SCDs   LOS: 1 day   Leonette Nutting, Medical Student 01/18/2022, 7:20 AM Attestation for Student Documentation:  I personally was present and performed or re-performed the history, physical exam and medical decision-making activities of this service and have verified that the service and findings are accurately documented in the student's note.  Iona Coach, MD 01/18/2022, 3:34 PM

## 2022-01-19 DIAGNOSIS — D509 Iron deficiency anemia, unspecified: Principal | ICD-10-CM

## 2022-01-19 LAB — CBC
HCT: 24.8 % — ABNORMAL LOW (ref 36.0–46.0)
Hemoglobin: 7.4 g/dL — ABNORMAL LOW (ref 12.0–15.0)
MCH: 19.6 pg — ABNORMAL LOW (ref 26.0–34.0)
MCHC: 29.8 g/dL — ABNORMAL LOW (ref 30.0–36.0)
MCV: 65.8 fL — ABNORMAL LOW (ref 80.0–100.0)
Platelets: 493 10*3/uL — ABNORMAL HIGH (ref 150–400)
RBC: 3.77 MIL/uL — ABNORMAL LOW (ref 3.87–5.11)
RDW: 34.5 % — ABNORMAL HIGH (ref 11.5–15.5)
WBC: 13.1 10*3/uL — ABNORMAL HIGH (ref 4.0–10.5)
nRBC: 0.8 % — ABNORMAL HIGH (ref 0.0–0.2)

## 2022-01-19 LAB — TYPE AND SCREEN
ABO/RH(D): A POS
Antibody Screen: NEGATIVE
Unit division: 0
Unit division: 0
Unit division: 0

## 2022-01-19 LAB — BPAM RBC
Blood Product Expiration Date: 202401262359
Blood Product Expiration Date: 202401262359
Blood Product Expiration Date: 202401262359
ISSUE DATE / TIME: 202401041422
ISSUE DATE / TIME: 202401041740
ISSUE DATE / TIME: 202401050227
Unit Type and Rh: 6200
Unit Type and Rh: 6200
Unit Type and Rh: 6200

## 2022-01-19 MED ORDER — POLYSACCHARIDE IRON COMPLEX 150 MG PO CAPS: 150.0000 mg | ORAL_CAPSULE | ORAL | 0 refills | Status: DC

## 2022-01-19 MED ORDER — POLYSACCHARIDE IRON COMPLEX 150 MG PO CAPS: 150.0000 mg | ORAL_CAPSULE | ORAL | Status: DC

## 2022-01-19 NOTE — Discharge Summary (Signed)
Name: Crystal Briggs MRN: [id number] DOB: 08-Feb-1965 57 y.o. PCP: Pcp, No  Date of Admission: 01/17/2022 12:00 PM Date of Discharge: 01/19/22 Attending Physician: Reymundo Poll, MD  Discharge Diagnosis: 1. Principal Problem:   Symptomatic anemia Active Problems:   Iron deficiency anemia   Discharge Medications: Allergies as of 01/19/2022       Reactions   Tomato Itching, Rash        Medication List     STOP taking these medications    amLODipine 10 MG tablet Commonly known as: NORVASC   Aspirin Low Dose 81 MG tablet Generic drug: aspirin EC   hydrochlorothiazide 25 MG tablet Commonly known as: HYDRODIURIL   naproxen sodium 220 MG tablet Commonly known as: ALEVE       TAKE these medications    acetaminophen 500 MG tablet Commonly known as: TYLENOL Take 500 mg by mouth daily as needed for fever or headache (pain).   albuterol 108 (90 Base) MCG/ACT inhaler Commonly known as: VENTOLIN HFA Inhale 2 puffs into the lungs every 6 (six) hours as needed for wheezing or shortness of breath.   atorvastatin 40 MG tablet Commonly known as: Lipitor Take 1 tablet (40 mg total) by mouth daily. What changed: when to take this   cetirizine 10 MG tablet Commonly known as: ZYRTEC Take 1 tablet (10 mg total) by mouth daily. What changed: when to take this   diclofenac Sodium 1 % Gel Commonly known as: VOLTAREN Apply 2 g topically 4 (four) times daily. What changed:  when to take this reasons to take this   famotidine 20 MG tablet Commonly known as: PEPCID TAKE 1 TABLET BY MOUTH EVERYDAY AT BEDTIME What changed: See the new instructions.   fluticasone 50 MCG/ACT nasal spray Commonly known as: FLONASE PLACE 1 SPRAY INTO BOTH NOSTRILS DAILY AS NEEDED FOR ALLERGIES OR RHINITIS.   fluticasone-salmeterol 500-50 MCG/ACT Aepb Commonly known as: Advair Diskus Use daily and as needed for shortness of breath symptoms. What changed:  how much to take how to  take this when to take this additional instructions   iron polysaccharides 150 MG capsule Commonly known as: NIFEREX Take 1 capsule (150 mg total) by mouth every Monday, Wednesday, and Friday. Start taking on: January 21, 2022   metFORMIN 500 MG tablet Commonly known as: GLUCOPHAGE TAKE 1 TABLET BY MOUTH 2 TIMES DAILY WITH A MEAL.   mometasone 0.1 % cream Commonly known as: ELOCON Apply 1 application topically as directed. Bid to aa Right thigh for 2 weeks, then decrease to qd 5 days a week What changed:  when to take this additional instructions   montelukast 10 MG tablet Commonly known as: SINGULAIR Take 1 tablet (10 mg total) by mouth at bedtime. What changed: when to take this   oxybutynin 5 MG 24 hr tablet Commonly known as: DITROPAN-XL TAKE 1 TABLET BY MOUTH TWICE A DAY   pantoprazole 40 MG tablet Commonly known as: PROTONIX Take one tablet daily What changed:  how much to take how to take this when to take this additional instructions   pimecrolimus 1 % cream Commonly known as: Elidel Apply topically 2 (two) times daily.   polyethylene glycol 17 g packet Commonly known as: MIRALAX / GLYCOLAX TAKE 17 GRAMS MIXED IN LIQUID EVERY DAY What changed: See the new instructions.        Disposition and follow-up:   Crystal Briggs was discharged from [facility] in Stable condition.  At the hospital  follow up visit please address:  1.  Iron deficiency anemia-She received 3 units PRBCs during admission and IV iron as well.       Iron deficit 1200 at discharge. She was started on PO iron supplementation.  2.  Leukocytosis-WBCs elevated at 13.9 on day of discharge.  Chest x-ray note with no signs concerning for pneumonia patient denies shortness of breath, cough, and dysuria.  2.  Labs / imaging needed at time of follow-up: repeat CBC, a1c  3.  Pending labs/ test needing follow-up: SPEP  Follow-up Appointments:  GI [facility]- note sent to  front desk. She has an appt 1/17 but would like to have earlier if possible.  Hospital Course by problem list:  Iron deficiency anemia Microcytic hypochromic anemia with teardrop an target cells Patient was noted to have retinal hemorrhages in spots at ophthalmology appointment and recommended to get workup with CBC with differential.  Labs resulted Jan 4 and showed hemoglobin at 4.4.  In the ED she endorsed intermittent shortness of breath that has been present since July.  She has also been chewing ice for an unknown amount of time.  She denied NSAID use and rarely drinks alcohol.  She denies melena, hematuria, or hematochezia.  She did have dark stools several weeks ago twice.   She previously followed with Dr. Pati Gallo and screening colonoscopy was recommended in 2018 but she canceled procedure.  She underwent menopause about 5 years ago.  Fecal occult was negative in the emergency room.  Retic count 0.3, ferritin 5. She was transfused 3 units PRBCs and hemoglobin remained stable at 7.4.  GI was consulted and Dr. Bosie Clos saw patient.  With hemoglobin remaining stable his recommendations were for her to complete EGD/colonoscopy as outpatient.  Asthma PFT with currently severe restriction 12/23 Medications include Advair daily and montelukast 10 mg.  PFT in December notable for moderately severe restriction with severe diffusion defect.  She is a current smoker not interested in cessation.  With her age would like to further evaluate with high-resolution CT in outpatient setting due to concern for ILD.  Leukocytosis Patient's WBC is 13.1 today from 11.7 yesterday. Her CXR on admission was negative for pneumonia. She denies symptoms of cough and dysuria. She is afebrile. Will continue to monitor for symptoms.   Hypokalemia Potassium was low and repletion ordered.   Bilateral LE pruritus and LE skin changes Patient with 3 weeks of lower extremity itching. She also notes skin changes and bilateral  lower extremity edema. This is likely venous insufficiency given edema, skin changes, scaly/dry/hardened skin, and pruritus. Normal pedal pulses and absence of claudication symptoms reduces likelihood of PAD. Camphor-menthol was started for itching.   Hypertension Patient takes Amlodipine 10mg  and HCTZ 25mg  at home.   T2DM Patient takes Metformin 500mg  BID. Continue home medication. Last A1C 6.5 (07/18/21).   Tobacco Use Patient smokes 1/2ppd.   Stress Incontinence Patient takes oxybutynin 5mg . Continue home dose.   Discharge Exam:   BP 118/61 (BP Location: Right Arm)   Pulse 92   Temp 98.2 F (36.8 C) (Oral)   Resp 18   Ht 5' (1.524 m)   Wt 99.1 kg   LMP 12/14/2015   SpO2 94%   BMI 42.65 kg/m  Discharge exam:  Constitutional: well-appearing, sitting up in bed Eyes: pale conjunctiva Cardiovascular: regular rate and rhythm, no m/r/g Pulmonary/Chest: normal work of breathing on room air, lungs clear to auscultation bilaterally Abdominal: soft, non-tender, non-distended MSK: trace edema to lower extremities bilaterally  Pertinent Labs, Studies, and Procedures:     Latest Ref Rng & Units 01/19/2022    3:42 AM 01/18/2022    4:46 PM 01/18/2022    6:52 AM  CBC  WBC 4.0 - 10.5 K/uL 13.1   11.7   Hemoglobin 12.0 - 15.0 g/dL 7.4  7.4  7.9   Hematocrit 36.0 - 46.0 % 24.8  24.4  24.8   Platelets 150 - 400 K/uL 493   482        Latest Ref Rng & Units 01/18/2022    4:46 PM 01/18/2022   10:17 AM 01/18/2022   12:28 AM  BMP  Glucose 70 - 99 mg/dL 119  92  113   BUN 6 - 20 mg/dL <5  <5  6   Creatinine 0.44 - 1.00 mg/dL 0.69  0.70  0.57   Sodium 135 - 145 mmol/L 132  135  133   Potassium 3.5 - 5.1 mmol/L 3.7  3.6  3.2   Chloride 98 - 111 mmol/L 94  98  94   CO2 22 - 32 mmol/L 26  27  29    Calcium 8.9 - 10.3 mg/dL 8.5  8.6  8.6     Tech smear with teardrop cells Ferritin 5 Fecal occult negative Iron 15, TIBC 482, sat ratios 3 BNP 124 LDH 207 Haptoglobin 264  Discharge  Instructions: Discharge Instructions     Diet - low sodium heart healthy   Complete by: As directed    Discharge instructions   Complete by: As directed    Ms. Cryderman,  You were recently admitted to [facility] for low blood count. You were given 3 bags of blood and your blood count has been holding level since then.  You will need a colonoscopy and endoscopy with GI once you are out of the hospital.  Please call [facility] at [phone] to make that appointment.  I have sent a message to the Internal Medicine Clinic. You currently have an appt with is 01/17, but I would like you to be seen sooner to recheck blood work if possible.   Continue taking your home medications with the following changes  Stop: you did not get amlodipine or hydrochlorothiazide while you were in the hospital and your blood pressure looked normal. Please hold these meds and we can see if we need to restart them at the follow-up appointment.  Start taking: Iron pill, take this every other day. If can cause GI upset or constipation so spacing out helps people tolerate. We may need to get you set up for some additional IV iron in the future.  You should seek further medical care if develop worsening shortness of breath or fatigue.   Sincerely, Christiana Fuchs, DO   Increase activity slowly   Complete by: As directed        Signed: Christiana Fuchs, DO 01/19/2022, 11:59 AM   Pager: [phone]

## 2022-01-21 LAB — HGB FRACTIONATION CASCADE
Hgb A2: 1.5 % — ABNORMAL LOW (ref 1.8–3.2)
Hgb A: 98.5 % (ref 96.4–98.8)
Hgb F: 0 % (ref 0.0–2.0)
Hgb S: 0 %

## 2022-01-24 LAB — CULTURE, BLOOD (SINGLE)

## 2022-01-24 NOTE — Progress Notes (Signed)
Patient called and aware of resulted labs after recent hospitalization for anemia. Reminded patient of 1/17 appointment with me. Will plan to repeat labs in office

## 2022-01-25 ENCOUNTER — Other Ambulatory Visit: Payer: Self-pay | Admitting: Internal Medicine

## 2022-01-29 NOTE — Progress Notes (Deleted)
Fe deficiency anemia (Ferritin 5,  Microcytic, hypochromic anemia with target cells and dacrocytes Retinal hemorrhages per ophthalmologist with follow up CBC and differential with Hgb 4.4 and smear as above. SOB since July, chewing on ice, denies melena. FOBT negative in ER Patient with Hgb of 7.4 at DC s/p 3pRBCs Iron deficit 1200 at discharge.    -Referral to GI for  EGD/colonoscopy  Leukocytosis  HTN Amlopidine 10 mg HCTZ 25 mg   DM Metfomin 500 mg BID  B/LE pruritus and LE skin changes  Asthma PFT with currently severe restriction 12/23

## 2022-01-30 ENCOUNTER — Encounter: Payer: Medicaid Other | Admitting: Student

## 2022-02-01 NOTE — Telephone Encounter (Signed)
Patient missed HFU 1/17. Please reschedule appt for this when able.

## 2022-02-05 ENCOUNTER — Ambulatory Visit (HOSPITAL_COMMUNITY): Payer: Medicaid Other | Attending: Internal Medicine

## 2022-02-05 NOTE — Telephone Encounter (Signed)
Appointment will not be rescheduled.  Per Crystal Briggs patient has transferred her care.

## 2022-02-13 ENCOUNTER — Other Ambulatory Visit: Payer: Self-pay | Admitting: Nurse Practitioner

## 2022-02-13 DIAGNOSIS — Z1231 Encounter for screening mammogram for malignant neoplasm of breast: Secondary | ICD-10-CM

## 2022-02-27 ENCOUNTER — Encounter: Payer: Self-pay | Admitting: Dietician

## 2022-03-17 ENCOUNTER — Other Ambulatory Visit: Payer: Self-pay | Admitting: Internal Medicine

## 2022-03-17 ENCOUNTER — Other Ambulatory Visit: Payer: Self-pay | Admitting: Student

## 2022-03-17 DIAGNOSIS — Z720 Tobacco use: Secondary | ICD-10-CM

## 2022-03-18 ENCOUNTER — Ambulatory Visit
Admission: RE | Admit: 2022-03-18 | Discharge: 2022-03-18 | Disposition: A | Discharge status: Home / Self Care | Payer: Medicaid Other | Source: Ambulatory Visit | Attending: Nurse Practitioner | Admitting: Nurse Practitioner

## 2022-03-18 DIAGNOSIS — Z1231 Encounter for screening mammogram for malignant neoplasm of breast: Secondary | ICD-10-CM

## 2022-03-20 NOTE — Progress Notes (Signed)
[facility] Note  04/03/2022     CHIEF COMPLAINT Patient presents for Retina Follow Up   HISTORY OF PRESENT ILLNESS: Crystal Briggs is a 57 y.o. female who presents to the clinic today for:   HPI     Retina Follow Up   Patient presents with  Diabetic Retinopathy.  In both eyes.  This started 3 months ago.  Duration of 3 months.  Since onset it is stable.  I, the attending physician,  performed the HPI with the patient and updated documentation appropriately.        Comments   3 month retina follow up NPDR pt is reporting no vision changes noticed she denies any flashes or floaters she is reporting good glycemic control between 80 and 90 range her last A1C 6.0 January 2024      Last edited by Bernarda Caffey, MD on 04/03/2022 12:53 PM.     Pt states she is seeing a new doctor now. She had an upper and lower 'scope'. Had a spot removed. She doesn't wish to return to the Internal Medicine at [facility]. She has been going to [facility] for care.   Referring physician: Romana Juniper, MD [place],  [place] [id number]  HISTORICAL INFORMATION:   Selected notes from the MEDICAL RECORD NUMBER Referred by Dr. Zenia Resides for intraretinal hemorrhages OU LEE:  Ocular Hx- PMH-    CURRENT MEDICATIONS: No current outpatient medications on file. (Ophthalmic Drugs)   No current facility-administered medications for this visit. (Ophthalmic Drugs)   Current Outpatient Medications (Other)  Medication Sig   acetaminophen (TYLENOL) 500 MG tablet Take 500 mg by mouth daily as needed for fever or headache (pain).   albuterol (VENTOLIN HFA) 108 (90 Base) MCG/ACT inhaler Inhale 2 puffs into the lungs every 6 (six) hours as needed for wheezing or shortness of breath.   atorvastatin (LIPITOR) 40 MG tablet TAKE 1 TABLET BY MOUTH EVERY DAY   cetirizine (ZYRTEC) 10 MG tablet Take 1 tablet (10 mg total) by mouth daily. (Patient taking differently: Take 10  mg by mouth in the morning.)   diclofenac Sodium (VOLTAREN) 1 % GEL Apply 2 g topically 4 (four) times daily. (Patient taking differently: Apply 2 g topically 4 (four) times daily as needed (pain).)   famotidine (PEPCID) 20 MG tablet TAKE 1 TABLET BY MOUTH EVERYDAY AT BEDTIME (Patient taking differently: Take 20 mg by mouth at bedtime.)   fluticasone (FLONASE) 50 MCG/ACT nasal spray PLACE 1 SPRAY INTO BOTH NOSTRILS DAILY AS NEEDED FOR ALLERGIES OR RHINITIS.   fluticasone-salmeterol (ADVAIR DISKUS) 500-50 MCG/ACT AEPB Use daily and as needed for shortness of breath symptoms. (Patient taking differently: Inhale 1 puff into the lungs 2 (two) times daily.)   iron polysaccharides (NIFEREX) 150 MG capsule TAKE 1 CAPSULE (150 MG TOTAL) BY MOUTH EVERY MONDAY, WEDNESDAY, AND FRIDAY.   metFORMIN (GLUCOPHAGE) 500 MG tablet TAKE 1 TABLET BY MOUTH 2 TIMES DAILY WITH A MEAL. (Patient taking differently: Take 500 mg by mouth 2 (two) times daily with a meal.)   mometasone (ELOCON) 0.1 % cream Apply 1 application topically as directed. Bid to aa Right thigh for 2 weeks, then decrease to qd 5 days a week (Patient taking differently: Apply 1 application  topically See admin instructions. Apply topically 5 days a week)   montelukast (SINGULAIR) 10 MG tablet TAKE 1 TABLET BY MOUTH EVERYDAY AT BEDTIME   oxybutynin (DITROPAN-XL) 5 MG 24 hr tablet TAKE  1 TABLET BY MOUTH TWICE A DAY (Patient taking differently: Take 5 mg by mouth 2 (two) times daily.)   pantoprazole (PROTONIX) 40 MG tablet Take one tablet daily (Patient taking differently: Take 40 mg by mouth in the morning.)   pimecrolimus (ELIDEL) 1 % cream Apply topically 2 (two) times daily.   polyethylene glycol (MIRALAX / GLYCOLAX) packet TAKE 17 GRAMS MIXED IN LIQUID EVERY DAY (Patient taking differently: Take 17 g by mouth daily as needed (constipation).)   No current facility-administered medications for this visit. (Other)   REVIEW OF SYSTEMS: ROS   Positive for:  Endocrine, Eyes Negative for: Constitutional, Gastrointestinal, Neurological, Skin, Genitourinary, Musculoskeletal, HENT, Cardiovascular, Respiratory, Psychiatric, Allergic/Imm, Heme/Lymph Last edited by Parthenia Ames, COT on 04/03/2022  9:11 AM.     ALLERGIES Allergies  Allergen Reactions   Tomato Itching and Rash   PAST MEDICAL HISTORY Past Medical History:  Diagnosis Date   Asthma, chronic 03/30/2012   Essential hypertension, benign 03/30/2012   Hypertension    Seasonal allergies 03/30/2012   Stress incontinence, female 03/30/2012   Tobacco abuse 03/30/2012   History reviewed. No pertinent surgical history.  FAMILY HISTORY History reviewed. No pertinent family history.  SOCIAL HISTORY Social History   Tobacco Use   Smoking status: Every Day    Packs/day: 0.75    Years: 20.00    Additional pack years: 0.00    Total pack years: 15.00    Types: Cigarettes   Smokeless tobacco: Never   Tobacco comments:    12 cigs per day  Substance Use Topics   Alcohol use: No    Alcohol/week: 0.0 standard drinks of alcohol   Drug use: No       OPHTHALMIC EXAM:  Base Eye Exam     Visual Acuity (Snellen - Linear)       Right Left   Dist [place] 20/25 20/25 -2         Tonometry (Tonopen, 9:14 AM)       Right Left   Pressure 13 18         Pupils       Pupils Dark Light Shape React APD   Right PERRL 3 2 Round Brisk None   Left PERRL 3 2 Round Brisk None         Visual Fields       Left Right    Full Full         Extraocular Movement       Right Left    Full, Ortho Full, Ortho         Neuro/Psych     Oriented x3: Yes   Mood/Affect: Normal         Dilation     Both eyes: 2.5% Phenylephrine @ 9:14 AM           Slit Lamp and Fundus Exam     Slit Lamp Exam       Right Left   Lids/Lashes milld dermatochalasis milld dermatochalasis   Conjunctiva/Sclera mild pinguecula, mild melanosis mild melanosis   Cornea mild arcus mild arcus,  Trace Punctate epithelial erosions   Anterior Chamber deep and clear, narrow temporal angle deep and clear, narrow temporal angle   Iris round and dilated, no NVI round and dilated, no NVI   Lens 2+ NS, 2+CS 2+ NS, 2-3+CS   Anterior Vitreous mld syneresis mld syneresis         Fundus Exam       Right Left  Disc pink and sharp pink and sharp   C/D Ratio 0.2 0.2   Macula flat, good foveal reflex, scattered MA/DBH--improved. good foveal reflex, scattered DBH, CWS superonasal macula, white-centered IRH inferotemporal macula--all resolved; no heme or edema.   Vessels attenuated, tortuous, copper wiring, A/V crossing changes, mild dilation of venules attenuated, tortuous, copper wiring, A/V crossing changes, mild dilation of venules   Periphery attached, 360 DBH and white centered [facility] temporally--improved; hemes resolved. attached, 360 DBH, white centered [facility] temporally--vastly improved, almost resolved.           IMAGING AND PROCEDURES  Imaging and Procedures for 04/03/2022  OCT, Retina - OU - Both Eyes       Right Eye Quality was good. Central Foveal Thickness: 241. Progression has been stable. Findings include normal foveal contour, no IRF, no SRF.   Left Eye Quality was good. Central Foveal Thickness: 245. Progression has been stable. Findings include normal foveal contour, no IRF, no SRF.   Notes *Images captured and stored on drive  Diagnosis / Impression:  NFP, no IRF/SRF OU  Clinical management:  See below  Abbreviations: NFP - Normal foveal profile. CME - cystoid macular edema. PED - pigment epithelial detachment. IRF - intraretinal fluid. SRF - subretinal fluid. EZ - ellipsoid zone. ERM - epiretinal membrane. ORA - outer retinal atrophy. ORT - outer retinal tubulation. SRHM - subretinal hyper-reflective material. IRHM - intraretinal hyper-reflective material      Color Fundus Photography Optos - OU - Both Eyes       Right Eye Progression has no prior data.  Disc findings include normal observations. Macula : normal observations. Vessels : tortuous vessels, attenuated. Periphery : hemorrhage (Fading Roth Spots. Improved from Dec 2023 imgs. ).   Left Eye Progression has no prior data. Disc findings include normal observations. Macula : normal observations. Vessels : tortuous vessels, attenuated. Periphery : hemorrhage (Fading Roth Spots. Improved from Dec 2023 imgs. ).   Notes **Images stored on drive**  Impression: Fading IRH and Roth Spots OU --. Improved from Dec 2023 images             ASSESSMENT/PLAN:    ICD-10-CM   1. Moderate nonproliferative diabetic retinopathy of both eyes without macular edema associated with type 2 diabetes mellitus (HCC)  [id number] OCT, Retina - OU - Both Eyes    Color Fundus Photography Optos - OU - Both Eyes    2. Essential hypertension  I10     3. Retinal hemorrhage of both eyes  H35.63     4. Hypertensive retinopathy of both eyes  H35.033     5. Combined forms of age-related cataract of both eyes  H25.813      1. Moderate nonproliferative diabetic retinopathy w/o DME, both eyes - The incidence, risk factors for progression, natural history and treatment options for diabetic retinopathy were discussed with patient.   - The need for close monitoring of blood glucose, blood pressure, and serum lipids, avoiding cigarette or any type of tobacco, and the need for long term follow up was also discussed with patient. - FA 12.20.23 shows moderate NPDR OU without macular edema, late phase shows mild temporal leakage OU - exam today shows interval improvement in retinal hemorrhages OU - OCT no diabetic macular edema, both eyes  - no retinal or ophthalmic interventions indicated or recommended  - f/u in 3-4 mos -- DFE/OCT  2,3. Hypertensive retinopathy OU - discussed importance of tight BP control - monitor  4. Retinal hemorrhages / Jabier Mutton  Spots OU  - dilated exam 12.2.23 showed white centered retinal  hemorrhages OU, greatest temporally  - white-centered hemorrhages (Roth Spots) can be seen in a variety of systemic conditions w/ a wide differential that includes:  - Subacute bacterial endocarditis, Leukemia, Myeloma, Anemia, Anoxia, Carbon monoxide poisoning, Hypertension, Diabetic retinopathy, HIV retinopathy, Vitamin B12 deficiency, Intracranial hemorrhage from arteriovenous malformation or aneurysm - pt was referred to PCP for lab work which showed Hgb of 4.4 -- pt was admitted and transfused with 3 units of pRBCs - on exam today, interval improvement in retinal hemorrhages and Roth spots essentially resolved - no retinal or ophthalmic interventions indicated or recommended at this time - monitor  5. Age related cataracts OU  - The symptoms of cataract, surgical options, and treatments and risks were discussed with patient. - discussed diagnosis and progression - monitor  Ophthalmic Meds Ordered this visit:  No orders of the defined types were placed in this encounter.    Return for 3-4 months NPDR/Ret Hemes OU, DFE, OCT.  There are no Patient Instructions on file for this visit.   Explained the diagnoses, plan, and follow up with the patient and they expressed understanding.  Patient expressed understanding of the importance of proper follow up care.   This document serves as a record of services personally performed by Gardiner Sleeper, MD, PhD. It was created on their behalf by Orvan Falconer, an ophthalmic technician. The creation of this record is the provider's dictation and/or activities during the visit.    Electronically signed by: Orvan Falconer, OA, 04/03/22  12:55 PM  This document serves as a record of services personally performed by Gardiner Sleeper, MD, PhD. It was created on their behalf by San Jetty. Owens Shark, OA an ophthalmic technician. The creation of this record is the provider's dictation and/or activities during the visit.    Electronically signed by: San Jetty. Owens Shark, [place] 03.20.2024 12:55 PM  Gardiner Sleeper, M.D., Ph.D. Diseases & Surgery of the Retina and Vitreous [facility]  I have reviewed the above documentation for accuracy and completeness, and I agree with the above. Gardiner Sleeper, M.D., Ph.D. 04/03/22 1:00 PM   Abbreviations: M myopia (nearsighted); A astigmatism; H hyperopia (farsighted); P presbyopia; Mrx spectacle prescription;  CTL contact lenses; OD right eye; OS left eye; OU both eyes  XT exotropia; ET esotropia; PEK punctate epithelial keratitis; PEE punctate epithelial erosions; DES dry eye syndrome; MGD meibomian gland dysfunction; ATs artificial tears; PFAT's preservative free artificial tears; [place] nuclear sclerotic cataract; PSC posterior subcapsular cataract; ERM epi-retinal membrane; PVD posterior vitreous detachment; RD retinal detachment; DM diabetes mellitus; DR diabetic retinopathy; NPDR non-proliferative diabetic retinopathy; PDR proliferative diabetic retinopathy; CSME clinically significant macular edema; DME diabetic macular edema; dbh dot blot hemorrhages; CWS cotton wool spot; POAG primary open angle glaucoma; C/D cup-to-disc ratio; HVF humphrey visual field; GVF goldmann visual field; OCT optical coherence tomography; IOP intraocular pressure; BRVO Branch retinal vein occlusion; CRVO central retinal vein occlusion; CRAO central retinal artery occlusion; BRAO branch retinal artery occlusion; RT retinal tear; SB scleral buckle; PPV pars plana vitrectomy; VH Vitreous hemorrhage; PRP panretinal laser photocoagulation; IVK intravitreal kenalog; VMT vitreomacular traction; MH Macular hole;  NVD neovascularization of the disc; NVE neovascularization elsewhere; AREDS age related eye disease study; ARMD age related macular degeneration; POAG primary open angle glaucoma; EBMD epithelial/anterior basement membrane dystrophy; ACIOL anterior chamber intraocular lens; IOL intraocular lens; PCIOL posterior chamber  intraocular lens; Phaco/IOL phacoemulsification with intraocular  lens placement; [place] photorefractive keratectomy; LASIK laser assisted in situ keratomileusis; HTN hypertension; DM diabetes mellitus; COPD chronic obstructive pulmonary disease

## 2022-03-21 ENCOUNTER — Other Ambulatory Visit: Payer: Self-pay | Admitting: Nurse Practitioner

## 2022-03-21 DIAGNOSIS — R928 Other abnormal and inconclusive findings on diagnostic imaging of breast: Secondary | ICD-10-CM

## 2022-03-28 ENCOUNTER — Other Ambulatory Visit: Payer: Self-pay | Admitting: Internal Medicine

## 2022-04-03 ENCOUNTER — Encounter (INDEPENDENT_AMBULATORY_CARE_PROVIDER_SITE_OTHER): Payer: Self-pay | Admitting: Ophthalmology

## 2022-04-03 ENCOUNTER — Ambulatory Visit (INDEPENDENT_AMBULATORY_CARE_PROVIDER_SITE_OTHER): Payer: Medicaid Other | Admitting: Ophthalmology

## 2022-04-03 DIAGNOSIS — E113393 Type 2 diabetes mellitus with moderate nonproliferative diabetic retinopathy without macular edema, bilateral: Secondary | ICD-10-CM | POA: Diagnosis not present

## 2022-04-03 DIAGNOSIS — H3563 Retinal hemorrhage, bilateral: Secondary | ICD-10-CM

## 2022-04-03 DIAGNOSIS — H35033 Hypertensive retinopathy, bilateral: Secondary | ICD-10-CM | POA: Diagnosis not present

## 2022-04-03 DIAGNOSIS — H25813 Combined forms of age-related cataract, bilateral: Secondary | ICD-10-CM

## 2022-04-03 DIAGNOSIS — I10 Essential (primary) hypertension: Secondary | ICD-10-CM

## 2022-04-08 ENCOUNTER — Ambulatory Visit
Admission: RE | Admit: 2022-04-08 | Discharge: 2022-04-08 | Disposition: A | Discharge status: Home / Self Care | Payer: Medicaid Other | Source: Ambulatory Visit | Attending: Nurse Practitioner | Admitting: Nurse Practitioner

## 2022-04-08 ENCOUNTER — Other Ambulatory Visit: Payer: Self-pay | Admitting: Nurse Practitioner

## 2022-04-08 DIAGNOSIS — N6310 Unspecified lump in the right breast, unspecified quadrant: Secondary | ICD-10-CM

## 2022-04-08 DIAGNOSIS — R928 Other abnormal and inconclusive findings on diagnostic imaging of breast: Secondary | ICD-10-CM

## 2022-04-23 ENCOUNTER — Other Ambulatory Visit: Payer: Medicaid Other

## 2022-05-01 ENCOUNTER — Ambulatory Visit
Admission: RE | Admit: 2022-05-01 | Discharge: 2022-05-01 | Disposition: A | Discharge status: Home / Self Care | Payer: Medicaid Other | Source: Ambulatory Visit | Attending: Nurse Practitioner | Admitting: Nurse Practitioner

## 2022-05-01 DIAGNOSIS — N6310 Unspecified lump in the right breast, unspecified quadrant: Secondary | ICD-10-CM

## 2022-05-01 DIAGNOSIS — R928 Other abnormal and inconclusive findings on diagnostic imaging of breast: Secondary | ICD-10-CM

## 2022-05-01 HISTORY — PX: BREAST BIOPSY: SHX20

## 2022-05-07 ENCOUNTER — Other Ambulatory Visit: Payer: Self-pay

## 2022-05-07 DIAGNOSIS — R21 Rash and other nonspecific skin eruption: Secondary | ICD-10-CM

## 2022-05-07 MED ORDER — PIMECROLIMUS 1 % EX CREA: TOPICAL_CREAM | Freq: Two times a day (BID) | CUTANEOUS | 0 refills | Status: AC

## 2022-05-07 NOTE — Progress Notes (Signed)
Patient left message needing RF. aw

## 2022-06-06 ENCOUNTER — Ambulatory Visit: Payer: Medicaid Other | Admitting: Dermatology

## 2022-06-17 ENCOUNTER — Other Ambulatory Visit: Payer: Self-pay | Admitting: Dermatology

## 2022-06-17 DIAGNOSIS — R21 Rash and other nonspecific skin eruption: Secondary | ICD-10-CM

## 2022-06-22 ENCOUNTER — Other Ambulatory Visit: Payer: Self-pay | Admitting: Internal Medicine

## 2022-07-31 ENCOUNTER — Encounter (INDEPENDENT_AMBULATORY_CARE_PROVIDER_SITE_OTHER): Payer: Medicaid Other | Admitting: Ophthalmology

## 2022-07-31 DIAGNOSIS — H35033 Hypertensive retinopathy, bilateral: Secondary | ICD-10-CM

## 2022-07-31 DIAGNOSIS — E113393 Type 2 diabetes mellitus with moderate nonproliferative diabetic retinopathy without macular edema, bilateral: Secondary | ICD-10-CM

## 2022-07-31 DIAGNOSIS — I10 Essential (primary) hypertension: Secondary | ICD-10-CM

## 2022-07-31 DIAGNOSIS — H3563 Retinal hemorrhage, bilateral: Secondary | ICD-10-CM

## 2022-07-31 DIAGNOSIS — H25813 Combined forms of age-related cataract, bilateral: Secondary | ICD-10-CM

## 2022-07-31 NOTE — Progress Notes (Signed)
[facility] - Clinic Note  08/06/2022     CHIEF COMPLAINT Patient presents for Retina Follow Up   HISTORY OF PRESENT ILLNESS: Crystal Briggs is a 57 y.o. female who presents to the clinic today for:   HPI     Retina Follow Up   Patient presents with  Diabetic Retinopathy.  In both eyes.  This started 4 months ago.  I, the attending physician,  performed the HPI with the patient and updated documentation appropriately.        Comments   Patient here for 4 months retina follow up for NPDR OU. Patient states vision doing great. No eye not using drops.       Last edited by Rennis Chris, MD on 08/09/2022  2:05 AM.    Pt states her blood counts are good, she is not having any problems, she states her new dr is in the process of working on her and trying to figure out where she was losing blood from, her blood sugar numbers are good also  Referring physician: Olivia Canter, MD [address] [place],  [place] [address]  HISTORICAL INFORMATION:   Selected notes from the MEDICAL RECORD NUMBER Referred by Dr. Laruth Bouchard for intraretinal hemorrhages OU LEE:  Ocular Hx- PMH-    CURRENT MEDICATIONS: No current outpatient medications on file. (Ophthalmic Drugs)   No current facility-administered medications for this visit. (Ophthalmic Drugs)   Current Outpatient Medications (Other)  Medication Sig   acetaminophen (TYLENOL) 500 MG tablet Take 500 mg by mouth daily as needed for fever or headache (pain).   albuterol (VENTOLIN HFA) 108 (90 Base) MCG/ACT inhaler Inhale 2 puffs into the lungs every 6 (six) hours as needed for wheezing or shortness of breath.   atorvastatin (LIPITOR) 40 MG tablet TAKE 1 TABLET BY MOUTH EVERY DAY   cetirizine (ZYRTEC) 10 MG tablet Take 1 tablet (10 mg total) by mouth daily. (Patient taking differently: Take 10 mg by mouth in the morning.)   diclofenac Sodium (VOLTAREN) 1 % GEL Apply 2 g topically 4 (four) times daily.  (Patient taking differently: Apply 2 g topically 4 (four) times daily as needed (pain).)   famotidine (PEPCID) 20 MG tablet TAKE 1 TABLET BY MOUTH EVERYDAY AT BEDTIME (Patient taking differently: Take 20 mg by mouth at bedtime.)   fluticasone (FLONASE) 50 MCG/ACT nasal spray PLACE 1 SPRAY INTO BOTH NOSTRILS DAILY AS NEEDED FOR ALLERGIES OR RHINITIS.   fluticasone-salmeterol (ADVAIR DISKUS) 500-50 MCG/ACT AEPB Use daily and as needed for shortness of breath symptoms. (Patient taking differently: Inhale 1 puff into the lungs 2 (two) times daily.)   iron polysaccharides (NIFEREX) 150 MG capsule TAKE 1 CAPSULE (150 MG TOTAL) BY MOUTH EVERY MONDAY, WEDNESDAY, AND FRIDAY.   metFORMIN (GLUCOPHAGE) 500 MG tablet TAKE 1 TABLET BY MOUTH 2 TIMES DAILY WITH A MEAL. (Patient taking differently: Take 500 mg by mouth 2 (two) times daily with a meal.)   mometasone (ELOCON) 0.1 % cream Apply 1 application topically as directed. Bid to aa Right thigh for 2 weeks, then decrease to qd 5 days a week (Patient taking differently: Apply 1 application  topically See admin instructions. Apply topically 5 days a week)   montelukast (SINGULAIR) 10 MG tablet TAKE 1 TABLET BY MOUTH EVERYDAY AT BEDTIME   oxybutynin (DITROPAN-XL) 5 MG 24 hr tablet TAKE 1 TABLET BY MOUTH TWICE A DAY (Patient taking differently: Take 5 mg by mouth 2 (two) times daily.)  pantoprazole (PROTONIX) 40 MG tablet Take one tablet daily (Patient taking differently: Take 40 mg by mouth in the morning.)   pimecrolimus (ELIDEL) 1 % cream Apply topically 2 (two) times daily.   polyethylene glycol (MIRALAX / GLYCOLAX) packet TAKE 17 GRAMS MIXED IN LIQUID EVERY DAY (Patient taking differently: Take 17 g by mouth daily as needed (constipation).)   No current facility-administered medications for this visit. (Other)   REVIEW OF SYSTEMS: ROS   Positive for: Endocrine, Eyes Negative for: Constitutional, Gastrointestinal, Neurological, Skin, Genitourinary,  Musculoskeletal, HENT, Cardiovascular, Respiratory, Psychiatric, Allergic/Imm, Heme/Lymph Last edited by Laddie Aquas, COA on 08/06/2022  2:27 PM.     ALLERGIES Allergies  Allergen Reactions   Tomato Itching and Rash   PAST MEDICAL HISTORY Past Medical History:  Diagnosis Date   Asthma, chronic 03/30/2012   Essential hypertension, benign 03/30/2012   Hypertension    Seasonal allergies 03/30/2012   Stress incontinence, female 03/30/2012   Tobacco abuse 03/30/2012   Past Surgical History:  Procedure Laterality Date   BREAST BIOPSY Right 05/01/2022   [place] RT BREAST BX W LOC DEV 1ST LESION IMG BX SPEC US GUIDE 05/01/2022 [facility]    FAMILY HISTORY History reviewed. No pertinent family history.  SOCIAL HISTORY Social History   Tobacco Use   Smoking status: Every Day    Current packs/day: 0.75    Average packs/day: 0.8 packs/day for 20.0 years (15.0 ttl pk-yrs)    Types: Cigarettes   Smokeless tobacco: Never   Tobacco comments:    12 cigs per day  Vaping Use   Vaping status: Never Used  Substance Use Topics   Alcohol use: No    Alcohol/week: 0.0 standard drinks of alcohol   Drug use: No       OPHTHALMIC EXAM:  Base Eye Exam     Visual Acuity (Snellen - Linear)       Right Left   Dist [place] 20/25 -2 20/25 -2   Dist ph  20/20 20/20         Tonometry (Tonopen, 2:25 PM)       Right Left   Pressure 19 20         Pupils       Dark Light Shape React APD   Right 3 2 Round Brisk None   Left 3 2 Round Brisk None         Visual Fields (Counting fingers)       Left Right    Full Full         Extraocular Movement       Right Left    Full, Ortho Full, Ortho         Neuro/Psych     Oriented x3: Yes   Mood/Affect: Normal         Dilation     Both eyes: 1.0% Mydriacyl, 2.5% Phenylephrine @ 2:25 PM           Slit Lamp and Fundus Exam     Slit Lamp Exam       Right Left   Lids/Lashes dermatochalasis dermatochalasis    Conjunctiva/Sclera mild melanosis, nasal pingeucula mild melanosis   Cornea mild arcus mild arcus   Anterior Chamber deep and clear, narrow temporal angle deep and clear, narrow temporal angle   Iris round and dilated, no NVI round and dilated, no NVI   Lens 2+ NS, 2+CS 2+ NS, 2-3+CS   Anterior Vitreous mld syneresis mld syneresis  Fundus Exam       Right Left   Disc pink and sharp pink and sharp   C/D Ratio 0.2 0.2   Macula flat, good foveal reflex, No heme or edema Flat, good foveal reflex, no heme or edema   Vessels attenuated, tortuous, AV crossing changes, copper wiring attenuated, tortuous, AV crossing changes, copper wiring   Periphery attached, no heme -- stably resolved attached, no heme -- stably resolved           IMAGING AND PROCEDURES  Imaging and Procedures for 08/06/2022  OCT, Retina - OU - Both Eyes       Right Eye Quality was good. Central Foveal Thickness: 243. Progression has been stable. Findings include normal foveal contour, no IRF, no SRF.   Left Eye Quality was good. Central Foveal Thickness: 243. Progression has been stable. Findings include normal foveal contour, no IRF, no SRF.   Notes *Images captured and stored on drive  Diagnosis / Impression:  NFP, no IRF/SRF OU  Clinical management:  See below  Abbreviations: NFP - Normal foveal profile. CME - cystoid macular edema. PED - pigment epithelial detachment. IRF - intraretinal fluid. SRF - subretinal fluid. EZ - ellipsoid zone. ERM - epiretinal membrane. ORA - outer retinal atrophy. ORT - outer retinal tubulation. SRHM - subretinal hyper-reflective material. IRHM - intraretinal hyper-reflective material            ASSESSMENT/PLAN:    ICD-10-CM   1. Moderate nonproliferative diabetic retinopathy of both eyes without macular edema associated with type 2 diabetes mellitus (HCC)  E11.3393 OCT, Retina - OU - Both Eyes    2. Diabetes mellitus treated with oral medication (HCC)  E11.9     Z79.84     3. Essential hypertension  I10     4. Retinal hemorrhage of both eyes  H35.63     5. Hypertensive retinopathy of both eyes  H35.033     6. Combined forms of age-related cataract of both eyes  H25.813      1,2. Moderate nonproliferative diabetic retinopathy w/o DME, both eyes - FA 12.20.23 shows moderate NPDR OU without macular edema, late phase shows mild temporal leakage OU - exam today shows stable improvement in retinal hemorrhages OU - OCT no diabetic macular edema, both eyes  - no retinal or ophthalmic interventions indicated or recommended  - f/u in 12 mos -- DFE/OCT  3,4. Hypertensive retinopathy OU - discussed importance of tight BP control - monitor  5. Retinal hemorrhages / Lucina Mellow Spots OU -- resolved  - dilated exam 12.2.23 showed white centered retinal hemorrhages OU, greatest temporally  - white-centered hemorrhages (Roth Spots) can be seen in a variety of systemic conditions w/ a wide differential that includes:  - Subacute bacterial endocarditis, Leukemia, Myeloma, Anemia, Anoxia, Carbon monoxide poisoning, Hypertension, Diabetic retinopathy, HIV retinopathy, Vitamin B12 deficiency, Intracranial hemorrhage from arteriovenous malformation or aneurysm - pt was referred to PCP for lab work which showed Hgb of 4.4 -- pt was admitted and transfused with 3 units of pRBCs - on exam today, retinal hemorrhages and Roth spots stably resolved - no retinal or ophthalmic interventions indicated or recommended at this time - monitor  6. Age related cataracts OU  - The symptoms of cataract, surgical options, and treatments and risks were discussed with patient. - discussed diagnosis and progression - monitor  Ophthalmic Meds Ordered this visit:  No orders of the defined types were placed in this encounter.    Return in about 1 year (  around 08/06/2023) for f/u NPDR OU, DFE, OCT.  There are no Patient Instructions on file for this visit.   Explained the  diagnoses, plan, and follow up with the patient and they expressed understanding.  Patient expressed understanding of the importance of proper follow up care.   This document serves as a record of services personally performed by Karie Chimera, MD, PhD. It was created on their behalf by Gerilyn Nestle, COT an ophthalmic technician. The creation of this record is the provider's dictation and/or activities during the visit.    Electronically signed by:  Charlette Caffey, COT  08/09/22 2:06 AM  This document serves as a record of services personally performed by Karie Chimera, MD, PhD. It was created on their behalf by Glee Arvin. Manson Passey, OA an ophthalmic technician. The creation of this record is the provider's dictation and/or activities during the visit.    Electronically signed by: Glee Arvin. Manson Passey, OA 08/09/22 2:06 AM  Karie Chimera, M.D., Ph.D. Diseases & Surgery of the Retina and Vitreous [facility]  I have reviewed the above documentation for accuracy and completeness, and I agree with the above. Karie Chimera, M.D., Ph.D. 08/09/22 2:08 AM   Abbreviations: M myopia (nearsighted); A astigmatism; H hyperopia (farsighted); P presbyopia; Mrx spectacle prescription;  CTL contact lenses; OD right eye; OS left eye; OU both eyes  XT exotropia; ET esotropia; PEK punctate epithelial keratitis; PEE punctate epithelial erosions; DES dry eye syndrome; MGD meibomian gland dysfunction; ATs artificial tears; PFAT's preservative free artificial tears; NSC nuclear sclerotic cataract; PSC posterior subcapsular cataract; ERM epi-retinal membrane; PVD posterior vitreous detachment; RD retinal detachment; DM diabetes mellitus; DR diabetic retinopathy; NPDR non-proliferative diabetic retinopathy; PDR proliferative diabetic retinopathy; CSME clinically significant macular edema; DME diabetic macular edema; dbh dot blot hemorrhages; CWS cotton wool spot; POAG primary open angle  glaucoma; C/D cup-to-disc ratio; HVF humphrey visual field; GVF goldmann visual field; OCT optical coherence tomography; IOP intraocular pressure; BRVO Branch retinal vein occlusion; CRVO central retinal vein occlusion; CRAO central retinal artery occlusion; BRAO branch retinal artery occlusion; RT retinal tear; SB scleral buckle; PPV pars plana vitrectomy; VH Vitreous hemorrhage; PRP panretinal laser photocoagulation; IVK intravitreal kenalog; VMT vitreomacular traction; MH Macular hole;  NVD neovascularization of the disc; NVE neovascularization elsewhere; AREDS age related eye disease study; ARMD age related macular degeneration; POAG primary open angle glaucoma; EBMD epithelial/anterior basement membrane dystrophy; ACIOL anterior chamber intraocular lens; IOL intraocular lens; PCIOL posterior chamber intraocular lens; Phaco/IOL phacoemulsification with intraocular lens placement; PRK photorefractive keratectomy; LASIK laser assisted in situ keratomileusis; HTN hypertension; DM diabetes mellitus; COPD chronic obstructive pulmonary disease

## 2022-08-06 ENCOUNTER — Ambulatory Visit (INDEPENDENT_AMBULATORY_CARE_PROVIDER_SITE_OTHER): Payer: Medicaid Other | Admitting: Ophthalmology

## 2022-08-06 ENCOUNTER — Encounter (INDEPENDENT_AMBULATORY_CARE_PROVIDER_SITE_OTHER): Payer: Self-pay | Admitting: Ophthalmology

## 2022-08-06 DIAGNOSIS — Z7984 Long term (current) use of oral hypoglycemic drugs: Secondary | ICD-10-CM | POA: Diagnosis not present

## 2022-08-06 DIAGNOSIS — E113393 Type 2 diabetes mellitus with moderate nonproliferative diabetic retinopathy without macular edema, bilateral: Secondary | ICD-10-CM | POA: Diagnosis not present

## 2022-08-06 DIAGNOSIS — E119 Type 2 diabetes mellitus without complications: Secondary | ICD-10-CM

## 2022-08-06 DIAGNOSIS — H25813 Combined forms of age-related cataract, bilateral: Secondary | ICD-10-CM

## 2022-08-06 DIAGNOSIS — I10 Essential (primary) hypertension: Secondary | ICD-10-CM | POA: Diagnosis not present

## 2022-08-06 DIAGNOSIS — H3563 Retinal hemorrhage, bilateral: Secondary | ICD-10-CM

## 2022-08-06 DIAGNOSIS — H35033 Hypertensive retinopathy, bilateral: Secondary | ICD-10-CM

## 2022-08-09 ENCOUNTER — Encounter (INDEPENDENT_AMBULATORY_CARE_PROVIDER_SITE_OTHER): Payer: Self-pay | Admitting: Ophthalmology

## 2022-08-12 ENCOUNTER — Other Ambulatory Visit: Payer: Self-pay | Admitting: Internal Medicine

## 2022-08-12 DIAGNOSIS — I10 Essential (primary) hypertension: Secondary | ICD-10-CM

## 2022-09-11 ENCOUNTER — Ambulatory Visit: Payer: Medicaid Other | Admitting: Dermatology

## 2022-09-14 ENCOUNTER — Other Ambulatory Visit: Payer: Self-pay | Admitting: Internal Medicine

## 2022-09-14 ENCOUNTER — Other Ambulatory Visit: Payer: Self-pay | Admitting: Student

## 2022-09-14 DIAGNOSIS — I10 Essential (primary) hypertension: Secondary | ICD-10-CM

## 2022-09-14 DIAGNOSIS — J4540 Moderate persistent asthma, uncomplicated: Secondary | ICD-10-CM

## 2022-09-23 ENCOUNTER — Ambulatory Visit: Payer: Medicaid Other | Admitting: Dermatology

## 2022-10-24 ENCOUNTER — Other Ambulatory Visit: Payer: Self-pay | Admitting: Student

## 2022-10-24 DIAGNOSIS — J4540 Moderate persistent asthma, uncomplicated: Secondary | ICD-10-CM

## 2022-12-07 ENCOUNTER — Other Ambulatory Visit: Payer: Self-pay | Admitting: Internal Medicine

## 2022-12-09 NOTE — Telephone Encounter (Signed)
Patient has transferred care to [facility].

## 2023-01-06 ENCOUNTER — Other Ambulatory Visit: Payer: Self-pay | Admitting: Student

## 2023-01-13 NOTE — Telephone Encounter (Signed)
No longer [facility] patient, patient transferred care.

## 2023-01-24 ENCOUNTER — Other Ambulatory Visit: Payer: Self-pay | Admitting: Internal Medicine

## 2023-01-24 ENCOUNTER — Other Ambulatory Visit: Payer: Self-pay | Admitting: Student

## 2023-01-24 DIAGNOSIS — E1165 Type 2 diabetes mellitus with hyperglycemia: Secondary | ICD-10-CM

## 2023-03-09 ENCOUNTER — Other Ambulatory Visit: Payer: Self-pay | Admitting: Internal Medicine

## 2023-04-09 ENCOUNTER — Other Ambulatory Visit: Payer: Self-pay | Admitting: Student

## 2023-04-09 DIAGNOSIS — E1165 Type 2 diabetes mellitus with hyperglycemia: Secondary | ICD-10-CM

## 2023-04-09 DIAGNOSIS — J4540 Moderate persistent asthma, uncomplicated: Secondary | ICD-10-CM

## 2023-04-14 ENCOUNTER — Other Ambulatory Visit: Payer: Self-pay | Admitting: Student

## 2023-04-14 DIAGNOSIS — J4540 Moderate persistent asthma, uncomplicated: Secondary | ICD-10-CM

## 2023-04-15 NOTE — Telephone Encounter (Signed)
 NO LONGER [facility] PATIENT

## 2023-05-20 ENCOUNTER — Other Ambulatory Visit: Payer: Self-pay | Admitting: Physician Assistant

## 2023-05-20 ENCOUNTER — Other Ambulatory Visit: Payer: Self-pay | Admitting: Student

## 2023-05-20 DIAGNOSIS — E1165 Type 2 diabetes mellitus with hyperglycemia: Secondary | ICD-10-CM

## 2023-05-20 DIAGNOSIS — Z1231 Encounter for screening mammogram for malignant neoplasm of breast: Secondary | ICD-10-CM

## 2023-05-20 NOTE — Telephone Encounter (Signed)
 NO LONGER [facility] PATIENT

## 2023-06-04 ENCOUNTER — Ambulatory Visit
Admission: RE | Admit: 2023-06-04 | Discharge: 2023-06-04 | Disposition: A | Discharge status: Home / Self Care | Source: Ambulatory Visit | Attending: Physician Assistant | Admitting: Physician Assistant

## 2023-06-04 DIAGNOSIS — Z1231 Encounter for screening mammogram for malignant neoplasm of breast: Secondary | ICD-10-CM

## 2023-07-28 NOTE — Progress Notes (Shared)
 [facility] - Clinic Note  08/11/2023     CHIEF COMPLAINT Patient presents for No chief complaint on file.   HISTORY OF PRESENT ILLNESS: Crystal Briggs is a 58 y.o. female who presents to the clinic today for:    Pt states her blood counts are good, she is not having any problems, she states her new dr is in the process of working on her and trying to figure out where she was losing blood from, her blood sugar numbers are good also  Referring physician: Tobie Emmy POUR, NP [address],  [place] [address]  HISTORICAL INFORMATION:   Selected notes from the MEDICAL RECORD NUMBER Referred by Dr. GORMAN Gaudy for intraretinal hemorrhages OU LEE:  Ocular Hx- PMH-    CURRENT MEDICATIONS: No current outpatient medications on file. (Ophthalmic Drugs)   No current facility-administered medications for this visit. (Ophthalmic Drugs)   Current Outpatient Medications (Other)  Medication Sig   acetaminophen  (TYLENOL ) 500 MG tablet Take 500 mg by mouth daily as needed for fever or headache (pain).   albuterol  (VENTOLIN  HFA) 108 (90 Base) MCG/ACT inhaler Inhale 2 puffs into the lungs every 6 (six) hours as needed for wheezing or shortness of breath.   atorvastatin  (LIPITOR) 40 MG tablet TAKE 1 TABLET BY MOUTH EVERY DAY   cetirizine  (ZYRTEC ) 10 MG tablet Take 1 tablet (10 mg total) by mouth daily. (Patient taking differently: Take 10 mg by mouth in the morning.)   diclofenac  Sodium (VOLTAREN ) 1 % GEL Apply 2 g topically 4 (four) times daily. (Patient taking differently: Apply 2 g topically 4 (four) times daily as needed (pain).)   famotidine  (PEPCID ) 20 MG tablet TAKE 1 TABLET BY MOUTH EVERYDAY AT BEDTIME (Patient taking differently: Take 20 mg by mouth at bedtime.)   fluticasone  (FLONASE ) 50 MCG/ACT nasal spray PLACE 1 SPRAY INTO BOTH NOSTRILS DAILY AS NEEDED FOR ALLERGIES OR RHINITIS.   fluticasone -salmeterol (ADVAIR DISKUS) 500-50 MCG/ACT AEPB Use  daily and as needed for shortness of breath symptoms. (Patient taking differently: Inhale 1 puff into the lungs 2 (two) times daily.)   iron  polysaccharides (NIFEREX) 150 MG capsule TAKE 1 CAPSULE (150 MG TOTAL) BY MOUTH EVERY MONDAY, WEDNESDAY, AND FRIDAY.   metFORMIN  (GLUCOPHAGE ) 500 MG tablet TAKE 1 TABLET BY MOUTH 2 TIMES DAILY WITH A MEAL. (Patient taking differently: Take 500 mg by mouth 2 (two) times daily with a meal.)   mometasone  (ELOCON ) 0.1 % cream Apply 1 application topically as directed. Bid to aa Right thigh for 2 weeks, then decrease to qd 5 days a week (Patient taking differently: Apply 1 application  topically See admin instructions. Apply topically 5 days a week)   montelukast  (SINGULAIR ) 10 MG tablet TAKE 1 TABLET BY MOUTH EVERYDAY AT BEDTIME   oxybutynin  (DITROPAN -XL) 5 MG 24 hr tablet TAKE 1 TABLET BY MOUTH TWICE A DAY (Patient taking differently: Take 5 mg by mouth 2 (two) times daily.)   pantoprazole  (PROTONIX ) 40 MG tablet Take one tablet daily (Patient taking differently: Take 40 mg by mouth in the morning.)   pimecrolimus  (ELIDEL ) 1 % cream Apply topically 2 (two) times daily.   polyethylene glycol (MIRALAX  / GLYCOLAX ) packet TAKE 17 GRAMS MIXED IN LIQUID EVERY DAY (Patient taking differently: Take 17 g by mouth daily as needed (constipation).)   No current facility-administered medications for this visit. (Other)   REVIEW OF SYSTEMS:   ALLERGIES Allergies  Allergen Reactions   Tomato Itching and  Rash   PAST MEDICAL HISTORY Past Medical History:  Diagnosis Date   Asthma, chronic 03/30/2012   Essential hypertension, benign 03/30/2012   Hypertension    Seasonal allergies 03/30/2012   Stress incontinence, female 03/30/2012   Tobacco abuse 03/30/2012   Past Surgical History:  Procedure Laterality Date   BREAST BIOPSY Right 05/01/2022   US  RT BREAST BX W LOC DEV 1ST LESION IMG BX SPEC US  GUIDE 05/01/2022 [facility]    FAMILY HISTORY No family history on  file.  SOCIAL HISTORY Social History   Tobacco Use   Smoking status: Every Day    Current packs/day: 0.75    Average packs/day: 0.8 packs/day for 20.0 years (15.0 ttl pk-yrs)    Types: Cigarettes   Smokeless tobacco: Never   Tobacco comments:    12 cigs per day  Vaping Use   Vaping status: Never Used  Substance Use Topics   Alcohol use: No    Alcohol/week: 0.0 standard drinks of alcohol   Drug use: No       OPHTHALMIC EXAM:  Not recorded    IMAGING AND PROCEDURES  Imaging and Procedures for 08/11/2023          ASSESSMENT/PLAN:    ICD-10-CM   1. Moderate nonproliferative diabetic retinopathy of both eyes without macular edema associated with type 2 diabetes mellitus (HCC)  Z88.6606     2. Diabetes mellitus treated with oral medication (HCC)  E11.9    Z79.84     3. Essential hypertension  I10     4. Retinal hemorrhage of both eyes  H35.63     5. Hypertensive retinopathy of both eyes  H35.033     6. Combined forms of age-related cataract of both eyes  H25.813       1,2. Moderate nonproliferative diabetic retinopathy w/o DME, both eyes - FA 12.20.23 shows moderate NPDR OU without macular edema, late phase shows mild temporal leakage OU - exam today shows stable improvement in retinal hemorrhages OU - OCT no diabetic macular edema, both eyes  - no retinal or ophthalmic interventions indicated or recommended  - f/u in 12 mos -- DFE/OCT  3,4. Hypertensive retinopathy OU - discussed importance of tight BP control - monitor  5. Retinal hemorrhages / Madelyn Spots OU -- resolved  - dilated exam 12.2.23 showed white centered retinal hemorrhages OU, greatest temporally  - white-centered hemorrhages (Roth Spots) can be seen in a variety of systemic conditions w/ a wide differential that includes:  - Subacute bacterial endocarditis, Leukemia, Myeloma, Anemia, Anoxia, Carbon monoxide poisoning, Hypertension, Diabetic retinopathy, HIV retinopathy, Vitamin B12  deficiency, Intracranial hemorrhage from arteriovenous malformation or aneurysm - pt was referred to PCP for lab work which showed Hgb of 4.4 -- pt was admitted and transfused with 3 units of pRBCs - on exam today, retinal hemorrhages and Roth spots stably resolved - no retinal or ophthalmic interventions indicated or recommended at this time - monitor  6. Age related cataracts OU  - The symptoms of cataract, surgical options, and treatments and risks were discussed with patient. - discussed diagnosis and progression - monitor  Ophthalmic Meds Ordered this visit:  No orders of the defined types were placed in this encounter.    No follow-ups on file.  There are no Patient Instructions on file for this visit.   Explained the diagnoses, plan, and follow up with the patient and they expressed understanding.  Patient expressed understanding of the importance of proper follow up care.   This  document serves as a record of services personally performed by Redell JUDITHANN Hans, MD, PhD. It was created on their behalf by Avelina Pereyra, COA an ophthalmic technician. The creation of this record is the provider's dictation and/or activities during the visit.   Electronically signed by: Avelina GORMAN Pereyra, COT  07/28/23  11:53 AM    Redell JUDITHANN Hans, M.D., Ph.D. Diseases & Surgery of the Retina and Vitreous [facility]     Abbreviations: M myopia (nearsighted); A astigmatism; H hyperopia (farsighted); P presbyopia; Mrx spectacle prescription;  CTL contact lenses; OD right eye; OS left eye; OU both eyes  XT exotropia; ET esotropia; PEK punctate epithelial keratitis; PEE punctate epithelial erosions; DES dry eye syndrome; MGD meibomian gland dysfunction; ATs artificial tears; PFAT's preservative free artificial tears; NSC nuclear sclerotic cataract; PSC posterior subcapsular cataract; ERM epi-retinal membrane; PVD posterior vitreous detachment; RD retinal detachment; DM diabetes  mellitus; DR diabetic retinopathy; NPDR non-proliferative diabetic retinopathy; PDR proliferative diabetic retinopathy; CSME clinically significant macular edema; DME diabetic macular edema; dbh dot blot hemorrhages; CWS cotton wool spot; POAG primary open angle glaucoma; C/D cup-to-disc ratio; HVF humphrey visual field; GVF goldmann visual field; OCT optical coherence tomography; IOP intraocular pressure; BRVO Branch retinal vein occlusion; CRVO central retinal vein occlusion; CRAO central retinal artery occlusion; BRAO branch retinal artery occlusion; RT retinal tear; SB scleral buckle; PPV pars plana vitrectomy; VH Vitreous hemorrhage; PRP panretinal laser photocoagulation; IVK intravitreal kenalog; VMT vitreomacular traction; MH Macular hole;  NVD neovascularization of the disc; NVE neovascularization elsewhere; AREDS age related eye disease study; ARMD age related macular degeneration; POAG primary open angle glaucoma; EBMD epithelial/anterior basement membrane dystrophy; ACIOL anterior chamber intraocular lens; IOL intraocular lens; PCIOL posterior chamber intraocular lens; Phaco/IOL phacoemulsification with intraocular lens placement; PRK photorefractive keratectomy; LASIK laser assisted in situ keratomileusis; HTN hypertension; DM diabetes mellitus; COPD chronic obstructive pulmonary disease

## 2023-08-11 ENCOUNTER — Encounter (INDEPENDENT_AMBULATORY_CARE_PROVIDER_SITE_OTHER): Payer: Medicaid Other | Admitting: Ophthalmology

## 2023-08-11 DIAGNOSIS — I10 Essential (primary) hypertension: Secondary | ICD-10-CM

## 2023-08-11 DIAGNOSIS — H3563 Retinal hemorrhage, bilateral: Secondary | ICD-10-CM

## 2023-08-11 DIAGNOSIS — E113393 Type 2 diabetes mellitus with moderate nonproliferative diabetic retinopathy without macular edema, bilateral: Secondary | ICD-10-CM

## 2023-08-11 DIAGNOSIS — H25813 Combined forms of age-related cataract, bilateral: Secondary | ICD-10-CM

## 2023-08-11 DIAGNOSIS — E119 Type 2 diabetes mellitus without complications: Secondary | ICD-10-CM

## 2023-08-11 DIAGNOSIS — H35033 Hypertensive retinopathy, bilateral: Secondary | ICD-10-CM
